# Patient Record
Sex: Female | Born: 1940 | Race: White | Hispanic: No | Smoking: Never smoker
Health system: Southern US, Community
[De-identification: ages and names within clinical notes are randomized; demographics above are authoritative.]

## PROBLEM LIST (undated history)

## (undated) DIAGNOSIS — B029 Zoster without complications: Secondary | ICD-10-CM

## (undated) DIAGNOSIS — M1710 Unilateral primary osteoarthritis, unspecified knee: Secondary | ICD-10-CM

## (undated) DIAGNOSIS — K649 Unspecified hemorrhoids: Secondary | ICD-10-CM

## (undated) DIAGNOSIS — J309 Allergic rhinitis, unspecified: Secondary | ICD-10-CM

## (undated) DIAGNOSIS — D126 Benign neoplasm of colon, unspecified: Secondary | ICD-10-CM

## (undated) DIAGNOSIS — F411 Generalized anxiety disorder: Secondary | ICD-10-CM

## (undated) DIAGNOSIS — H9190 Unspecified hearing loss, unspecified ear: Secondary | ICD-10-CM

## (undated) DIAGNOSIS — D649 Anemia, unspecified: Secondary | ICD-10-CM

## (undated) DIAGNOSIS — F0390 Unspecified dementia without behavioral disturbance: Secondary | ICD-10-CM

## (undated) DIAGNOSIS — F1010 Alcohol abuse, uncomplicated: Secondary | ICD-10-CM

## (undated) DIAGNOSIS — K5790 Diverticulosis of intestine, part unspecified, without perforation or abscess without bleeding: Secondary | ICD-10-CM

## (undated) DIAGNOSIS — E538 Deficiency of other specified B group vitamins: Secondary | ICD-10-CM

## (undated) DIAGNOSIS — E785 Hyperlipidemia, unspecified: Secondary | ICD-10-CM

## (undated) DIAGNOSIS — N6019 Diffuse cystic mastopathy of unspecified breast: Secondary | ICD-10-CM

## (undated) DIAGNOSIS — M353 Polymyalgia rheumatica: Secondary | ICD-10-CM

## (undated) DIAGNOSIS — I10 Essential (primary) hypertension: Secondary | ICD-10-CM

## (undated) DIAGNOSIS — M810 Age-related osteoporosis without current pathological fracture: Secondary | ICD-10-CM

## (undated) DIAGNOSIS — IMO0002 Reserved for concepts with insufficient information to code with codable children: Secondary | ICD-10-CM

## (undated) HISTORY — DX: Diverticulosis of intestine, part unspecified, without perforation or abscess without bleeding: K57.90

## (undated) HISTORY — DX: Unspecified dementia without behavioral disturbance: F03.90

## (undated) HISTORY — DX: Unilateral primary osteoarthritis, unspecified knee: M17.10

## (undated) HISTORY — DX: Benign neoplasm of colon, unspecified: D12.6

## (undated) HISTORY — DX: Deficiency of other specified B group vitamins: E53.8

## (undated) HISTORY — DX: Zoster without complications: B02.9

## (undated) HISTORY — PX: OTHER SURGICAL HISTORY: SHX169

## (undated) HISTORY — DX: Reserved for concepts with insufficient information to code with codable children: IMO0002

## (undated) HISTORY — DX: Generalized anxiety disorder: F41.1

## (undated) HISTORY — DX: Allergic rhinitis, unspecified: J30.9

## (undated) HISTORY — DX: Unspecified hemorrhoids: K64.9

## (undated) HISTORY — DX: Diffuse cystic mastopathy of unspecified breast: N60.19

## (undated) HISTORY — DX: Age-related osteoporosis without current pathological fracture: M81.0

## (undated) HISTORY — DX: Unspecified hearing loss, unspecified ear: H91.90

## (undated) HISTORY — DX: Hyperlipidemia, unspecified: E78.5

## (undated) HISTORY — DX: Polymyalgia rheumatica: M35.3

---

## 1991-07-19 HISTORY — PX: OTHER SURGICAL HISTORY: SHX169

## 1999-05-26 DIAGNOSIS — D126 Benign neoplasm of colon, unspecified: Secondary | ICD-10-CM

## 1999-05-26 HISTORY — DX: Benign neoplasm of colon, unspecified: D12.6

## 2002-02-18 ENCOUNTER — Other Ambulatory Visit: Admission: RE | Admit: 2002-02-18 | Discharge: 2002-02-18 | Payer: Self-pay | Admitting: Obstetrics and Gynecology

## 2005-04-01 ENCOUNTER — Other Ambulatory Visit: Admission: RE | Admit: 2005-04-01 | Discharge: 2005-04-01 | Payer: Self-pay | Admitting: Family Medicine

## 2005-04-15 ENCOUNTER — Ambulatory Visit: Payer: Self-pay | Admitting: Emergency Medicine

## 2005-04-23 ENCOUNTER — Ambulatory Visit: Payer: Self-pay | Admitting: Emergency Medicine

## 2005-05-19 ENCOUNTER — Ambulatory Visit: Payer: Self-pay | Admitting: Emergency Medicine

## 2005-05-24 ENCOUNTER — Ambulatory Visit: Payer: Self-pay | Admitting: Cardiology

## 2005-06-14 ENCOUNTER — Ambulatory Visit: Payer: Self-pay | Admitting: Emergency Medicine

## 2005-07-21 ENCOUNTER — Ambulatory Visit: Payer: Self-pay | Admitting: Emergency Medicine

## 2005-08-03 ENCOUNTER — Ambulatory Visit: Payer: Self-pay | Admitting: Emergency Medicine

## 2005-08-04 ENCOUNTER — Ambulatory Visit: Payer: Self-pay | Admitting: Cardiology

## 2005-08-29 ENCOUNTER — Ambulatory Visit: Payer: Self-pay | Admitting: Internal Medicine

## 2005-09-19 ENCOUNTER — Encounter: Admission: RE | Admit: 2005-09-19 | Discharge: 2005-09-19 | Payer: Self-pay | Admitting: Internal Medicine

## 2006-03-09 ENCOUNTER — Ambulatory Visit: Payer: Self-pay | Admitting: Emergency Medicine

## 2006-04-11 ENCOUNTER — Encounter (INDEPENDENT_AMBULATORY_CARE_PROVIDER_SITE_OTHER): Payer: Self-pay | Admitting: *Deleted

## 2006-04-11 ENCOUNTER — Ambulatory Visit (HOSPITAL_COMMUNITY): Admission: RE | Admit: 2006-04-11 | Discharge: 2006-04-11 | Payer: Self-pay | Admitting: Obstetrics and Gynecology

## 2006-05-02 ENCOUNTER — Other Ambulatory Visit: Admission: RE | Admit: 2006-05-02 | Discharge: 2006-05-02 | Payer: Self-pay | Admitting: Obstetrics and Gynecology

## 2007-02-14 ENCOUNTER — Ambulatory Visit: Payer: Self-pay | Admitting: Emergency Medicine

## 2007-04-06 DIAGNOSIS — J309 Allergic rhinitis, unspecified: Secondary | ICD-10-CM | POA: Insufficient documentation

## 2007-04-06 DIAGNOSIS — N6019 Diffuse cystic mastopathy of unspecified breast: Secondary | ICD-10-CM | POA: Insufficient documentation

## 2007-04-06 DIAGNOSIS — Z9889 Other specified postprocedural states: Secondary | ICD-10-CM | POA: Insufficient documentation

## 2007-04-06 DIAGNOSIS — M810 Age-related osteoporosis without current pathological fracture: Secondary | ICD-10-CM | POA: Insufficient documentation

## 2007-04-12 ENCOUNTER — Ambulatory Visit: Payer: Self-pay | Admitting: Emergency Medicine

## 2007-05-22 ENCOUNTER — Ambulatory Visit: Payer: Self-pay | Admitting: Pulmonary Disease

## 2007-09-07 ENCOUNTER — Ambulatory Visit: Payer: Self-pay | Admitting: Critical Care Medicine

## 2007-09-07 ENCOUNTER — Telehealth (INDEPENDENT_AMBULATORY_CARE_PROVIDER_SITE_OTHER): Payer: Self-pay | Admitting: *Deleted

## 2007-09-07 ENCOUNTER — Telehealth: Payer: Self-pay | Admitting: Emergency Medicine

## 2007-09-10 ENCOUNTER — Ambulatory Visit: Payer: Self-pay | Admitting: Emergency Medicine

## 2007-09-10 DIAGNOSIS — B029 Zoster without complications: Secondary | ICD-10-CM | POA: Insufficient documentation

## 2007-09-24 ENCOUNTER — Ambulatory Visit: Payer: Self-pay | Admitting: Emergency Medicine

## 2007-09-25 ENCOUNTER — Ambulatory Visit (HOSPITAL_COMMUNITY): Admission: RE | Admit: 2007-09-25 | Discharge: 2007-09-25 | Payer: Self-pay | Admitting: Emergency Medicine

## 2007-10-02 ENCOUNTER — Ambulatory Visit: Payer: Self-pay | Admitting: Emergency Medicine

## 2007-10-04 DIAGNOSIS — J209 Acute bronchitis, unspecified: Secondary | ICD-10-CM | POA: Insufficient documentation

## 2007-10-18 ENCOUNTER — Ambulatory Visit: Payer: Self-pay | Admitting: Emergency Medicine

## 2007-10-18 DIAGNOSIS — J479 Bronchiectasis, uncomplicated: Secondary | ICD-10-CM | POA: Insufficient documentation

## 2007-10-19 ENCOUNTER — Telehealth: Payer: Self-pay | Admitting: Emergency Medicine

## 2007-11-19 ENCOUNTER — Ambulatory Visit: Payer: Self-pay | Admitting: Emergency Medicine

## 2008-03-21 ENCOUNTER — Ambulatory Visit: Payer: Self-pay | Admitting: Emergency Medicine

## 2008-04-16 ENCOUNTER — Telehealth (INDEPENDENT_AMBULATORY_CARE_PROVIDER_SITE_OTHER): Payer: Self-pay | Admitting: *Deleted

## 2009-03-06 ENCOUNTER — Encounter (INDEPENDENT_AMBULATORY_CARE_PROVIDER_SITE_OTHER): Payer: Self-pay | Admitting: *Deleted

## 2009-06-15 ENCOUNTER — Ambulatory Visit: Payer: Self-pay | Admitting: Emergency Medicine

## 2009-06-19 ENCOUNTER — Telehealth: Payer: Self-pay | Admitting: Internal Medicine

## 2009-06-23 ENCOUNTER — Telehealth: Payer: Self-pay | Admitting: Emergency Medicine

## 2009-06-30 ENCOUNTER — Ambulatory Visit: Payer: Self-pay | Admitting: Emergency Medicine

## 2009-06-30 DIAGNOSIS — J069 Acute upper respiratory infection, unspecified: Secondary | ICD-10-CM | POA: Insufficient documentation

## 2009-08-20 ENCOUNTER — Ambulatory Visit: Payer: Self-pay | Admitting: Internal Medicine

## 2009-08-20 DIAGNOSIS — IMO0002 Reserved for concepts with insufficient information to code with codable children: Secondary | ICD-10-CM | POA: Insufficient documentation

## 2009-08-20 DIAGNOSIS — M1710 Unilateral primary osteoarthritis, unspecified knee: Secondary | ICD-10-CM

## 2009-08-20 DIAGNOSIS — H9190 Unspecified hearing loss, unspecified ear: Secondary | ICD-10-CM | POA: Insufficient documentation

## 2009-08-22 ENCOUNTER — Ambulatory Visit: Payer: Self-pay | Admitting: Family Medicine

## 2009-08-22 DIAGNOSIS — T887XXA Unspecified adverse effect of drug or medicament, initial encounter: Secondary | ICD-10-CM | POA: Insufficient documentation

## 2009-11-19 ENCOUNTER — Ambulatory Visit: Payer: Self-pay | Admitting: Internal Medicine

## 2009-11-19 DIAGNOSIS — L910 Hypertrophic scar: Secondary | ICD-10-CM | POA: Insufficient documentation

## 2009-11-20 LAB — CONVERTED CEMR LAB
ALT: 17 units/L (ref 0–35)
AST: 19 units/L (ref 0–37)
Albumin: 4 g/dL (ref 3.5–5.2)
Alkaline Phosphatase: 65 units/L (ref 39–117)
BUN: 11 mg/dL (ref 6–23)
Basophils Absolute: 0.1 10*3/uL (ref 0.0–0.1)
Bilirubin Urine: NEGATIVE
Bilirubin, Direct: 0.1 mg/dL (ref 0.0–0.3)
CO2: 31 meq/L (ref 19–32)
Calcium: 9.3 mg/dL (ref 8.4–10.5)
Chloride: 102 meq/L (ref 96–112)
Cholesterol: 314 mg/dL — ABNORMAL HIGH (ref 0–200)
Creatinine, Ser: 0.6 mg/dL (ref 0.4–1.2)
Direct LDL: 203.1 mg/dL
Eosinophils Absolute: 0.2 10*3/uL (ref 0.0–0.7)
Eosinophils Relative: 4.2 % (ref 0.0–5.0)
GFR calc non Af Amer: 105.94 mL/min (ref >60)
Glucose, Bld: 115 mg/dL — ABNORMAL HIGH (ref 70–99)
HCT: 39 % (ref 36.0–46.0)
HDL: 98.7 mg/dL (ref >39.00)
Hemoglobin: 13.7 g/dL (ref 12.0–15.0)
Ketones, ur: NEGATIVE mg/dL
Lymphs Abs: 1.4 10*3/uL (ref 0.7–4.0)
MCV: 87.4 fL (ref 78.0–100.0)
Monocytes Absolute: 0.3 10*3/uL (ref 0.1–1.0)
Monocytes Relative: 7.1 % (ref 3.0–12.0)
Neutro Abs: 2.6 10*3/uL (ref 1.4–7.7)
Neutrophils Relative %: 56.7 % (ref 43.0–77.0)
Nitrite: NEGATIVE
Platelets: 378 10*3/uL (ref 150.0–400.0)
Potassium: 4.7 meq/L (ref 3.5–5.1)
RDW: 14.4 % (ref 11.5–14.6)
Sodium: 141 meq/L (ref 135–145)
Specific Gravity, Urine: 1.01 (ref 1.000–1.030)
TSH: 1.74 microintl units/mL (ref 0.35–5.50)
Total Bilirubin: 0.6 mg/dL (ref 0.3–1.2)
Total CHOL/HDL Ratio: 3
Total Protein, Urine: NEGATIVE mg/dL
Total Protein: 7.2 g/dL (ref 6.0–8.3)
Triglycerides: 65 mg/dL (ref 0.0–149.0)
VLDL: 13 mg/dL (ref 0.0–40.0)
WBC: 4.6 10*3/uL (ref 4.5–10.5)
pH: 7 (ref 5.0–8.0)

## 2009-12-01 ENCOUNTER — Telehealth (INDEPENDENT_AMBULATORY_CARE_PROVIDER_SITE_OTHER): Payer: Self-pay | Admitting: *Deleted

## 2009-12-08 ENCOUNTER — Ambulatory Visit: Payer: Self-pay | Admitting: Internal Medicine

## 2009-12-08 ENCOUNTER — Telehealth: Payer: Self-pay | Admitting: Internal Medicine

## 2009-12-08 DIAGNOSIS — E785 Hyperlipidemia, unspecified: Secondary | ICD-10-CM | POA: Insufficient documentation

## 2009-12-18 ENCOUNTER — Encounter: Payer: Self-pay | Admitting: Internal Medicine

## 2010-03-02 ENCOUNTER — Ambulatory Visit: Payer: Self-pay | Admitting: Internal Medicine

## 2010-03-02 DIAGNOSIS — K140 Glossitis: Secondary | ICD-10-CM | POA: Insufficient documentation

## 2010-03-02 LAB — CONVERTED CEMR LAB
Cholesterol: 290 mg/dL — ABNORMAL HIGH (ref 0–200)
Triglycerides: 67 mg/dL (ref 0.0–149.0)
VLDL: 13.4 mg/dL (ref 0.0–40.0)

## 2010-03-12 ENCOUNTER — Encounter: Payer: Self-pay | Admitting: Internal Medicine

## 2010-04-09 ENCOUNTER — Ambulatory Visit: Payer: Self-pay | Admitting: Internal Medicine

## 2010-04-09 DIAGNOSIS — N390 Urinary tract infection, site not specified: Secondary | ICD-10-CM | POA: Insufficient documentation

## 2010-04-09 LAB — CONVERTED CEMR LAB
Bilirubin Urine: NEGATIVE
Nitrite: POSITIVE
Protein, U semiquant: NEGATIVE

## 2010-04-13 ENCOUNTER — Telehealth: Payer: Self-pay | Admitting: Internal Medicine

## 2010-08-17 NOTE — Assessment & Plan Note (Signed)
Summary: knot on r leg/cd   Vital Signs:  Patient profile:   70 year old female Height:      67 inches (170.18 cm) Weight:      160.12 pounds (72.78 kg) O2 Sat:      96 % on Room air Temp:     98.2 degrees F (36.78 degrees C) oral Pulse rate:   70 / minute BP sitting:   120 / 82  (left arm) Cuff size:   regular  Vitals Entered By: Orlan Leavens (Dec 08, 2009 11:32 AM)  O2 Flow:  Room air CC: Knot on (R) leg Is Patient Diabetic? No Pain Assessment Patient in pain? no        Primary Care Provider:  Newt Lukes MD  CC:  Knot on (R) leg.  History of Present Illness: wants to review labs -  1) dyslipidemia - pt clarifies has been recommended to be on lipitor in past but never taken this or any statin med - recent labs reviewed including calc CHD risk - taking fish oil two times a day   2) arthritis -knee L>R still with swelling and pain L>R knee, esp while sitting prolnged car trip pain controlled with ASA as needed - improved when remembers to wear neoprene sleeve noted 2 days ago "knot" to lateral side of right knee after extertion - not red or painful, no injury - after night rest and sleep, "knot has resolved"  3) concerned re: new keloid formation and tenderness over left breast scar silicone breast implants removed almost 16yr ago redness and pain over scar new in past few weeks no fever, no drainage would like to be seen by breast specialist at [facility]  4) 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 in many mos - not needing rescue inhaler - follows with pulm- [facility] for same -annually  Clinical Review Panels:  Lipid Management   Cholesterol:  314 (11/19/2009)   HDL (good cholesterol):  98.70 (11/19/2009)   Current Medications (verified): 1)  Claritin 10 Mg  Tabs (Loratadine) .... As Needed 2)  Proventil Hfa 108 (90 Base) Mcg/act Aers (Albuterol Sulfate) .... 2 Puff Q4-6 Hours As Needed 3)  Mucinex Dm 30-600 Mg  Tb12 (Dextromethorphan-Guaifenesin) .Marland Kitchen.. 1-2  Tabs Every 12 Hours As Needed 4)  Aspirin 81 Mg Tabs (Aspirin) .... 2 By Mouth Once Daily 5)  Fish Oil 1200 Mg Caps (Omega-3 Fatty Acids) .Marland Kitchen.. 1 By Mouth Two Times A Day  Allergies (verified): 1)  ! Penicillin V Potassium (Penicillin V Potassium) 2)  ! Sulfazine (Sulfasalazine) 3)  ! * Pneumonia Vaccine  Past History:  Past Medical History: HERPES ZOSTER hx   BRONCHIECTASIS  HERNIORRHAPHY, HX OF  OSTEOPENIA  ALLERGIC RHINITIS Osteoarthritis, knees L>R   MD rooster: pulm - [facility]     Past Surgical History: bilaterally mastectomy while removing cosmetic silicone implants (NO breast cancer!)  bilateral silicone breast explant due to rupture/leak - 1993 at [facility]  Review of Systems  The patient denies fever, weight loss, chest pain, and headaches.    Physical Exam  General:  Well-developed,well-nourished,in no acute distress; alert,appropriate and cooperative throughout examination Breasts:  new keloid - bright red and tender to palp over lateral edge of left breast scar 6.5cm in length- no cellulitis, no mass, no fluctuence or drainage (B mastectomy) - remaining scar without inglammation on left side (midline medially) Lungs:  normal respiratory effort, no intercostal retractions or use of accessory muscles; normal breath sounds bilaterally - no crackles and  no wheezes.    Heart:  normal rate, regular rhythm, no murmur, and no rub. BLE without edema. Msk:  Left knee: decreased range of motion, diffuse boggy synovitis. Tender to palpation on joint line. Increased pain with weight bearing. Positive crepitus. right knee without palp knot or mass - FROM without reproduction of problem   Impression & Recommendations:  Problem # 1:  KELOID (ICD-701.4)  ?delayed silicone reaction (explant in 1993 but new keloid formation only 2-3 weeks ago) refer to breast surg for further eval - no evidence for cellulitis or active infx on exam referral to gen surg was cancelled by gen  surg on day prior to sched appt as felt pt should see plastic surg - plastics referral done, apt was for 12/16/09 with [facility]--- but pt has cancelled this and wishes to be seen at [facility] instead  addendum - see phone note - pt requests dr. Jamey Ripa -- i have called and discussed case directly with him - he agrees to further eval for same and will have his office arranged  Problem # 2:  DYSLIPIDEMIA (ICD-272.4)  labs and CHD risk reviewed - very high chol and LDL but high HDL and low TGs - calc to be less than avg risk for CHD depsite the high LDL/total - now on fish oil but would be willing to take lipitor if not improving - check FLP again 3 mos and initiate tx with lipitor if total/LDL not improved also to focus on diet  exercise in meanwhile - Time spent with patient 25 minutes, more than 50% of this time was spent counseling patient on surgical referral and mgmt of dyslipidemia   Labs Reviewed: SGOT: 19 (11/19/2009)   SGPT: 17 (11/19/2009)   HDL:98.70 (11/19/2009)  Chol:314 (11/19/2009)  Trig:65.0 (11/19/2009)  Complete Medication List: 1)  Claritin 10 Mg Tabs (Loratadine) .... As needed 2)  Proventil Hfa 108 (90 Base) Mcg/act Aers (Albuterol sulfate) .... 2 puff q4-6 hours as needed 3)  Mucinex Dm 30-600 Mg Tb12 (Dextromethorphan-guaifenesin) .Marland Kitchen.. 1-2 tabs every 12 hours as needed 4)  Aspirin 81 Mg Tabs (Aspirin) .... 2 by mouth once daily 5)  Fish Oil 1200 Mg Caps (Omega-3 fatty acids) .Marland Kitchen.. 1 by mouth two times a day  Patient Instructions: 1)  it was good to see you today.  2)  labs reviewed - will recheck cholesterol in mid-august - Please schedule a follow-up appointment at that time, sooner if problems. 3)  we'll make referral to [facility] for breast evaluation once we have reviewed our options. Our office will contact you regarding this appointment once made.

## 2010-08-17 NOTE — Therapy (Signed)
Summary: [facility]  [facility]   Imported By: Lennie Odor 03/25/2010 11:14:52  _____________________________________________________________________  External Attachment:    Type:   Image     Comment:   External Document

## 2010-08-17 NOTE — Consult Note (Signed)
Summary: [facility]   Imported By: Sherian Rein 12/30/2009 11:55:24  _____________________________________________________________________  External Attachment:    Type:   Image     Comment:   External Document

## 2010-08-17 NOTE — Assessment & Plan Note (Signed)
Summary: REACTION FROM PNEUMONIA SHOT/DLO   Vital Signs:  Patient profile:   70 year old female Weight:      160 pounds Temp:     97.7 degrees F oral BP sitting:   154 / 90  (right arm) Cuff size:   regular  Vitals Entered By: Alfred Levins, CMA (August 22, 2009 11:29 AM) CC: pt had pneumonia shot on thursday and her lt arm is red, swollen, and sore at injection area   History of Present Illness: Here for a reaction to a Pneumovax she received here 48 hours ago.  This was placed in the left upper arm. Last night she noticed some slight discomfort and redness in the left upper arm, and this area has increased in size this am. Interestingly this red area is a good 10 cm further down the arm distal to the injeciton site. She feels fine in general, no fever, etc.   Allergies: 1)  ! Penicillin V Potassium (Penicillin V Potassium) 2)  ! Sulfazine (Sulfasalazine) 3)  ! * Pneumonia Vaccine  Past History:  Past Medical History: Reviewed history from 08/20/2009 and no changes required. HERPES ZOSTER hx BRONCHIECTASIS  HERNIORRHAPHY, HX OF  OSTEOPENIA  ALLERGIC RHINITIS Osteoarthritis, knees L>R  MD rooster: pulm - [facility]    Review of Systems  The patient denies anorexia, fever, weight loss, weight gain, vision loss, decreased hearing, hoarseness, chest pain, syncope, dyspnea on exertion, peripheral edema, prolonged cough, headaches, hemoptysis, abdominal pain, melena, hematochezia, severe indigestion/heartburn, hematuria, incontinence, genital sores, muscle weakness, suspicious skin lesions, transient blindness, difficulty walking, depression, unusual weight change, abnormal bleeding, enlarged lymph nodes, angioedema, breast masses, and testicular masses.    Physical Exam  General:  Well-developed,well-nourished,in no acute distress; alert,appropriate and cooperative throughout examination Skin:  the left upper arm has an area of swelling, erythema, warmth, and tenderness just  above the elbow. The deltoid region is unaffected.  Axillary Nodes:  No palpable lymphadenopathy   Impression & Recommendations:  Problem # 1:  ADVERSE DRUG REACTION (ICD-995.20)  Complete Medication List: 1)  Claritin 10 Mg Tabs (Loratadine) .... As needed 2)  Proventil Hfa 108 (90 Base) Mcg/act Aers (Albuterol sulfate) .... 2 puff q4-6 hours as needed 3)  Mucinex Dm 30-600 Mg Tb12 (Dextromethorphan-guaifenesin) .Marland Kitchen.. 1-2 tabs every 12 hours as needed 4)  Aspirin 81 Mg Tabs (Aspirin) .... 2 by mouth once daily 5)  Doxycycline Hyclate 100 Mg Caps (Doxycycline hyclate) .... Two times a day  Patient Instructions: 1)  This seems to be an allergic reaction to the pneumonia vaccine (she has never had one before), but the location of the reaction indicates that the vaccine must have tracked down the arm from the injection site, possibly in between some muscle fiber layers. Advised her to take Benadryl and apply ice packs to the area. I don not think it is infected, but since this is a weekend i gave her a rx for Doxycycline to take home and hold onto. If the red area seems to increasing in size at all, she will begin taking the antibiotic. She will call if she has any questions.  Prescriptions: DOXYCYCLINE HYCLATE 100 MG CAPS (DOXYCYCLINE HYCLATE) two times a day  #14 x 0   Entered and Authorized by:   Nelwyn Salisbury MD   Signed by:   Nelwyn Salisbury MD on 08/22/2009   Method used:   Print then Give to Patient   RxID:   [id number]

## 2010-08-17 NOTE — Progress Notes (Signed)
Summary: UTI?  Phone Note Call from Patient Call back at [facility] Phone [phone]   Caller: Patient Summary of Call: Pt called stating that UTI sxs have improved but not resolved. Pt is requesting additional ABX. Would it be okay to refill Cipro? Initial call taken by: Margaret Pyle, CMA,  April 13, 2010 1:30 PM  Follow-up for Phone Call        yes - rx as before Follow-up by: Newt Lukes MD,  April 13, 2010 3:13 PM  Additional Follow-up for Phone Call Additional follow up Details #1::        Pt informed Additional Follow-up by: Margaret Pyle, CMA,  April 13, 2010 3:16 PM    Prescriptions: CIPROFLOXACIN HCL 500 MG TABS (CIPROFLOXACIN HCL) 1 by mouth two times a day x 5 days  #10 x 0   Entered by:   Margaret Pyle, CMA   Authorized by:   Newt Lukes MD   Signed by:   Margaret Pyle, CMA on 04/13/2010   Method used:   Electron[facility] to        [facility]  [phone]* (retail)       [address] [place], [place]  [address]       Ph: [phone] or [phone]       Fax: [phone]   RxID:   [id number]

## 2010-08-17 NOTE — Assessment & Plan Note (Signed)
Summary: uti per dahlia-stc   Vital Signs:  Patient profile:   70 year old female Height:      67 inches (170.18 cm) Weight:      153.8 pounds (69.91 kg) BMI:     24.18 O2 Sat:      95 % on Room air Temp:     98.4 degrees F (36.89 degrees C) oral Pulse rate:   64 / minute BP sitting:   130 / 78  (left arm) Cuff size:   regular  Vitals Entered By: Orlan Leavens RMA (April 09, 2010 1:31 PM)  O2 Flow:  Room air CC: ? UTI Is Patient Diabetic? No Pain Assessment Patient in pain? yes      Type: discomfort when urination Comments Also c/o (L) side pain   Primary Care Provider:  Newt Lukes MD  CC:  ? UTI.  History of Present Illness: c/o dysuria - onset 1 week ago +odor, freq small vol urination +hx same but not in many years no gross hematuria - +left abd discomfort - onset same time as bladder symptoms  symptoms improved x 2 d while on otc azo but then returned  also reviewed prior chronic med issues dyslipidemia - has never been on med tx for same - follows low fat diet and exercises regularly - also uses fish oil - has carefully considered options and agrees to try med rx if still high flp  also notes ++stress with spouse in past 2 weeks - in marital counseling - reports she is handling stress well "on the outside" and declines needs for meds has support of friends and church-  also reviewed prior med issues: 1) arthritis -knee L>R still with swelling and pain L>R knee, esp while sitting prolnged car trip pain controlled with ASA as needed - improved when remembers to wear neoprene sleeve  2)keloid formation and tenderness over left breast scar silicone breast implants removed almost 67yr ago redness and pain over scar around 11/2009 no fever, no drainage saw surg 12/18/09 with bx: keloid, no cancer (path reviewed by me today)  3) 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 in many mos - not needing rescue inhaler - follows with pulm- [facility] for same  -annually   Current Medications (verified): 1)  Claritin 10 Mg  Tabs (Loratadine) .... As Needed 2)  Proventil Hfa 108 (90 Base) Mcg/act Aers (Albuterol Sulfate) .... 2 Puff Q4-6 Hours As Needed 3)  Mucinex Dm 30-600 Mg  Tb12 (Dextromethorphan-Guaifenesin) .Marland Kitchen.. 1-2 Tabs Every 12 Hours As Needed 4)  Aspirin 81 Mg Tabs (Aspirin) .... 2 By Mouth Once Daily 5)  Fish Oil 1200 Mg Caps (Omega-3 Fatty Acids) .Marland Kitchen.. 1 By Mouth Two Times A Day 6)  Nystatin 100000 Unit/ml Susp (Nystatin) .... 5-10cc By Mouth Swish & Spit Four Times A Day As Needed 7)  Pravastatin Sodium 20 Mg Tabs (Pravastatin Sodium) .Marland Kitchen.. 1 By Mouth At Bedtime  Allergies (verified): 1)  ! Penicillin V Potassium (Penicillin V Potassium) 2)  ! Sulfazine (Sulfasalazine) 3)  ! * Pneumonia Vaccine  Past History:  Past Medical History: HERPES ZOSTER hx   BRONCHIECTASIS  HERNIORRHAPHY, HX OF  OSTEOPENIA  ALLERGIC RHINITIS Osteoarthritis, knees L>R  Dyslipidemia    MD roster: pulm - [facility]     Review of Systems  The patient denies anorexia, fever, chest pain, headaches, and severe indigestion/heartburn.    Physical Exam  General:  Well-developed,well-nourished,in no acute distress; alert,appropriate and cooperative throughout examination Lungs:  normal respiratory effort, no  intercostal retractions or use of accessory muscles; normal breath sounds bilaterally - no crackles and no wheezes.    Heart:  normal rate, regular rhythm, no murmur, and no rub. BLE without edema. Abdomen:  soft, non-tender, normal bowel sounds, no distention; no masses and no appreciable hepatomegaly or splenomegaly.     Impression & Recommendations:  Problem # 1:  UTI (ICD-599.0)  +Udip with classic symptoms  tx cipro x 5 days - to ROV if left abd pain not improved with tx of UTI Her updated medication list for this problem includes:    Ciprofloxacin Hcl 500 Mg Tabs (Ciprofloxacin hcl) .Marland Kitchen... 1 by mouth two times a day x 5 days  Orders: UA  Dipstick w/o Micro (manual) ([id number]) Prescription Created Electronically [id number])  Encouraged to push clear liquids, get enough rest, and take acetaminophen as needed. To be seen in 10 days if no improvement, sooner if worse.  Complete Medication List: 1)  Claritin 10 Mg Tabs (Loratadine) .... As needed 2)  Proventil Hfa 108 (90 Base) Mcg/act Aers (Albuterol sulfate) .... 2 puff q4-6 hours as needed 3)  Mucinex Dm 30-600 Mg Tb12 (Dextromethorphan-guaifenesin) .Marland Kitchen.. 1-2 tabs every 12 hours as needed 4)  Aspirin 81 Mg Tabs (Aspirin) .... 2 by mouth once daily 5)  Fish Oil 1200 Mg Caps (Omega-3 fatty acids) .Marland Kitchen.. 1 by mouth two times a day 6)  Nystatin 100000 Unit/ml Susp (Nystatin) .... 5-10cc by mouth swish & spit four times a day as needed 7)  Pravastatin Sodium 20 Mg Tabs (Pravastatin sodium) .Marland Kitchen.. 1 by mouth at bedtime 8)  Ciprofloxacin Hcl 500 Mg Tabs (Ciprofloxacin hcl) .Marland Kitchen.. 1 by mouth two times a day x 5 days  Patient Instructions: 1)  it was good to see you today. 2)  cipro for your bladder infection - your prescriptions have been electronically submitted to your pharmacy. Please take as directed. Contact our office if you believe you're having problems with the medication(s).  3)  Get plenty of rest, drink lots of clear liquids, and use Tylenol or Ibuprofen for fever and comfort. Return in 7-10 days if you're not better:sooner if you're feeling worse. Prescriptions: CIPROFLOXACIN HCL 500 MG TABS (CIPROFLOXACIN HCL) 1 by mouth two times a day x 5 days  #10 x 0   Entered and Authorized by:   Newt Lukes MD   Signed by:   Newt Lukes MD on 04/09/2010   Method used:   [facility]        [facility]  [address]  [phone][facility] (retail)       [address] [place], [place]  [address]       Ph: [phone] or [phone]       Fax: [phone]   RxID:   [id number]   Laboratory Results   Urine Tests    Routine Urinalysis   Color: lt. yellow Appearance:  Clear Glucose: negative   (Normal Range: Negative) Bilirubin: negative   (Normal Range: Negative) Ketone: negative   (Normal Range: Negative) Spec. Gravity: <1.005   (Normal Range: 1.003-1.035) Blood: moderate   (Normal Range: Negative) pH: 5.0   (Normal Range: 5.0-8.0) Protein: negative   (Normal Range: Negative) Urobilinogen: 0.2   (Normal Range: 0-1) Nitrite: positive   (Normal Range: Negative) Leukocyte Esterace: moderate   (Normal Range: Negative)

## 2010-08-17 NOTE — Progress Notes (Signed)
Summary: [facility] Referral  Phone Note From Other Clinic   Caller: Referral Coordinator Action Taken: Provider Notified Summary of Call: Dr Felicity Coyer -[facility] scheduled appt for pt with Dr Andrey Campanile on 12/04/09, the called and said that Dr Andrey Campanile said pt nned to see a plastic surgeon. Please advise.  Thanks Initial call taken by: Dagoberto Reef,  Dec 01, 2009 3:49 PM  Follow-up for Phone Call        ok - new referal made to plastic surg per dr. Andrey Campanile - i would like this scheduled sooner than not -  Follow-up by: Newt Lukes MD,  Dec 01, 2009 3:58 PM  Additional Follow-up for Phone Call Additional follow up Details #1::        Appt-Dr Shon Hough [facility] 12/16/09 @ 10:15am, PT AWARE .  Additional Follow-up by: Dagoberto Reef,  Dec 01, 2009 4:40 PM

## 2010-08-17 NOTE — Assessment & Plan Note (Signed)
Summary: 3-4 MTH FU--STC   Vital Signs:  Patient profile:   70 year old female Height:      67 inches (170.18 cm) Weight:      159.12 pounds (72.33 kg) O2 Sat:      94 % on Room air Temp:     98.2 degrees F (36.78 degrees C) oral Pulse rate:   58 / minute BP sitting:   128 / 72  (left arm) Cuff size:   regular  Vitals Entered By: Orlan Leavens (Nov 19, 2009 9:11 AM)  O2 Flow:  Room air CC: 3 month follow-up Is Patient Diabetic? No Pain Assessment Patient in pain? no        Primary Care Provider:  Newt Lukes MD  CC:  3 month follow-up.  History of Present Illness: patient is here today for annual physical. Patient feels well and has no complaints. fasting for labs  also review of other medical issues: 1) arthritis -knee L>R still with swelling and pain L>R knee, esp while sitting prolnged car trip pain controlled with ASA as needed - improved when remembers to wear neoprene sleeve  2) concerned re: new keloid formation and tenderness over left breast scar silicone breast implants removed almost 67yr ago redness and pain over scar new in past 2-3 weeks no fever, no drainage  3) 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 in many mos - not needing rescue inhaler - follows with pulm- [facility] for same -annually  Preventive Screening-Counseling & Management  Alcohol-Tobacco     Alcohol drinks/day: 1     Alcohol type: wine     Alcohol Counseling: not indicated; use of alcohol is not excessive or problematic     Smoking Status: never     Tobacco Counseling: not indicated; no tobacco use  Caffeine-Diet-Exercise     Diet Counseling: not indicated; diet is assessed to be healthy     Does Patient Exercise: yes     Exercise Counseling: not indicated; exercise is adequate     Depression Counseling: not indicated; screening negative for depression  Safety-Violence-Falls     Seat Belt Counseling: not indicated; patient wears seat belts     Firearm Counseling: not  applicable     Violence in the Home: no risk noted     Fall Risk Counseling: not indicated; no significant falls noted  Clinical Review Panels:  Prevention   Last Colonoscopy:  Location:  [facility].  Results: Hemorrhoids.     Results: Diverticulosis.        (04/15/2004)  Immunizations   Last Tetanus Booster:  Historical (07/18/2004)   Last Flu Vaccine:  Historical (04/17/2009)   Last Pneumovax:  Pneumovax (Medicare) (08/20/2009)   Current Medications (verified): 1)  Claritin 10 Mg  Tabs (Loratadine) .... As Needed 2)  Proventil Hfa 108 (90 Base) Mcg/act Aers (Albuterol Sulfate) .... 2 Puff Q4-6 Hours As Needed 3)  Mucinex Dm 30-600 Mg  Tb12 (Dextromethorphan-Guaifenesin) .Marland Kitchen.. 1-2 Tabs Every 12 Hours As Needed 4)  Aspirin 81 Mg Tabs (Aspirin) .... 2 By Mouth Once Daily  Allergies (verified): 1)  ! Penicillin V Potassium (Penicillin V Potassium) 2)  ! Sulfazine (Sulfasalazine) 3)  ! * Pneumonia Vaccine  Past History:  Past medical, surgical, family and social histories (including risk factors) reviewed, and no changes noted (except as noted below).  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 bilaterally mastectomy, 20 years ago silicone breast explant - 1993  Family History: Reviewed history from 09/07/2007 and no changes required. pancreatic Ca father died young  Social History: Reviewed history from 08/20/2009 and no changes required. Never Smoked occassional white wine married, lives with spouse 22yo adopted son nearby retired from public health -nutritionist   Review of Systems  The patient denies weight loss, hoarseness, dyspnea on exertion, and difficulty walking.         also, see HPI above. I have reviewed all other systems and they  were negative.   Physical Exam  General:  Well-developed,well-nourished,in no acute distress; alert,appropriate and cooperative throughout examination Eyes:  vision grossly intact; pupils equal, round and reactive to light.  conjunctiva and lids normal.   wears glasses Ears:  normal pinnae bilaterally, without erythema, swelling, or tenderness to palpation. TMs clear, without effusion, or cerumen impaction. Hearing mildly diminished bilaterally  Mouth:  teeth and gums in good repair; mucous membranes moist, without lesions or ulcers. oropharynx clear without exudate, no erythema.  Breasts:  new keloid - bright red and tender to aplp over left breast scar - no cellulitis, no mass, no fluctuence or drainage (B mastectomy) Lungs:  normal respiratory effort, no intercostal retractions or use of accessory muscles; normal breath sounds bilaterally - no crackles and no wheezes.    Heart:  normal rate, regular rhythm, no murmur, and no rub. BLE without edema. Abdomen:  soft, non-tender, normal bowel sounds, no distention; no masses and no appreciable hepatomegaly or splenomegaly.   Genitalia:  defer to gyn Msk:  ?mild scoli thoracic spine- prominant right scapular region as compared to left - no pain -  Left knee: decreased range of motion, diffuse boggy synovitis. Tender to palpation on joint line. Increased pain with weight bearing. Positive crepitus.  Neurologic:  alert & oriented X3 and cranial nerves II-XII symetrically intact.  strength normal in all extremities, sensation intact to light touch, and gait normal. speech fluent without dysarthria or aphasia; follows commands with good comprehension.  Skin:  see breast above re: left keloid - otherwise, no rashes, vesicles, ulcers, or erythema. No nodules or irregularity to palpation.   Psych:  Oriented X3, memory intact for recent and remote, normally interactive, good eye contact, not anxious appearing, not depressed appearing, and not agitated.   very  pleasant   Impression & Recommendations:  Problem # 1:  PREVENTIVE HEALTH CARE (ICD-V70.0) Patient has been counseled on age-appropriate routine health concerns for screening and prevention. These are reviewed and up-to-date. Immunizations are up-to-date or declined. Labs ordered and will be reviewed.  Orders: TLB-Lipid Panel (80061-LIPID) TLB-BMP (Basic Metabolic Panel-BMET) (80048-METABOL) TLB-CBC Platelet - w/Differential (85025-CBCD) TLB-Hepatic/Liver Function Pnl (80076-HEPATIC) TLB-TSH (Thyroid Stimulating Hormone) (84443-TSH) TLB-Udip ONLY (81003-UDIP)  Problem # 2:  KELOID (ICD-701.4) ?delayed silicone reaction (explant in 1993 but new keloid formation only 2-3 weeks ago) refer to breast surg for further eval - no evidence for cellulitis or active infx on exam Orders: Surgical Referral (Surgery)  Problem # 3:  DEGENERATIVE JOINT DISEASE, KNEES, BILATERAL (ICD-715.96) L>R knee symptoms  cont conserv mgmt with tylenol, aleve, fish oil and neoprene sleeve - pt to notify us when ready for referral to ortho for further eval and tx Her updated medication list for this problem includes:    Aspirin 81 Mg Tabs (Aspirin) .Marland Kitchen... 2 by mouth once daily  Problem # 4:  OSTEOPENIA (ICD-733.90)  bone scan results  reviewed - cont Ca + D  Discussed medication use, applications of heat or ice, and exercises.   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  follows with pulm annually as needed   Complete Medication List: 1)  Claritin 10 Mg Tabs (Loratadine) .... As needed 2)  Proventil Hfa 108 (90 Base) Mcg/act Aers (Albuterol sulfate) .... 2 puff q4-6 hours as needed 3)  Mucinex Dm 30-600 Mg Tb12 (Dextromethorphan-guaifenesin) .Marland Kitchen.. 1-2 tabs every 12 hours as needed 4)  Aspirin 81 Mg Tabs (Aspirin) .... 2 by mouth once daily 5)  Fish Oil 1200 Mg Caps (Omega-3 fatty acids) .Marland Kitchen.. 1 by mouth two times a  day  Patient Instructions: 1)  it was good to see you today. 2)  test(s) ordered today - your results will be posted on the phone tree for review in 48-72 hours from the time of test completion; call [phone] and enter your 9 digit MRN (listed above on this page, just below your name); if any changes need to be made or there are abnormal results, you will be contacted directly.  3)  we'll make referral to breast surgeon for evaluation of the new keloid over left side. Our office will contact you regarding this appointment once made.  4)  If your left knee becomes worse, let us know and will we make orthopedic referral for eval and treatment as needed - continue neoprene sleeve - 5)  add fish oil supplements as below for cholesterol and antiinflammatory effects 6)  Please schedule a follow-up appointment in 6-12 months, sooner if problems.

## 2010-08-17 NOTE — Assessment & Plan Note (Signed)
Summary: f/u appt/#/cd   Vital Signs:  Patient profile:   70 year old female Height:      67 inches (170.18 cm) Weight:      153.0 pounds (69.55 kg) O2 Sat:      96 % on Room air Temp:     98.3 degrees F (36.83 degrees C) oral Pulse rate:   59 / minute BP sitting:   112 / 88  (left arm) Cuff size:   regular  Vitals Entered By: Orlan Leavens RMA (March 02, 2010 9:17 AM)  O2 Flow:  Room air CC: follow-up visit Is Patient Diabetic? No Pain Assessment Patient in pain? no        Primary Care Provider:  Newt Lukes MD  CC:  follow-up visit.  History of Present Illness:  dyslipidemia - has never been on med tx for same - follows low fat diet and exercises regularly - also uses fish oil - has carefully considered options and agrees to try med rx if still high flp  also notes ++stress with spouse in past 2 weeks - in marital counseling - reports she is handling stress well "on the outside" and declines needs for meds has support of friends and church-  also reviewed prior med issues: 1) arthritis -knee L>R still with swelling and pain L>R knee, esp while sitting prolnged car trip pain controlled with ASA as needed - improved when remembers to wear neoprene sleeve  2) new keloid formation and tenderness over left breast scar silicone breast implants removed almost 1yr ago redness and pain over scar around 11/2009 no fever, no drainage saw surg 12/18/09 with bx: keloid, no cancer (path reviewed by me today)  3) 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 in many mos - not needing rescue inhaler - follows with pulm- [facility] for same -annually   Clinical Review Panels:  Immunizations   Last Tetanus Booster:  Historical (07/18/2004)   Last Flu Vaccine:  Historical (04/17/2009)   Last Pneumovax:  Pneumovax (Medicare) (08/20/2009)  Lipid Management   Cholesterol:  314 (11/19/2009)   HDL (good cholesterol):  98.70 (11/19/2009)  CBC   WBC:  4.6 (11/19/2009)   RBC:   4.47 (11/19/2009)   Hgb:  13.7 (11/19/2009)   Hct:  39.0 (11/19/2009)   Platelets:  378.0 (11/19/2009)   MCV  87.4 (11/19/2009)   MCHC  35.0 (11/19/2009)   RDW  14.4 (11/19/2009)   PMN:  56.7 (11/19/2009)   Lymphs:  30.3 (11/19/2009)   Monos:  7.1 (11/19/2009)   Eosinophils:  4.2 (11/19/2009)   Basophil:  1.7 (11/19/2009)  Complete Metabolic Panel   Glucose:  115 (11/19/2009)   Sodium:  141 (11/19/2009)   Potassium:  4.7 (11/19/2009)   Chloride:  102 (11/19/2009)   CO2:  31 (11/19/2009)   BUN:  11 (11/19/2009)   Creatinine:  0.6 (11/19/2009)   Albumin:  4.0 (11/19/2009)   Total Protein:  7.2 (11/19/2009)   Calcium:  9.3 (11/19/2009)   Total Bili:  0.6 (11/19/2009)   Alk Phos:  65 (11/19/2009)   SGPT (ALT):  17 (11/19/2009)   SGOT (AST):  19 (11/19/2009)   Current Medications (verified): 1)  Claritin 10 Mg  Tabs (Loratadine) .... As Needed 2)  Proventil Hfa 108 (90 Base) Mcg/act Aers (Albuterol Sulfate) .... 2 Puff Q4-6 Hours As Needed 3)  Mucinex Dm 30-600 Mg  Tb12 (Dextromethorphan-Guaifenesin) .Marland Kitchen.. 1-2 Tabs Every 12 Hours As Needed 4)  Aspirin 81 Mg Tabs (Aspirin) .... 2 By Mouth Once  Daily 5)  Fish Oil 1200 Mg Caps (Omega-3 Fatty Acids) .Marland Kitchen.. 1 By Mouth Two Times A Day  Allergies (verified): 1)  ! Penicillin V Potassium (Penicillin V Potassium) 2)  ! Sulfazine (Sulfasalazine) 3)  ! 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         c/o sore and swollen tounge x 3 days, no pain swallowing; ++stress (marital) - see above  Physical Exam  General:  Well-developed,well-nourished,in no acute distress; alert,appropriate and cooperative throughout examination Ears:  normal pinnae  bilaterally, without erythema, swelling, or tenderness to palpation. TMs clear, without effusion, or cerumen impaction. Hearing mildly diminished bilaterally  Mouth:  teeth and gums in good repair; mucous membranes moist, without lesions or ulcers. mod glossal swelling, smoothing - scant candidiasis -no other angioedema; OP clear without exudate, no erythema.  Lungs:  normal respiratory effort, no intercostal retractions or use of accessory muscles; normal breath sounds bilaterally - no crackles and no wheezes.    Heart:  normal rate, regular rhythm, no murmur, and no rub. BLE without edema. Psych:  Oriented X3, memory intact for recent and remote, normally interactive, good eye contact, not anxious appearing, not depressed appearing, and not agitated.   very pleasant   Impression & Recommendations:  Problem # 1:  DYSLIPIDEMIA (ICD-272.4)  Orders: TLB-Lipid Panel (80061-LIPID)  labs and CHD risk reviewed - very high chol and LDL but high HDL and low TGs - calc to be less than avg risk for CHD depsite the high LDL/total - now on fish oil but would be willing to take statin if not improving - check FLP now and initiate tx with statin if total/LDL not improved also to continue to focus on diet  exercise in meanwhile -  Labs Reviewed: SGOT: 19 (11/19/2009)   SGPT: 17 (11/19/2009)   HDL:98.70 (11/19/2009)  Chol:314 (11/19/2009)  Trig:65.0 (11/19/2009)  Her updated medication list for this problem includes:    Pravastatin Sodium 20 Mg Tabs (Pravastatin sodium) .Marland Kitchen... 1 by mouth at bedtime  Problem # 2:  GLOSSITIS (ICD-529.0)  treat for poss yeast with nystatin swish & spit  Orders: Prescription Created Electronically [phone])  Problem # 3:  HYPERGLYCEMIA (ICD-790.29) mild elev last fasting - ?dm -  reck now and add on a1c if still high Orders: TLB-Glucose, QUANT (82947-GLU)  Labs Reviewed: Creat: 0.6 (11/19/2009)     Complete Medication List: 1)  Claritin 10 Mg Tabs (Loratadine)  .... As needed 2)  Proventil Hfa 108 (90 Base) Mcg/act Aers (Albuterol sulfate) .... 2 puff q4-6 hours as needed 3)  Mucinex Dm 30-600 Mg Tb12 (Dextromethorphan-guaifenesin) .Marland Kitchen.. 1-2 tabs every 12 hours as needed 4)  Aspirin 81 Mg Tabs (Aspirin) .... 2 by mouth once daily 5)  Fish Oil 1200 Mg Caps (Omega-3 fatty acids) .Marland Kitchen.. 1 by mouth two times a day 6)  Nystatin 100000 Unit/ml Susp (Nystatin) .... 5-10cc by mouth swish & spit four times a day as needed 7)  Pravastatin Sodium 20 Mg Tabs (Pravastatin sodium) .Marland Kitchen.. 1 by mouth at bedtime  Patient Instructions: 1)  it was good to see you today. 2)  hang in there! 3)  test(s) ordered today - your results will be called to you  in 48-72 hours from the time of test completion; if any changes need to be made or there are abnormal results, you will be notified at that time 4)  try nystatin swish and spit as discussed for tongue swelling and pain - let us know if this is unimproved in next 72h 5)  let us also know if supply of xanax is needed at this time - 6)  Please schedule a follow-up appointment in 3 months to monitor cholesterol, sooner if problems.  Prescriptions: NYSTATIN 100000 UNIT/ML SUSP (NYSTATIN) 5-10cc by mouth swish & spit four times a day as needed  #200cc x 1   Entered and Authorized by:   Newt Lukes MD   Signed by:   Newt Lukes MD on 03/02/2010   Method used:   Electronically to        [facility]  [phone]* (retail)       [address] [place], [place]  [address]       Ph: [phone] or [phone]       Fax: [phone]   RxID:   [id number]

## 2010-08-17 NOTE — Progress Notes (Signed)
Summary: referral  Phone Note Call from Patient Call back at Home Phone [phone]   Caller: Patient Summary of Call: pt called stating that she would like to be referred to Dr. Jamey Ripa for surgery. Initial call taken by: Margaret Pyle, CMA,  Dec 08, 2009 2:55 PM  Follow-up for Phone Call        I confirmed this with pt as Jamey Ripa is in [place], not [facility] as prev requested - she confirms this local preference- i then called and spoke directly with him (dr. Jamey Ripa) re: this situation and prior referral to their office - He agrees to see her and eval further - their office will call her to arrange for Fri 5/27 or next week - order placed on my behalf for same - please call and let pt know this information - thanks Follow-up by: Newt Lukes MD,  Dec 09, 2009 3:48 PM  Additional Follow-up for Phone Call Additional follow up Details #1::        per pt she has been contacted by Dr Jamey Ripa office and scheduled for 12/18/2009. Additional Follow-up by: Margaret Pyle, CMA,  Dec 09, 2009 3:58 PM

## 2010-08-17 NOTE — Assessment & Plan Note (Signed)
Summary: NEW MEDICARE PT--PKG--STC   Vital Signs:  Patient profile:   70 year old female Height:      67 inches (170.18 cm) Weight:      158.12 pounds (71.87 kg) O2 Sat:      97 % on Room air Temp:     97.6 degrees F (36.44 degrees C) oral Pulse rate:   76 / minute BP sitting:   122 / 82  (left arm) Cuff size:   regular  Vitals Entered By: Orlan Leavens (August 20, 2009 9:39 AM)  O2 Flow:  Room air CC: New patient Is Patient Diabetic? No Pain Assessment Patient in pain? no        Primary Care Provider:  Newt Lukes MD  CC:  New patient.  History of Present Illness: new pt to me and our division - here today to establish care  1) left knee pain and swelling - onset initially 3 months ago problem precipitated by "bad positioning" in chair overnight while spouse in hosp swelling and pain last <48h, then resolved spont - pain controlled with ASA as needed - then recurrent swelling and pain same knee 2 weeks ago after still position in car trip no calf swelling - no trauma - +hx arthritis in knees 71yr ago but no f/u with ortho since that time  2) concerned about hearing loss more notable in past 6 mos - symptoms worst when in crowded room - no pulsing or buzzing - no pain no vision changes  3) 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 in many mos - not needing rescue inhaler - follows with pulm- [facility] for same  Preventive Screening-Counseling & Management  Alcohol-Tobacco     Alcohol drinks/day: 1     Alcohol type: wine     Alcohol Counseling: not indicated; use of alcohol is not excessive or problematic     Smoking Status: never     Tobacco Counseling: not indicated; no tobacco use  Caffeine-Diet-Exercise     Diet Counseling: not indicated; diet is assessed to be healthy     Does Patient Exercise: yes     Exercise Counseling: not indicated; exercise is adequate     Depression Counseling: not indicated; screening negative for  depression  Safety-Violence-Falls     Violence in the Home: no risk noted     Fall Risk Counseling: not indicated; no significant falls noted  Clinical Review Panels:  Prevention   [facility]  [facility]  Results: Hemorrhoids.     Results: Diverticulosis.        (04/15/2004)  Immunizations   Last Tetanus Booster:  Historical (07/18/2004)   Last Flu Vaccine:  Historical (04/17/2009)   Last Pneumovax:  Pneumovax (Medicare) (08/20/2009)   Current Medications (verified): 1)  Claritin 10 Mg  Tabs (Loratadine) .... As Needed 2)  Proventil Hfa 108 (90 Base) Mcg/act Aers (Albuterol Sulfate) .... 2 Puff Q4-6 Hours As Needed 3)  Mucinex Dm 30-600 Mg  Tb12 (Dextromethorphan-Guaifenesin) .Marland Kitchen.. 1-2 Tabs Every 12 Hours As Needed  Allergies (verified): 1)  ! Penicillin V Potassium (Penicillin V Potassium) 2)  ! Sulfazine (Sulfasalazine)  Past History:  Past Medical History: HERPES ZOSTER hx BRONCHIECTASIS  HERNIORRHAPHY, HX OF  OSTEOPENIA  ALLERGIC RHINITIS Osteoarthritis, knees L>R  MD rooster: pulm - [facility]    Past Surgical History: bilaterally mastectomy, 20 years ago silicone breast explant - 1993  Family History: Reviewed history from 09/07/2007 and no changes required. pancreatic Ca father died young  Social History: Never Smoked occassional white wine married, lives with spouse 22yo adopted son nearby retired from public health -nutritionistDoes Patient Exercise:  yes  Review of Systems       The patient complains of decreased hearing.  The patient denies anorexia, fever, weight loss, weight gain, vision loss, chest pain, syncope, dyspnea on exertion, peripheral edema, headaches, abdominal pain, muscle weakness, and depression.         see HPI above. I have reviewed all other systems and they were negative.   Physical Exam  General:  alert, well-developed, well-nourished, and cooperative to examination.    Eyes:  vision grossly  intact; pupils equal, round and reactive to light.  conjunctiva and lids normal.   wears glasses Ears:  normal pinnae bilaterally, without erythema, swelling, or tenderness to palpation. TMs clear, without effusion, or cerumen impaction. Hearing mildly diminished bilaterally  Mouth:  teeth and gums in good repair; mucous membranes moist, without lesions or ulcers. oropharynx clear without exudate, no erythema.  Neck:  No deformities, masses, or tenderness noted. Lungs:  normal respiratory effort, no intercostal retractions or use of accessory muscles; normal breath sounds bilaterally - no crackles and no wheezes.    Heart:  normal rate, regular rhythm, no murmur, and no rub. BLE without edema. Abdomen:  soft, non-tender, normal bowel sounds, no distention; no masses and no appreciable hepatomegaly or splenomegaly.   Msk:  ?mild scoli thoracic spine- prominant right scapular region as compared to left - no pain Neurologic:  alert & oriented X3 and cranial nerves II-XII symetrically intact.  strength normal in all extremities, sensation intact to light touch, and gait normal. speech fluent without dysarthria or aphasia; follows commands with good comprehension.  Skin:  no rashes, vesicles, ulcers, or erythema. No nodules or irregularity to palpation.  Psych:  Oriented X3, memory intact for recent and remote, normally interactive, good eye contact, not anxious appearing, not depressed appearing, and not agitated.   very pleasant   Impression & Recommendations:  Problem # 1:  DEGENERATIVE JOINT DISEASE, KNEES, BILATERAL (ICD-715.96)  L>R with recent episodes x 2 of transient swelling in last few months - check Xray to eval for DDD now -  no acute flare or need for injection but consider as needed including possible ortho eval Tylenol arthritis rec with as needed ASA and neoprene sleve for support when flares  Discussed strengthening exercises, use of ice or heat, and medications.    Orders: T-Knee Left 2 view (73560TC) T-Knee Right 2 view (73560TC)  Her updated medication list for this problem includes:    Aspirin 81 Mg Tabs (Aspirin) .Marland Kitchen... 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  follows with pulm annually as needed   Problem # 4:  OSTEOPENIA (ICD-733.90)  Orders: T-Bone Densitometry (16109) T-Lumbar Vertebral Assessment (60454)  Discussed medication use, applications of heat or ice, and exercises.   Complete Medication List: 1)  Claritin 10 Mg Tabs (Loratadine) .... As needed 2)  Proventil Hfa 108 (90 Base) Mcg/act Aers (Albuterol sulfate) .... 2 puff q4-6 hours as needed 3)  Mucinex Dm 30-600 Mg Tb12 (Dextromethorphan-guaifenesin) .Marland Kitchen.. 1-2 tabs every 12 hours as needed 4)  Aspirin 81 Mg Tabs (Aspirin) .... 2 by mouth once daily  Other Orders: Pneumococcal Vaccine (09811) Admin 1st Vaccine (91478)  Patient Instructions: 1)  it  was good to see you today.  2)  xray of knees ordered today - your results will be called to you after review, up to 48-72 hours from the time of test completion. 3)  May use Tylenol Arthritis or Aspirin 325 daily as needed for knee pain flares - let us know if these episodes become more frequent 4)  i also recommend using neoprene sleeve for knee support when swelling occurs - can wear day and night as needed  5)  pneumovax done today - consider the zostavax (shingles) after discussion with your insurance re: coverage 6)  we'll make referral to get bone density scheduled and to audiologist for hearing evaluation. Our office will contact you regarding this appointment once made.  7)  Please schedule a follow-up appointment in 3-4 months, sooner if problems. Please come fasting so we can check cholesterol   Colonoscopy  Procedure date:   04/15/2004  Findings:      Location:  [facility].  Results: Hemorrhoids.     Results: Diverticulosis.         Colonoscopy  Procedure date:  07/18/1998  Findings:      Results: Polyp, removed     Immunization History:  Tetanus/Td Immunization History:    Tetanus/Td:  historical (07/18/2004)  Influenza Immunization History:    Influenza:  historical (04/17/2009)  Immunizations Administered:  Pneumonia Vaccine:    Vaccine Type: Pneumovax (Medicare)    Site: left deltoid    Mfr: Merck    Dose: 0.5 ml    Route: IM    Given by: Orlan Leavens    Exp. Date: 08/18/2010    Lot #: [id number]    VIS given: 08/20/09    Orders Added: 1)  T-Knee Left 2 view [73560TC] 2)  T-Knee Right 2 view [73560TC] 3)  T-Bone Densitometry [77080] 4)  T-Lumbar Vertebral Assessment [77082] 5)  Audiology [Audio] 6)  New Patient Level IV [99204] 7)  Pneumococcal Vaccine [90732] 8)  Admin 1st Vaccine [79892]

## 2010-08-31 ENCOUNTER — Other Ambulatory Visit: Payer: Medicare Other

## 2010-08-31 ENCOUNTER — Other Ambulatory Visit: Payer: Self-pay | Admitting: Internal Medicine

## 2010-08-31 ENCOUNTER — Ambulatory Visit (INDEPENDENT_AMBULATORY_CARE_PROVIDER_SITE_OTHER): Payer: Medicare Other | Admitting: Internal Medicine

## 2010-08-31 ENCOUNTER — Encounter: Payer: Self-pay | Admitting: Internal Medicine

## 2010-08-31 DIAGNOSIS — R413 Other amnesia: Secondary | ICD-10-CM

## 2010-08-31 DIAGNOSIS — F411 Generalized anxiety disorder: Secondary | ICD-10-CM | POA: Insufficient documentation

## 2010-08-31 DIAGNOSIS — E538 Deficiency of other specified B group vitamins: Secondary | ICD-10-CM | POA: Insufficient documentation

## 2010-08-31 DIAGNOSIS — F0390 Unspecified dementia without behavioral disturbance: Secondary | ICD-10-CM | POA: Insufficient documentation

## 2010-08-31 HISTORY — DX: Unspecified dementia, unspecified severity, without behavioral disturbance, psychotic disturbance, mood disturbance, and anxiety: F03.90

## 2010-08-31 LAB — CBC WITH DIFFERENTIAL/PLATELET
Basophils Relative: 0.8 % (ref 0.0–3.0)
Eosinophils Relative: 2.5 % (ref 0.0–5.0)
HCT: 39 % (ref 36.0–46.0)
Hemoglobin: 13.5 g/dL (ref 12.0–15.0)
Lymphs Abs: 1.8 10*3/uL (ref 0.7–4.0)
Monocytes Relative: 7.5 % (ref 3.0–12.0)
Neutro Abs: 4.1 10*3/uL (ref 1.4–7.7)
WBC: 6.6 10*3/uL (ref 4.5–10.5)

## 2010-08-31 LAB — URINALYSIS, ROUTINE W REFLEX MICROSCOPIC
Bilirubin Urine: NEGATIVE
Ketones, ur: NEGATIVE
Leukocytes, UA: NEGATIVE
Urobilinogen, UA: 0.2 (ref 0.0–1.0)
pH: 6 (ref 5.0–8.0)

## 2010-08-31 LAB — HEPATIC FUNCTION PANEL
ALT: 18 U/L (ref 0–35)
AST: 22 U/L (ref 0–37)
Albumin: 3.9 g/dL (ref 3.5–5.2)
Total Protein: 6.9 g/dL (ref 6.0–8.3)

## 2010-08-31 LAB — BASIC METABOLIC PANEL
Chloride: 101 mEq/L (ref 96–112)
GFR: 101.77 mL/min (ref >60.00)
Glucose, Bld: 103 mg/dL — ABNORMAL HIGH (ref 70–99)
Potassium: 4.5 mEq/L (ref 3.5–5.1)
Sodium: 137 mEq/L (ref 135–145)

## 2010-08-31 LAB — TSH: TSH: 1.62 u[IU]/mL (ref 0.35–5.50)

## 2010-08-31 LAB — B12 AND FOLATE PANEL: Vitamin B-12: 220 pg/mL (ref 211–911)

## 2010-09-02 ENCOUNTER — Ambulatory Visit (INDEPENDENT_AMBULATORY_CARE_PROVIDER_SITE_OTHER): Payer: Medicare Other

## 2010-09-02 ENCOUNTER — Encounter: Payer: Self-pay | Admitting: Internal Medicine

## 2010-09-02 DIAGNOSIS — E538 Deficiency of other specified B group vitamins: Secondary | ICD-10-CM

## 2010-09-08 NOTE — Assessment & Plan Note (Signed)
Summary: [name] [name] Fox  Nurse Visit   Allergies: 1)  ! Penicillin V Potassium (Penicillin V Potassium) 2)  ! Sulfazine (Sulfasalazine) 3)  ! * Pneumonia Vaccine  Medication Administration  Injection # 1:    Medication: Vit B12 1000 mcg    Diagnosis: VITAMIN B12 DEFICIENCY (ICD-266.2)    Route: IM    Site: L deltoid    Exp Date: 05/18/2012    Lot #: [id number]    Mfr: [employer]    Patient tolerated injection without complications    Given by: Margaret Pyle, CMA (September 02, 2010 2:20 PM)  Orders Added: 1)  Admin of Therapeutic Inj  intramuscular or subcutaneous [96372] 2)  Vit B12 1000 mcg [J3420]

## 2010-09-08 NOTE — Assessment & Plan Note (Signed)
Summary: issues to evaluate   Vital Signs:  Patient profile:   70 year old female Height:      67 inches Weight:      149 pounds BMI:     23.42 Temp:     97.9 degrees F oral Pulse rate:   76 / minute Pulse rhythm:   regular Resp:     16 per minute BP sitting:   130 / 80  (left arm) Cuff size:   regular  Vitals Entered By: Lanier Prude, Beverly Gust) (August 31, 2010 2:38 PM) CC: confusion and memory loss Is Patient Diabetic? No   Primary Care Provider:  Newt Lukes MD  CC:  confusion and memory loss.  History of Present Illness: c/o confusion and "fuzzy head" symptoms  onset 4-6 weeks ago assoc with ringing in ears, LLE burning pain and emotional labile feels "nervous" and "anxious" all the time - noted by spouse and son no $ errors or driving mistakes denies hx same - no med changes, head trauma or new stressors no FH memory loss or dementia  also notes continued stress with spouse - s/p marital counseling - still feels "micromanaged" reports she is handling stress well "on the outside" and prev declined need for meds has support of friends and church-  also reviewed prior chronic med issues dyslipidemia - has never been on med tx for same - follows low fat diet and exercises regularly - also uses fish oil - has carefully considered options and agrees to try med rx if still high flp  arthritis -knee L>R still with swelling and pain L>R knee, esp while sitting prolnged car trip pain controlled with ASA as needed - improved when wears neoprene sleeve  hx keloid formation and tenderness over left breast scar silicone breast implants removed almost 41yr ago redness and pain over scar around 11/2009 saw surg 12/18/09 with bx: keloid, no cancer  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 in many mos - not needing rescue inhaler - follows with pulm- [facility] for same -annually   Clinical Review Panels:  CBC   WBC:  4.6 (11/19/2009)   RBC:  4.47 (11/19/2009)  Hgb:  13.7 (11/19/2009)   Hct:  39.0 (11/19/2009)   Platelets:  378.0 (11/19/2009)   MCV  87.4 (11/19/2009)   MCHC  35.0 (11/19/2009)   RDW  14.4 (11/19/2009)   PMN:  56.7 (11/19/2009)   Lymphs:  30.3 (11/19/2009)   Monos:  7.1 (11/19/2009)   Eosinophils:  4.2 (11/19/2009)   Basophil:  1.7 (11/19/2009)  Complete Metabolic Panel   Glucose:  96 (03/02/2010)   Sodium:  141 (11/19/2009)   Potassium:  4.7 (11/19/2009)   Chloride:  102 (11/19/2009)   CO2:  31 (11/19/2009)   BUN:  11 (11/19/2009)   Creatinine:  0.6 (11/19/2009)   Albumin:  4.0 (11/19/2009)   Total Protein:  7.2 (11/19/2009)   Calcium:  9.3 (11/19/2009)   Total Bili:  0.6 (11/19/2009)   Alk Phos:  65 (11/19/2009)   SGPT (ALT):  17 (11/19/2009)   SGOT (AST):  19 (11/19/2009)   Current Medications (verified): 1)  Claritin 10 Mg  Tabs (Loratadine) .... As Needed 2)  Proventil Hfa 108 (90 Base) Mcg/act Aers (Albuterol Sulfate) .... 2 Puff Q4-6 Hours As Needed 3)  Mucinex Dm 30-600 Mg  Tb12 (Dextromethorphan-Guaifenesin) .Marland Kitchen.. 1-2 Tabs Every 12 Hours As Needed 4)  Aspirin 81 Mg Tabs (Aspirin) .... 2 By Mouth Once Daily 5)  Fish Oil 1200 Mg Caps (Omega-3 Fatty  Acids) .Marland Kitchen.. 1 By Mouth Two Times A Day 6)  Nystatin 100000 Unit/ml Susp (Nystatin) .... 5-10cc By Mouth Swish & Spit Four Times A Day As Needed 7)  Pravastatin Sodium 20 Mg Tabs (Pravastatin Sodium) .Marland Kitchen.. 1 By Mouth At Bedtime  Allergies (verified): 1)  ! Penicillin V Potassium (Penicillin V Potassium) 2)  ! Sulfazine (Sulfasalazine) 3)  ! 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    Physical Exam  General:  Well-developed,well-nourished,in no acute distress;  alert,appropriate and cooperative throughout examination Lungs:  normal respiratory effort, no intercostal retractions or use of accessory muscles; normal breath sounds bilaterally - no crackles and no wheezes.    Heart:  normal rate, regular rhythm, no murmur, and no rub. BLE without edema. Neurologic:  alert & oriented X3 and cranial nerves II-XII symetrically intact.  strength normal in all extremities, sensation intact to light touch, and gait normal. speech fluent without dysarthria or aphasia; follows commands with good comprehension.  Psych:  Oriented X3, memory intact for recent and remote, normally interactive, good eye contact, min anxious appearing, mildly depressed appearing - occ tearful, but not agitated.  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 anxiety and depression - pt agrees with same consider need for MRI brain but will 1st address any lab abn and trial of SSRI if labs normal - pt understands same  also reviewed palsn for eval and tx with spouse after OV at pt request - also understands and agrees f/u 6-8 weeks to review same -  Orders: TLB-B12 + Folate Pnl (82746_82607-B12/FOL) TLB-BMP (Basic Metabolic Panel-BMET) (80048-METABOL) TLB-CBC Platelet - w/Differential (85025-CBCD) TLB-Hepatic/Liver Function Pnl (80076-HEPATIC) TLB-TSH (Thyroid Stimulating Hormone) (84443-TSH) TLB-Udip w/ Micro (81001-URINE)  Problem # 2:  ANXIETY (ICD-300.00)  see above  Her updated medication list for this problem includes:    Sertraline Hcl 25 Mg Tabs (Sertraline hcl) .Marland Kitchen... 1 by mouth once daily  Complete Medication List: 1)  Claritin 10 Mg Tabs (Loratadine) .... As needed 2)  Proventil Hfa 108 (90 Base) Mcg/act Aers (Albuterol sulfate) .... 2 puff q4-6 hours as needed 3)   Mucinex Dm 30-600 Mg Tb12 (Dextromethorphan-guaifenesin) .Marland Kitchen.. 1-2 tabs every 12 hours as needed 4)  Aspirin 81 Mg Tabs (Aspirin) .... 2 by mouth once daily 5)  Fish Oil 1200 Mg Caps (Omega-3 fatty acids) .Marland Kitchen.. 1 by mouth two times a day 6)  Nystatin 100000 Unit/ml Susp (Nystatin) .... 5-10cc by mouth swish & spit four times a day as needed 7)  Pravastatin Sodium 20 Mg Tabs (Pravastatin sodium) .Marland Kitchen.. 1 by mouth at bedtime 8)  Cyanocobalamin 1000 Mcg/ml Soln (Cyanocobalamin) .Marland Kitchen.. im monthly 9)  Sertraline Hcl 25 Mg Tabs (Sertraline hcl) .Marland Kitchen.. 1 by mouth once daily  Patient Instructions: 1)  it was good to see you today. 2)  test(s) ordered today - your results will be called to you after review in 48-72 hours from the time of test completion; if any changes need to be made or there are abnormal results, you will  be notified at this time - possible MRI as we discussed and/or treatment for anxiety/depression if normal labs and imaging 3)  Please schedule a follow-up appointment in 6-8 weeks to review symptoms and medications, call sooner if problems.    Orders Added: 1)  TLB-B12 + Folate Pnl [82746_82607-B12/FOL] 2)  TLB-BMP (Basic Metabolic Panel-BMET) [80048-METABOL] 3)  TLB-CBC Platelet - w/Differential [85025-CBCD] 4)  TLB-Hepatic/Liver Function Pnl [80076-HEPATIC] 5)  TLB-TSH (Thyroid Stimulating Hormone) [84443-TSH] 6)  TLB-Udip w/ Micro [81001-URINE] 7)  Est. Patient Level IV [16109]    Prevention & Chronic Care Immunizations   Influenza vaccine: Historical  (04/17/2009)    Tetanus booster: 07/18/2004: Historical    Pneumococcal vaccine: Pneumovax (Medicare)  (08/20/2009)    H. zoster vaccine: Not documented  Colorectal Screening   Hemoccult: Not documented    Colonoscopy: Location:  [facility].  Results: Hemorrhoids.     Results: Diverticulosis.         (04/15/2004)  Other Screening   Pap smear: Not documented    Mammogram: Not documented    DXA  bone density scan: Not documented   Smoking status: never  (11/19/2009)  Lipids   Total Cholesterol: 290  (03/02/2010)   LDL: Not documented   LDL Direct: 212.7  (03/02/2010)   HDL: 69.30  (03/02/2010)   Triglycerides: 67.0  (03/02/2010)    SGOT (AST): 19  (11/19/2009)   SGPT (ALT): 17  (11/19/2009)   Alkaline phosphatase: 65  (11/19/2009)   Total bilirubin: 0.6  (11/19/2009)  Self-Management Support :    Lipid self-management support: Not documented

## 2010-10-06 ENCOUNTER — Encounter: Payer: Self-pay | Admitting: *Deleted

## 2010-10-12 ENCOUNTER — Ambulatory Visit: Payer: Medicare Other | Admitting: Internal Medicine

## 2010-10-12 ENCOUNTER — Encounter: Payer: Self-pay | Admitting: Internal Medicine

## 2010-10-14 ENCOUNTER — Encounter: Payer: Self-pay | Admitting: Internal Medicine

## 2010-10-14 ENCOUNTER — Ambulatory Visit (INDEPENDENT_AMBULATORY_CARE_PROVIDER_SITE_OTHER): Payer: Medicare Other | Admitting: Internal Medicine

## 2010-10-14 DIAGNOSIS — E538 Deficiency of other specified B group vitamins: Secondary | ICD-10-CM

## 2010-10-14 DIAGNOSIS — J069 Acute upper respiratory infection, unspecified: Secondary | ICD-10-CM

## 2010-10-14 DIAGNOSIS — R413 Other amnesia: Secondary | ICD-10-CM

## 2010-10-14 MED ORDER — CYANOCOBALAMIN 1000 MCG/ML IJ SOLN
1000.0000 ug | Freq: Once | INTRAMUSCULAR | Status: AC
  Administered 2010-10-14: 1000 ug via INTRAMUSCULAR

## 2010-10-14 NOTE — Patient Instructions (Signed)
It was good to see you today. Test(s) ordered today. Your results will be called to you after review (48-72hours after test completion). If any changes need to be made, you will be notified at that time. Medications reviewed, no changes at this time. If you develop worsening symptoms or fever, call and we can reconsider antibiotics, but it does not appear necessary to use antibiotics at this time. Please schedule followup in 3-4 months to review memory symptoms and other problems, call sooner if problems.

## 2010-10-14 NOTE — Assessment & Plan Note (Signed)
Labs 08/2010 reviewed - cont B12 replacement, no other abnormalities Check MRI brain now and continue SSRI for "pseudodementia symptoms"

## 2010-10-14 NOTE — Assessment & Plan Note (Signed)
The current medical regimen is effective;  continue present plan and medications. it does not appear necessary to use antibiotics at this time.

## 2010-10-14 NOTE — Assessment & Plan Note (Signed)
Lab Results  Component Value Date   VITAMINB12 220 08/31/2010   Continue monthly IM replacement - ?contribution to "pseduodementia" symptoms

## 2010-10-14 NOTE — Progress Notes (Signed)
  Subjective:    Patient ID: [name] Fox, female    DOB: May 06, 1941, 70 y.o.   MRN: [id number]  HPI F/u "psuedodementia" - started on low dose SSRI for same 08/31/10 after screening labs unremarkable; improving "fuzzy head" symptoms but still mild foregetfulness.  Feels improved "nervous" and "anxious" - noted by spouse and son no $ errors or driving mistakes; notes continued but improving stress with spouse - s/p marital counseling - still feels "micromanaged" but "livable" has support of friends and church-  Borderline B12 defic - dx2/2012 - on IM replacement for same  dyslipidemia - has never been on med tx for same - follows low fat diet and exercises regularly - also uses fish oil - started statin - the patient reports compliance with medication(s) as prescribed. Denies adverse side effects.  arthritis -knee L>R occ swelling and pain L>R knee, esp while sitting prolnged car trip pain controlled with ASA as needed - improved when wears neoprene sleeve  hx keloid formation and tenderness over left breast scar silicone breast implants removed > 22yr ago redness and pain over scar around 11/2009; saw surg 12/18/09 with bx: keloid, no cancer  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 in many mos - not needing rescue inhaler - follows with pulm- [facility] for same annually  Past Medical History  Diagnosis Date  . HERPES ZOSTER 09/10/2007  . DYSLIPIDEMIA 12/08/2009  . HEARING LOSS, BILATERAL 08/20/2009  . ALLERGIC RHINITIS 04/06/2007  . 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 OSTEOPENIA 04/06/2007  . VITAMIN B12 DEFICIENCY 08/31/2010  . ANXIETY 08/31/2010  . Memory loss 08/31/2010   Review of Systems  Constitutional: Negative for fever.  Respiratory: Positive for cough. Negative for shortness of breath and wheezing.   Cardiovascular: Negative for chest pain.  Neurological: Negative for dizziness, syncope, numbness and headaches.      Objective:   Physical Exam BP 134/82   Pulse 63  Temp(Src) 98.4 F (36.9 C) (Oral)  Ht 5\' 7"  (1.702 m)  Wt 153 lb 12.8 oz (69.763 kg)  BMI 24.09 kg/m2 Physical Exam  Constitutional: She is oriented to person, place, and time. She appears well-developed and well-nourished. No distress.  Eyes: Conjunctivae and EOM are normal. Pupils are equal, round, and reactive to light. No scleral icterus.  Cardiovascular: Normal rate, regular rhythm and normal heart sounds.  No murmur heard. Pulmonary/Chest: Effort normal and breath sounds normal. No respiratory distress. She has no wheezes.  Psychiatric: She has a normal mood and affect. Brighter. Her behavior is normal. Judgment and thought content normal.   Lab Results  Component Value Date   VITAMINB12 220 08/31/2010   Lab Results  Component Value Date   WBC 6.6 08/31/2010   HGB 13.5 08/31/2010   HCT 39.0 08/31/2010   PLT 391.0 08/31/2010   CHOL 290* 03/02/2010   TRIG 67.0 03/02/2010   HDL 69.30 03/02/2010   LDLDIRECT 212.7 03/02/2010   ALT 18 08/31/2010   AST 22 08/31/2010   NA 137 08/31/2010   K 4.5 08/31/2010   CL 101 08/31/2010   CREATININE 0.6 08/31/2010   BUN 11 08/31/2010   CO2 30 08/31/2010   TSH 1.62 08/31/2010       Assessment & Plan:  See problem list. Medications and labs reviewed today. Mild URI symptoms - reassurance provided - to call if worse or unimproved; no antibiotics at this time

## 2010-10-18 ENCOUNTER — Encounter: Payer: Self-pay | Admitting: Internal Medicine

## 2010-10-18 MED ORDER — CYANOCOBALAMIN 1000 MCG/ML IJ SOLN: 1000.0000 ug | Freq: Once | INTRAMUSCULAR | Status: DC

## 2010-10-26 ENCOUNTER — Telehealth: Payer: Self-pay | Admitting: Internal Medicine

## 2010-10-26 ENCOUNTER — Ambulatory Visit (HOSPITAL_COMMUNITY)
Admission: RE | Admit: 2010-10-26 | Discharge: 2010-10-26 | Disposition: A | Discharge status: Home / Self Care | Payer: Medicare Other | Source: Ambulatory Visit | Attending: Internal Medicine | Admitting: Internal Medicine

## 2010-10-26 ENCOUNTER — Other Ambulatory Visit (HOSPITAL_COMMUNITY): Payer: Self-pay | Admitting: Pediatrics

## 2010-10-26 DIAGNOSIS — R413 Other amnesia: Secondary | ICD-10-CM

## 2010-10-26 DIAGNOSIS — G319 Degenerative disease of nervous system, unspecified: Secondary | ICD-10-CM | POA: Insufficient documentation

## 2010-10-26 DIAGNOSIS — I6789 Other cerebrovascular disease: Secondary | ICD-10-CM | POA: Insufficient documentation

## 2010-10-26 NOTE — Telephone Encounter (Signed)
Please call patient - normal MRI brain results. No medication changes recommended.  IMPRESSION: Age appropriate atrophy and minimal chronic microvascular ischemia. Negative for acute infarct or mass.   Continue treatment as ongoing at this time and will reconsider if aricpet needed at next OV, call sooner if problems - Thanks

## 2010-10-26 NOTE — Telephone Encounter (Signed)
Pt Notified with MRI results.Marland KitchenMarland Kitchen4/04/1220:33pm/LMB

## 2010-11-15 ENCOUNTER — Ambulatory Visit (INDEPENDENT_AMBULATORY_CARE_PROVIDER_SITE_OTHER): Payer: Medicare Other | Admitting: Internal Medicine

## 2010-11-15 DIAGNOSIS — E538 Deficiency of other specified B group vitamins: Secondary | ICD-10-CM

## 2010-11-15 MED ORDER — CYANOCOBALAMIN 1000 MCG/ML IJ SOLN
1000.0000 ug | Freq: Once | INTRAMUSCULAR | Status: DC
  Administered 2010-11-15: 1000 ug via INTRAMUSCULAR

## 2010-11-15 NOTE — Progress Notes (Signed)
Pt tolerated injection well

## 2010-11-15 NOTE — Progress Notes (Signed)
  Subjective:    Patient ID: [name] Fox, female    DOB: 03/08/1941, 70 y.o.   MRN: [id number]  HPI    Review of Systems     Objective:   Physical Exam        Assessment & Plan:   

## 2010-11-15 NOTE — Progress Notes (Deleted)
  Subjective:    Patient ID: [name] Fox, female    DOB: April 22, 1941, 70 y.o.   MRN: [id number]  HPI    Review of Systems     Objective:   Physical Exam        Assessment & Plan:

## 2010-11-30 NOTE — Assessment & Plan Note (Signed)
[facility]                             PULMONARY OFFICE NOTE   NAME:[name] Fox, [name] Fox                         MRN:          [id number]  DATE:02/14/2007                            DOB:          28-Aug-1940    SUBJECTIVE:  Ms. Recktenwald is a 70 year old woman who follows up today for  mild lower lobe bronchiectasis and documented air flow limitation on  pulmonary function testing. She tells me today that she is breathing  very well. She is not experiencing any pulmonary limitations and is able  to do her usual daily activities. She has not required any albuterol.  She does occasionally feel more short of breath when the air quality is  poor or when the humidity is high. She is being maintained on Qvar 80,  one inhalation b.i.d. Her last CT scan of the chest was in January 2007.   MEDICATIONS:  1. Qvar 80, one inhalation b.i.d.  2. Claritin p.r.n.  3. Albuterol 2 puffs q.4 h p.r.n.   PHYSICAL EXAMINATION:  GENERAL:  This is a pleasant woman who is in no  distress on room air.  VITAL SIGNS:  Her weight is 162 pounds, temperature is 98.3, blood  pressure 112/68, heart rate 60, SPO2 97% on room air.  HEENT:  Benign with a moist oropharynx and no evidence of post nasal  drip.  NECK:  Supple without lymphadenopathy or stridor.  LUNGS:  Clear to auscultation bilaterally. There is no wheezing on a  forced expiration.  HEART:  Regular without murmur.  ABDOMEN:  Benign.  EXTREMITIES:  No cyanosis, clubbing or edema.   IMPRESSION:  1. Bronchiectasis without an acute exacerbation. We discussed      decreasing her Qvar and hopefully weaning her to off. To simplify      her medical regimen, we will change her to Qvar 40 one inhalation      b.i.d. for 1 month and then she will try to discontinue the      medication altogether. If she is able to tolerate this, then she      will stay off maintenance therapy. If she experiences symptoms or      begins to have more  frequent symptoms or begins to have more      frequent flares of her bronchiectasis then I believe she will need      maintenance therapy indefinitely.  2. We will repeat her CT scan of the chest in January 2009 to look for      interval change in her      bronchiectasis.  3. She will followup with me in 2 months to assess her progress off      the Qvar.     Leslye Peer, MD  Electronically Signed    RSB/MedQ  DD: 02/14/2007  DT: 02/15/2007  Job #: [id number]   cc:   Janine Limbo, M.D.  Georgina Quint. Plotnikov, MD

## 2010-11-30 NOTE — Assessment & Plan Note (Signed)
[facility]                             PULMONARY OFFICE NOTE   [name], [name] Fox                         MRN:          [id number]  DATE:05/22/2007                            DOB:          [name] Fox 29, 1942    HISTORY OF PRESENT ILLNESS:  The patient is a pleasant 70 year old  female patient of Dr. Kavin Leech who has a known history of mild bilateral  lower lobe bronchiectasis with airflow limitations who presents today  for an acute office visit. The patient has been exceptionally controlled  on Qvar 40 twice daily. Up until the last week the patient complains  that approximately one week ago she started getting nasal congestion,  post-nasal drip. Now, the cough has worsened with severe coughing  paroxysms, productive cough with thick green sputum, sinus pain and  pressure. The patient has been using some cold medications without much  relief. The patient denies any hemoptysis, orthopnea, PND or leg  swelling.   PAST MEDICAL HISTORY:  Reviewed.   CURRENT MEDICATIONS:  Reviewed.   PHYSICAL EXAMINATION:  The patient is pleasant female in no acute  distress. She is afebrile and has stable vital signs. O2 saturation is  98% on room air.  HEENT: Nasal mucosa is erythematous. Maxillary sinus tenderness.  Posterior pharynx is clear.  NECK: Supple without cervical adenopathy. No JVD.  LUNGS: Lung sounds reveal some coarse breath sounds without any wheezing  or crackles.  CARDIAC: Regular rate.  ABDOMEN: Soft and nontender.  EXTREMITIES: Warm without any edema.   IMPRESSION/PLAN:  Acute rhinosinusitis and bronchiectatic flare. The  patient is to begin Levaquin 750 daily x10 days. Mucinex DM twice a day.  Nasal hygiene with normal saline and Afrin nasal spray x5 days. [place]  #20 one p.o. q12 as needed for cough. The patient will return back with  Dr. Delton Coombes as scheduled or sooner if needed.      Rubye Oaks, NP  Electronically Signed      Leslye Peer, MD  Electronically Signed   TP/MedQ  DD: 05/22/2007  DT: 05/22/2007  Job #: [id number]

## 2010-11-30 NOTE — Assessment & Plan Note (Signed)
[facility]                             PULMONARY OFFICE NOTE   NAME:[name] Fox, [name] Fox                         MRN:          [id number]  DATE:04/12/2007                            DOB:          12/12/1940    PULMONARY FOLLOW-UP VISIT:   SUBJECTIVE:  [name] Fox is a pleasant 70 year old woman with history of  mild bilateral lower lobe bronchiectasis and airflow limitation.  She  returns today for a regularly-scheduled follow-up visit.  At our last  visit we decided to decrease her Qvar to 40 mg one inhalation b.i.d.  with the hope of slowly weaning her chronic medications down to zero.  She began this trial about 3 weeks ago and tells me that she has been  experiencing a little bit more cough, especially in the afternoons.  She  is also having some associated shortness of breath.  She has not noticed  any wheezing.  She does not believe she has had any significant increase  in her postnasal drip or nasal congestion.   CURRENT MEDICATIONS:  1. Qvar 40 mg one inhalation b.i.d.  2. Nasacort one spray to each nostril daily p.r.n.  3. Claritin 10 mg daily p.r.n.  4. Albuterol two puffs q.4h. p.r.n.   PHYSICAL EXAM:  GENERAL:  This is a very pleasant, well-appearing woman  in no distress.  Her weight is 165 pounds.  Blood pressure is 128/72, heart rate 63, SPO2  96% on room air.  HEENT:  The oropharynx is benign.  NECK:  Supple without lymphadenopathy or stridor.  LUNGS:  Clear to auscultation bilaterally.  HEART:  Regular without murmur.  ABDOMEN:  Benign.  EXTREMITIES:  No cyanosis, clubbing or edema.   IMPRESSION:  1. Bronchiectasis with some mild associated airflow limitation.  She      has become a little bit more symptomatic since our last visit, and      this may be related to decreasing her to 40 mg one inhalation      b.i.d.  I have asked her to use the Qvar 40 mg two puffs in the      morning and one puff in the evening to see if this helps  her      afternoon symptoms (she has not experienced any nocturnal symptoms      or morning symptoms).  If her cough continues even after this      change, then I am suspicious that she is having more influence of      her postnasal drip and allergic rhinitis.  I have instructed her to      start taking her Claritin and Nasacort regularly and to begin nasal      saline washes if she notices her cough worsening even on two puffs      of Qvar in the morning.  She will follow up with me in January 2009      after a repeat CT      scan of her chest to look for interval change in her bronchiectasis      or sooner  if she has any difficulties in the interim.     Leslye Peer, MD  Electronically Signed    RSB/MedQ  DD: 04/12/2007  DT: 04/13/2007  Job #: [id number]   cc:   Janine Limbo, M.D.  Georgina Quint. Plotnikov, MD

## 2010-12-03 NOTE — Op Note (Signed)
NAMESELINDA, Fox NO.:  [id number]   MEDICAL RECORD NO.:  [id number]          PATIENT TYPE:  AMB   LOCATION:  [facility]C                           [facility]ILITY:  [facility]   PHYSICIAN:  [name] Fox, [name] FoxDATE OF BIRTH:  06-Apr-1941   DATE OF PROCEDURE:  04/11/2006  DATE OF DISCHARGE:                                 OPERATIVE REPORT   PREOPERATIVE DIAGNOSIS:  Vulvar intraepithelial neoplasia III.   POSTOPERATIVE DIAGNOSIS:  Vulvar intraepithelial neoplasia III.   PROCEDURE:  Partial vulvar resection.   SURGEON:  Dr. Leonard Schwartz.   ANESTHESIA:  Monitored anesthetic control and 0.50% Marcaine with  epinephrine.   DISPOSITION:  [name] Fox is a 70 year old female, gravida 0, who presents  with the above-mentioned diagnosis.  The patient understands the indications  for her surgical procedure and she accepts the risks of, but not limited to,  anesthetic complications, bleeding, infection, and possible damage to  surrounding organs.   FINDINGS:  There was an area of acetowhite tissue to the right of the  introitus approximately 2 cm caudad to the introitus and approximately 1.5  cm to the right of the rectum.  There was an area where there appeared to be  slightly atypical vessels in the same specimen.  There was a healing area  from her prior biopsy.  We attempted to obtain a frozen section so that we  can document that the margins were clear of vulvar intraepithelial  neoplasia, but we were told by the pathologist that frozen section would not  adequately determine the integrity of the margins.  Therefore, we outlined  an area that had approximately 0.50 cm of normal-appearing tissue that was  lateral to all areas of acetowhite epithelium.  The size of the area removed  measured approximately 3.5 cm x 2.75 cm.   PROCEDURE:  The patient was taken to the operating room where she was given  medication through her IV line.  The patient's lower abdomen, perineum,  and  out vagina were prepped with multiple layers of Betadine.  The bladder was  drained of urine.  Examination under anesthesia was performed.  The patient  was then sterilely draped.  The perineum was then painted with copious  amounts of acetic acid.  The area was carefully inspected and the acetowhite  lesions mentioned above was then outlined using the surgical marker.  We  obtained four biopsy sites from the 12 o'clock, 3 o'clock, 6 o'clock, and 9  o'clock positions and these were sent to pathology for frozen section.  The  pathologist called the operating room and informed us that she could not  adequately assess the margins for vulvar intraepithelial neoplasia on a  frozen section and that a permanent specimen alone would be able to properly  outline whether or not the margins were clear of disease.  We then injected  the acetowhite area with 10 mL of 0.50% Marcaine with epinephrine.  A 0.50  cm margin was then established around all acetowhite staining areas and the  epithelium only was then sharply resected.  Hemostasis was  obtained using  the Bovie cautery.  The subcutaneous layer was then closed, reapproximating  the skin edges using 4-0 Vicryl.  The skin was reapproximated using a  subcuticular suture of 3-0 Monocryl.  Steri-Strips were applied.  Sponge,  needle and instrument counts were correct on two occasions.  The estimated  blood loss for the procedure was 5 mL.  The patient tolerated her procedure  well.  She was awakened from her anesthetic and then transported to the  recovery room in stable condition.  She tolerated her procedure well.  The  specimen from the perineum was sent to pathology for permanent evaluation.  It was pinned on a cork board and labeled at the 12 o'clock, 9 o'clock, 6  o'clock, and 3 o'clock positions.   FOLLOW-UP INSTRUCTIONS:  The patient will take ibuprofen 600 mg every 6  hours as needed for pain.  She will take Darvocet-N 100 1 tablet every  6  hours as needed for severe pain.  She will sit in a tub of warm water each  day and apply antibiotic ointment to the area.  She will return to see Dr.  Stefano Fox in 2-3 weeks for follow-up examination.      [name] Fox, M.D.  Electronically Signed     AVS/MEDQ  D:  04/11/2006  T:  04/13/2006  Job:  [id number]   cc:   [name] Peer, MD  [address]  [place], [place] [address]   [name] Fox, M.D.  Fax: [phone]

## 2010-12-03 NOTE — Assessment & Plan Note (Signed)
[facility]                               PULMONARY OFFICE NOTE   ILA, LANDOWSKI                           MRN:          [id number]  DATE:03/09/2006                            DOB:          03-12-1941    SUBJECTIVE:  [name] Fox is a 70 year old woman with a history of  bronchiectasis in the bilateral lower lobes, more pronounced on the right  than on the left.  She returns today in preparation for a surgical procedure  in September with Dr. Ileene Hutchinson.  She will be under general anesthesia  for this procedure and wanted a checkup prior to that appointment.  She  tells me that she is breathing well.  She no longer has any cough since she  began using Qvar 80 mcg b.i.d.  She is not using any extra albuterol.  She  is not having difficulty with secretion clearance or retention and does not  use a flutter valve.  Her exercise tolerance is normal.   EXAM:  GENERAL:  This is a pleasant woman who is in no distress on room air.  VITAL SIGNS:  Weight is 161 pounds, temperature 97.5, blood pressure 146/82,  heart rate of 62, sPO2 of 97% on room air.  LUNGS:  Clear to auscultation bilaterally.  HEART:  Regular rate and rhythm without murmur.  ABDOMEN:  Soft and benign with normal bowel sounds.  EXTREMITIES:  No cyanosis, clubbing or edema.   MEDICATIONS:  1. Qvar 80 mcg b.i.d.  2. Claritin p.r.n.  3. Albuterol two puffs q.4 h. p.r.n.   IMPRESSION:  1. Bronchiectasis without an acute exacerbation.  She will continue her      Qvar for now.  We may decide to consider tapering her off an inhaled      corticosteroids at some point in the future, but I would not do this      prior to her surgery in September.  2. Planning for surgery with Dr. Stefano Gaul on April 11, 2006.  I would      not change her medical regimen at this time.  She does not need any      systemic corticosteroids preoperatively.  It may be beneficial for her      to have a flutter  valve to assist with secretion clearance after she is      extubated post general anesthesia.  3. [name] Fox will follow up with me in 6 months or sooner if she should      have any difficulty.                                   Leslye Peer, MD   RSB/MedQ  DD:  03/09/2006  DT:  03/10/2006  Job #:  [id number]   cc:   Janine Limbo, MD  Sonda Primes, MD

## 2010-12-03 NOTE — H&P (Signed)
NAMEIVETH, HEIDEMANN NO.:  [id number]   MEDICAL RECORD NO.:  [id number]          PATIENT TYPE:  AMB   LOCATION:  SDC                           [facility]ILITY:  [facility]   PHYSICIAN:  Janine Limbo, M.D.DATE OF BIRTH:  05-01-1941   DATE OF ADMISSION:  04/11/2006  DATE OF DISCHARGE:                                HISTORY & PHYSICAL   HISTORY OF PRESENT ILLNESS:  Ms. Kisling is a 70 year old female, para 0, who  presents for resection of vulvar intraepithelial neoplasia III.  The patient  was noted to have a rash on her right vulva.  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   PAST MEDICAL HISTORY:  The patient had an ovarian cyst removed via  laparoscopy in 1980.  She currently has asthma and bronchiectasis.  She is  being treated with Qvar 800 mcg twice a day, Claritin p.r.n. and albuterol 2  puffs every four hours as needed.  The patient also reports having her  wisdom teeth removed.  She has had a breast reduction with implants.  She  had a ruptured breast implant that had to be removed and then reconstructed.  The patient also takes Nortriptyline 25 mg each day.   ALLERGIES:  PENICILLIN and SULFA medications.   SOCIAL HISTORY:  The patient drinks alcohol socially.  She denies cigarette  use and other recreational drug uses.   REVIEW OF SYSTEMS:  The patient has menopausal symptoms.  She also has GI  complaints.   FAMILY HISTORY:  Noncontributory.   PHYSICAL EXAMINATION:  Weight is 165 pounds.  HEENT is within normal limits.  The chest is clear.  Heart regular rate and rhythm.  Her abdomen is soft and  nontender.  Her breasts have scars from prior surgery.  There is no breast  tissue present.  Abdomen is nontender.  Her extremities are grossly normal  and her neurologic exam is grossly normal.  PELVIC EXAM:  External genitalia shows an area of dry skin to the right and  south of the introitus at approximately the 7 o'clock position.  The vagina  is normal  except for atrophic changes and pelvic relaxation.  Cervix is  nontender.  The uterus is normal size, shape and consistency.  Adnexa no  masses and rectovaginal exam confirms.   ASSESSMENT:  1. Vulvar intraepithelial neoplasia III.  2. Asthma.   PLAN:  The patient will undergo a wide local incision of vulvar  intraepithelial neoplasia III.  She understands the indications for her  surgical procedure and she accepts the risks of, but not limited to,  anesthetic complications, bleeding, infections and possible damage to the  surrounding organ.      Janine Limbo, M.D.  Electronically Signed     AVS/MEDQ  D:  04/10/2006  T:  04/10/2006  Job:  [id number]   cc:   Leslye Peer, MD  [address]  [place], [place] [address]   Carola J. Gerri Spore, M.D.  Fax: [phone]

## 2010-12-15 ENCOUNTER — Ambulatory Visit: Payer: Medicare Other

## 2010-12-21 ENCOUNTER — Ambulatory Visit (INDEPENDENT_AMBULATORY_CARE_PROVIDER_SITE_OTHER): Payer: Medicare Other

## 2010-12-21 ENCOUNTER — Other Ambulatory Visit: Payer: Self-pay | Admitting: Internal Medicine

## 2010-12-21 DIAGNOSIS — E538 Deficiency of other specified B group vitamins: Secondary | ICD-10-CM

## 2010-12-21 MED ORDER — CYANOCOBALAMIN 1000 MCG/ML IJ SOLN
1000.0000 ug | Freq: Once | INTRAMUSCULAR | Status: AC
  Administered 2010-12-21: 1000 ug via INTRAMUSCULAR

## 2010-12-23 ENCOUNTER — Encounter: Payer: Self-pay | Admitting: Internal Medicine

## 2010-12-23 ENCOUNTER — Ambulatory Visit (INDEPENDENT_AMBULATORY_CARE_PROVIDER_SITE_OTHER): Payer: Medicare Other | Admitting: Internal Medicine

## 2010-12-23 VITALS — BP 140/86 | HR 81 | Temp 97.5°F | Wt 152.0 lb

## 2010-12-23 DIAGNOSIS — J209 Acute bronchitis, unspecified: Secondary | ICD-10-CM

## 2010-12-23 DIAGNOSIS — R197 Diarrhea, unspecified: Secondary | ICD-10-CM

## 2010-12-23 DIAGNOSIS — F411 Generalized anxiety disorder: Secondary | ICD-10-CM

## 2010-12-23 DIAGNOSIS — F419 Anxiety disorder, unspecified: Secondary | ICD-10-CM

## 2010-12-23 MED ORDER — AZITHROMYCIN 250 MG PO TABS: 250.0000 mg | ORAL_TABLET | Freq: Every day | ORAL | Status: DC

## 2010-12-23 MED ORDER — ALBUTEROL SULFATE HFA 108 (90 BASE) MCG/ACT IN AERS: 2.0000 | INHALATION_SPRAY | RESPIRATORY_TRACT | Status: DC | PRN

## 2010-12-23 NOTE — Assessment & Plan Note (Signed)
exac by marital stress - considering separation On low dose SSRI for same since 08/2010 - has improved "pseudodemnia" symptoms Refer for personal behav health counseling (prior marital therapy but never individual) F/u 6-8wks, sooner if problems

## 2010-12-23 NOTE — Patient Instructions (Signed)
It was good to see you today. Zpak and refill on ProAir inhaler - Your prescription(s) have been submitted to your pharmacy. Please take as directed and contact our office if you believe you are having problem(s) with the medication(s). Try Imodium as discussed we'll make referral to behavioral health for personal counseling. Our office will contact you regarding appointment(s) once made. Please schedule followup in 6-8 weeks, call sooner if problems.

## 2010-12-23 NOTE — Progress Notes (Signed)
Subjective:    Patient ID: [name] Fox, female    DOB: 21-May-1941, 70 y.o.   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 improving "fuzzy head" symptoms but still mild forgetfulness.  Feels improved "nervous" and "anxious" - noted by spouse and son no $ errors or driving mistakes; notes continued stress with spouse - s/p marital counseling - still feels "micromanaged" - no longer "livable" has support of friends and church- not in personal counseling  Borderline B12 defic - dx2/2012 - on IM replacement for same  dyslipidemia - on statin tx for same - follows low fat diet and exercises regularly - also uses fish oil - the patient reports compliance with medication(s) as prescribed. Denies adverse side effects.  arthritis -knee L>R occ swelling and pain L>R knee, esp while sitting prolonged car trip pain controlled with ASA as needed - improved when wears neoprene sleeve  hx keloid formation over left breast scar silicone breast implants removed > 48yr ago redness and pain over scar around 11/2009; saw surg 12/18/09 with bx: keloid, no cancer  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 in many mos - (until now) see above not needing rescue inhaler - follows with pulm- [facility] - as needed for same   Past Medical History  Diagnosis Date  . HERPES ZOSTER   . HEARING LOSS, BILATERAL   . OSTEOPENIA   . VITAMIN B12 DEFICIENCY   . Memory loss   . ALLERGIC RHINITIS   . ANXIETY   . DEGENERATIVE JOINT DISEASE, KNEES,  BILATERAL   . DYSLIPIDEMIA     Review of Systems  Constitutional: Positive for fatigue.  Eyes: Negative for visual disturbance.  Respiratory: Positive for cough. Negative for shortness of breath and wheezing.   Cardiovascular: Negative for chest pain.  Neurological: Negative for headaches.       Objective:   Physical Exam BP 140/86  Pulse 81  Temp(Src) 97.5 F (36.4 C) (Oral)  Wt 152 lb (68.947 kg)  SpO2 96% Physical Exam  Constitutional: She is oriented to person, place, and time. She appears well-developed and well-nourished. Coughing -min distress.   Neck: Normal range of motion. Neck supple. No JVD present. No thyromegaly present.  Cardiovascular: Normal rate, regular rhythm and normal heart sounds.  No murmur heard. No BLE edema. Pulmonary/Chest: Effort normal and breath sounds with few rhonchi bilaterally. No respiratory distress. She has no wheezes.  Psychiatric: She has a anxious mood and affect. Her behavior is normal, occ tearful - Good insight. Judgment and thought content normal.        Lab Results  Component Value Date   WBC 6.6 08/31/2010   HGB 13.5 08/31/2010   HCT 39.0 08/31/2010   PLT 391.0 08/31/2010   CHOL 290* 03/02/2010   TRIG 67.0 03/02/2010   HDL 69.30 03/02/2010   LDLDIRECT 212.7 03/02/2010   ALT 18 08/31/2010   AST 22 08/31/2010   NA 137 08/31/2010   K 4.5 08/31/2010   CL 101 08/31/2010   CREATININE 0.6 08/31/2010   BUN  11 08/31/2010   CO2 30 08/31/2010   TSH 1.62 08/31/2010      Assessment & Plan:  Acute bronchitis - flare of bronchiectasis - Zpak and refill on Albuterol MDI - hold steroid as no bronchospasm at this time  Diarrhea - susepct IBS with increase stress, no red flags on hx or exam - 08/2010 labs ok - try OTC immodium and follow up if unimproved or worse  Situational anxiety/depression - pending separation? - cont same SSRI and refer to behav health for counseling and support

## 2010-12-27 ENCOUNTER — Other Ambulatory Visit: Payer: Self-pay

## 2010-12-27 MED ORDER — AZITHROMYCIN 250 MG PO TABS: 250.0000 mg | ORAL_TABLET | Freq: Every day | ORAL | Status: AC

## 2010-12-27 NOTE — Telephone Encounter (Signed)
Pt advised, rx sent 

## 2010-12-27 NOTE — Telephone Encounter (Signed)
Ok to send - if still unimproved, call for refer or reeval - thanks

## 2010-12-27 NOTE — Telephone Encounter (Signed)
Pt called stating she is still having productive cough and sinus congestion. Pt is requesting refill of Z-pak.

## 2011-01-03 ENCOUNTER — Ambulatory Visit (INDEPENDENT_AMBULATORY_CARE_PROVIDER_SITE_OTHER): Payer: No Typology Code available for payment source | Admitting: Psychology

## 2011-01-03 DIAGNOSIS — Z630 Problems in relationship with spouse or partner: Secondary | ICD-10-CM

## 2011-01-03 DIAGNOSIS — F411 Generalized anxiety disorder: Secondary | ICD-10-CM

## 2011-01-12 ENCOUNTER — Encounter: Payer: Self-pay | Admitting: Internal Medicine

## 2011-01-12 ENCOUNTER — Ambulatory Visit (INDEPENDENT_AMBULATORY_CARE_PROVIDER_SITE_OTHER): Payer: Medicare Other | Admitting: Internal Medicine

## 2011-01-12 DIAGNOSIS — E785 Hyperlipidemia, unspecified: Secondary | ICD-10-CM

## 2011-01-12 DIAGNOSIS — F411 Generalized anxiety disorder: Secondary | ICD-10-CM

## 2011-01-12 DIAGNOSIS — E538 Deficiency of other specified B group vitamins: Secondary | ICD-10-CM

## 2011-01-12 DIAGNOSIS — J479 Bronchiectasis, uncomplicated: Secondary | ICD-10-CM

## 2011-01-12 MED ORDER — VITAMIN B-12 100 MCG PO TABS: 100.0000 ug | ORAL_TABLET | Freq: Every day | ORAL | Status: DC

## 2011-01-12 MED ORDER — PRAVASTATIN SODIUM 20 MG PO TABS: 20.0000 mg | ORAL_TABLET | Freq: Every day | ORAL | Status: DC

## 2011-01-12 MED ORDER — SERTRALINE HCL 25 MG PO TABS: 25.0000 mg | ORAL_TABLET | Freq: Every day | ORAL | Status: DC

## 2011-01-12 NOTE — Progress Notes (Signed)
Subjective:    Patient ID: [name] Fox, female    DOB: 06-10-1941, 70 y.o.   MRN: [id number]  HPI  Here for follow up - reviewed chronic medical issues:  "psuedodementia" - started on low dose SSRI for same 08/31/10 after screening labs unremarkable; MRI brain 10/2010 WNL. improving "fuzzy head" symptoms but still mild forgetfulness.  Feels improved "nervous" and "anxious" - noted by spouse and son no $ errors or driving mistakes; notes continued stress with spouse - s/p marital counseling - still feels "micromanaged" - no longer "livable" has support of friends and church- started personal counseling 11/2010 with [facility] ([facility])  Borderline B12 defic - dx2/2012 - on IM replacement for same -? stop to try oral replacment  dyslipidemia - on statin tx for same - follows low fat diet and exercises regularly - also uses fish oil - the patient reports compliance with medication(s) as prescribed. Denies adverse side effects.  arthritis -knee L>R occ swelling and pain L>R knee, esp while sitting prolonged car trip pain controlled with ASA as needed - improved when wears neoprene sleeve  hx keloid formation over left breast scar silicone breast implants removed > 57yr ago redness and pain over scar around 11/2009; saw surg 12/18/09 with bx: keloid, no cancer  bronchesctasis hx - Stable symptoms since flare 10/2010 - not needing rescue inhaler at this time- follows with pulm- [facility] - as needed for same   Past Medical History  Diagnosis Date  . HERPES ZOSTER   . HEARING LOSS, BILATERAL   . OSTEOPENIA   . VITAMIN B12 DEFICIENCY   . Memory loss   . ALLERGIC RHINITIS   . ANXIETY   . DEGENERATIVE JOINT DISEASE, KNEES, BILATERAL   . DYSLIPIDEMIA     Review of Systems  Constitutional: Negative for fatigue and unexpected weight change.  Respiratory: Negative for cough, shortness of breath and wheezing.   Cardiovascular: Negative for chest pain.  Neurological: Negative for headaches.         Objective:   Physical Exam  BP 112/70  Pulse 64  Temp(Src) 97 F (36.1 C) (Oral)  Ht 5\' 7"  (1.702 m)  Wt 150 lb 3.2 oz (68.13 kg)  BMI 23.52 kg/m2  SpO2 96% Physical Exam  Constitutional: She is oriented to person, place, and time. She appears well-developed and well-nourished. Coughing -min distress.   Neck: Normal range of motion. Neck supple. No JVD present. No thyromegaly present.  Cardiovascular: Normal rate, regular rhythm and normal heart sounds.  No murmur heard. No BLE edema. Pulmonary/Chest: Effort normal and breath sounds normal bilaterally. No respiratory distress. She has no wheezes.  Psychiatric: She has a anxious mood and affect. Her behavior is normal. Good insight. Judgment and thought content normal.       Lab Results  Component Value Date   WBC 6.6 08/31/2010   HGB 13.5 08/31/2010   HCT 39.0 08/31/2010   PLT 391.0 08/31/2010   CHOL 290* 03/02/2010   TRIG 67.0 03/02/2010   HDL 69.30 03/02/2010   LDLDIRECT 212.7 03/02/2010   ALT 18 08/31/2010   AST 22 08/31/2010   NA 137 08/31/2010   K 4.5 08/31/2010   CL 101 08/31/2010   CREATININE 0.6 08/31/2010   BUN 11 08/31/2010   CO2 30 08/31/2010   TSH 1.62 08/31/2010   Lab Results  Component Value Date   VITAMINB12 220 08/31/2010    Assessment & Plan:  See problem list. Medications and labs reviewed today. Time spent with  pt  today 25 minutes, greater than 50% time spent counseling patient on marital stress, depression and medication review. Also review of prior records

## 2011-01-12 NOTE — Patient Instructions (Signed)
It was good to see you today. We have reviewed your prior records including labs and tests today Stop B12 shots and take 100mg  pill daily Refill on medication(s) as discussed today. Please schedule followup in 3-6 months, call sooner if problems. Will check b12 level and cholesterol at that time

## 2011-01-12 NOTE — Assessment & Plan Note (Signed)
Lab Results  Component Value Date   VITAMINB12 220 08/31/2010   started monthly IM replacement 08/2010 for same - ?contribution to "pseduodementia" symptoms Overall improved - will try oral replacement at pt preference, stop IM and recheck level in 3-6 months

## 2011-01-12 NOTE — Assessment & Plan Note (Signed)
exac by marital stress - considering separation On low dose SSRI for same since 08/2010 - has improved "pseudodementia" symptoms s/p personal behav health counseling with [facility] ([facility]) - feels well (prior marital therapy but never individual) Support provided today

## 2011-01-12 NOTE — Assessment & Plan Note (Signed)
Stable since resolution of flare 10/2010 - The current medical regimen is effective;  continue present plan and medications. Follows with pulm annually and prn

## 2011-01-12 NOTE — Assessment & Plan Note (Signed)
On prava for same - tol well The current medical regimen is effective;  continue present plan and medications.

## 2011-04-01 ENCOUNTER — Ambulatory Visit (INDEPENDENT_AMBULATORY_CARE_PROVIDER_SITE_OTHER): Payer: No Typology Code available for payment source | Admitting: Internal Medicine

## 2011-04-01 ENCOUNTER — Encounter: Payer: Self-pay | Admitting: Internal Medicine

## 2011-04-01 VITALS — BP 110/72 | HR 66 | Temp 98.6°F | Ht 67.0 in | Wt 155.0 lb

## 2011-04-01 DIAGNOSIS — N390 Urinary tract infection, site not specified: Secondary | ICD-10-CM

## 2011-04-01 LAB — POCT URINALYSIS DIPSTICK
Bilirubin, UA: NEGATIVE
Ketones, UA: NEGATIVE
pH, UA: 5

## 2011-04-01 MED ORDER — CIPROFLOXACIN HCL 500 MG PO TABS: 500.0000 mg | ORAL_TABLET | Freq: Two times a day (BID) | ORAL | Status: AC

## 2011-04-01 NOTE — Patient Instructions (Signed)
It was good to see you today. cipro antibiotics for UTI symptoms  Care of a Urinary Tract Infection (UTI) A urinary tract infection (UTI) is often caused by a germ (bacteria). A UTI is usually helped with medicine (antibiotics) that kills germs. Take all the medicine until it is gone. Do this even if you or your child is feeling better. You are usually better in 7 to 10 days. HOME CARE  Drink enough water and fluids to keep your pee (urine) clear or pale yellow. Drink:   Cranberry juice.   Water.   Avoid:   Caffeine.   Tea.     Bubbly (carbonated) drinks.   Alcohol.      Only take medicine as told by your doctor.  FINDING OUT THE RESULTS OF YOUR TEST Ask your doctor when your or your child's test results will be ready. Make sure you follow up and get the test results.   TO PREVENT FURTHER INFECTIONS:  Pee often.   After pooping (bowel movement), women should wipe from front to back. Use each tissue only once.   Pee before and after having sex.  GET HELP RIGHT AWAY IF:  There is very bad back pain or lower belly (abdominal) pain.   You or your child get the chills.   You or your child has a temperature by mouth above 101, not controlled by medicine.   Your baby is older than 3 months with a rectal temperature of 102 F (38.9 C) or higher.   Your baby is 54 months old or younger with a rectal temperature of 100.4 F (38 C) or higher.   You or your child feels sick to their stomach (nauseous) or throws up (vomits).   There is continued burning with peeing.   Your or your child's problems are not better in 3 days. Return sooner if you or your child is getting worse.  MAKE SURE YOU:  Understand these instructions.   Will watch this condition.   Will get help right away if you or your child is not doing well or gets worse.  Document Released: 12/21/2007 Document Re-Released: 09/28/2009 [employer] Patient Information 2011 [place], [place].

## 2011-04-01 NOTE — Progress Notes (Signed)
  Subjective:    Patient ID: [name] Fox, female    DOB: 1941-06-06, 70 y.o.   MRN: [id number]  Urinary Tract Infection  This is a recurrent problem. The current episode started in the past 7 days. The problem occurs every urination. The problem has been gradually worsening. The quality of the pain is described as aching. The pain is mild. There has been no fever. She is not sexually active. There is no history of pyelonephritis. Associated symptoms include frequency. Pertinent negatives include no flank pain, hematuria, hesitancy, nausea or sweats. She has tried increased fluids for the symptoms. The treatment provided mild relief. Her past medical history is significant for recurrent UTIs.    Past Medical History  Diagnosis Date  . HERPES ZOSTER   . HEARING LOSS, BILATERAL   . OSTEOPENIA   . VITAMIN B12 DEFICIENCY   . Memory loss   . ALLERGIC RHINITIS   . ANXIETY   . DEGENERATIVE JOINT DISEASE, KNEES, BILATERAL   . DYSLIPIDEMIA     Review of Systems  Gastrointestinal: Negative for nausea.  Genitourinary: Positive for frequency. Negative for hesitancy, hematuria and flank pain.       Objective:   Physical Exam  BP 110/72  Pulse 66  Temp(Src) 98.6 F (37 C) (Oral)  Ht 5\' 7"  (1.702 m)  Wt 155 lb (70.308 kg)  BMI 24.28 kg/m2  SpO2 97% Constitutional: She appears well-developed and well-nourished. No distress.   Neck: Normal range of motion. Neck supple. No JVD present. No thyromegaly present.  Cardiovascular: Normal rate, regular rhythm and normal heart sounds.  No murmur heard. No BLE edema. Pulmonary/Chest: Effort normal and breath sounds normal. No respiratory distress. She has no wheezes.  Abdominal: Soft. Bowel sounds are normal. She exhibits no distension. There is no tenderness. no masses Psychiatric: She has a normal mood and affect. Her behavior is normal. Judgment and thought content normal.       Assessment & Plan:  UTI, hx same. +Udip, classic symptoms -  Cipro, no Ucx at this time unless resistant to tx

## 2011-04-26 ENCOUNTER — Ambulatory Visit: Payer: No Typology Code available for payment source | Admitting: Internal Medicine

## 2011-05-06 ENCOUNTER — Encounter: Payer: Self-pay | Admitting: Internal Medicine

## 2011-05-13 ENCOUNTER — Encounter: Payer: Self-pay | Admitting: Internal Medicine

## 2011-05-13 ENCOUNTER — Ambulatory Visit (INDEPENDENT_AMBULATORY_CARE_PROVIDER_SITE_OTHER): Payer: No Typology Code available for payment source | Admitting: Internal Medicine

## 2011-05-13 VITALS — BP 112/78 | HR 66 | Temp 98.0°F | Wt 157.8 lb

## 2011-05-13 DIAGNOSIS — Z23 Encounter for immunization: Secondary | ICD-10-CM

## 2011-05-13 DIAGNOSIS — H9190 Unspecified hearing loss, unspecified ear: Secondary | ICD-10-CM

## 2011-05-13 DIAGNOSIS — E785 Hyperlipidemia, unspecified: Secondary | ICD-10-CM

## 2011-05-13 DIAGNOSIS — F411 Generalized anxiety disorder: Secondary | ICD-10-CM

## 2011-05-13 DIAGNOSIS — H9193 Unspecified hearing loss, bilateral: Secondary | ICD-10-CM

## 2011-05-13 NOTE — Progress Notes (Signed)
Subjective:    Patient ID: [name] Fox, female    DOB: 10/30/1940, 70 y.o.   MRN: [id number]  HPI  Here for follow up - reviewed chronic medical issues:  Anxiety - manifest as "psuedodementia" -  started on low dose SSRI for same 08/31/10 after screening labs unremarkable; MRI brain 10/2010 WNL. improving "fuzzy head" symptoms but still mild forgetfulness.  Feels less "nervous" and "anxious" - same noted by spouse and son no $ errors or driving mistakes; notes intermittent but continued stress with spouse - s/p marital counseling - still feels "micromanaged" - has support of friends and church- started personal counseling 11/2010 with [facility] ([facility])  Borderline B12 defic - dx2/2012 - IM replacement x 4 mo for same, now oral since 12/2010 -   dyslipidemia - on statin tx for same - follows low fat diet and exercises regularly - also uses fish oil - the patient reports compliance with medication(s) as prescribed. Denies adverse side effects.  arthritis -knee L>R occ swelling and pain L>R knee, esp while sitting prolonged car trip pain controlled with ASA as needed - improved when wears neoprene sleeve  hx keloid formation over left breast scar - 2011 silicone breast implants removed > 48yr ago redness and pain over scar around 11/2009; saw surg 12/18/09 with bx: keloid, no cancer Declines mammo as "no breast tissue to check"  bronchesctasis hx - Stable symptoms since flare 10/2010 - not needing rescue inhaler at this time- follows with pulm- [facility] - as needed for same   Past Medical History  Diagnosis Date  . HERPES ZOSTER   . HEARING LOSS, BILATERAL   . OSTEOPENIA   . VITAMIN B12 DEFICIENCY   . Memory loss   . ALLERGIC RHINITIS   . ANXIETY   . DEGENERATIVE JOINT DISEASE, KNEES, BILATERAL   . DYSLIPIDEMIA     Review of Systems  Constitutional: Negative for fatigue and unexpected weight change.  HENT: Positive for hearing loss (subacute, bilaterally).   Respiratory:  Negative for cough, shortness of breath and wheezing.   Cardiovascular: Negative for chest pain.  Neurological: Negative for headaches.       Objective:   Physical Exam  BP 112/78  Pulse 66  Temp(Src) 98 F (36.7 C) (Oral)  Wt 157 lb 12.8 oz (71.578 kg)  SpO2 96% Wt Readings from Last 3 Encounters:  05/13/11 157 lb 12.8 oz (71.578 kg)  04/01/11 155 lb (70.308 kg)  01/12/11 150 lb 3.2 oz (68.13 kg)   Constitutional: She  appears well-developed and well-nourished. No distress.   Neck: Normal range of motion. Neck supple. No JVD present. No thyromegaly present.  Cardiovascular: Normal rate, regular rhythm and normal heart sounds.  No murmur heard. No BLE edema. Pulmonary/Chest: Effort normal and breath sounds normal bilaterally. No respiratory distress. She has no wheezes.  Psychiatric: She has a anxious mood and affect. Her behavior is normal. Good insight. Judgment and thought content normal.       Lab Results  Component Value Date   WBC 6.6 08/31/2010   HGB 13.5 08/31/2010   HCT 39.0 08/31/2010   PLT 391.0 08/31/2010   CHOL 290* 03/02/2010   TRIG 67.0 03/02/2010   HDL 69.30 03/02/2010   LDLDIRECT 212.7 03/02/2010   ALT 18 08/31/2010   AST 22 08/31/2010   NA 137 08/31/2010   K 4.5 08/31/2010   CL 101 08/31/2010   CREATININE 0.6 08/31/2010   BUN 11 08/31/2010   CO2 30 08/31/2010  TSH 1.62 08/31/2010   Lab Results  Component Value Date   VITAMINB12 220 08/31/2010    Assessment & Plan:  See problem list. Medications and labs reviewed today.

## 2011-05-13 NOTE — Assessment & Plan Note (Signed)
On prava for same - tol well The current medical regimen is effective;  continue present plan and medications.  Check annual labs now

## 2011-05-13 NOTE — Assessment & Plan Note (Signed)
exac by marital stress - intermittently considering separation On low dose SSRI for same since 08/2010 - has improved "pseudodementia" symptoms s/p personal behav health counseling with [facility] ([facility]) - feels well The current medical regimen is effective;  continue present plan and medications.

## 2011-05-13 NOTE — Patient Instructions (Signed)
It was good to see you today. Medications reviewed, no changes at this time. Test(s) ordered today. Your results will be called to you after review (48-72hours after test completion). If any changes need to be made, you will be notified at that time. we'll make referral to Dr. Dorma Russell for hearing check. Our office will contact you regarding appointment(s) once made. Review your records and let us know if you have had the Shingles vaccine - if not previously done, call here to arrange administration Please schedule followup in 6 months for medication review, call sooner if problems.

## 2011-08-16 ENCOUNTER — Other Ambulatory Visit: Payer: Self-pay | Admitting: *Deleted

## 2011-08-16 MED ORDER — SERTRALINE HCL 25 MG PO TABS: 25.0000 mg | ORAL_TABLET | Freq: Every day | ORAL | Status: DC

## 2011-08-19 ENCOUNTER — Telehealth: Payer: Self-pay | Admitting: *Deleted

## 2011-08-19 MED ORDER — SERTRALINE HCL 25 MG PO TABS: 25.0000 mg | ORAL_TABLET | Freq: Every day | ORAL | Status: DC

## 2011-08-19 NOTE — Telephone Encounter (Signed)
Pt call and stated she has change pharmacy to [facility]/[place]. Need new rx sent on her Sertraline to pharmacy. Inform pt will send... 08/19/11@11 :57am/LMB

## 2011-09-30 ENCOUNTER — Ambulatory Visit (INDEPENDENT_AMBULATORY_CARE_PROVIDER_SITE_OTHER)
Admission: RE | Admit: 2011-09-30 | Discharge: 2011-09-30 | Disposition: A | Discharge status: Home / Self Care | Payer: Medicare Other | Source: Ambulatory Visit | Attending: Internal Medicine | Admitting: Internal Medicine

## 2011-09-30 ENCOUNTER — Encounter: Payer: Self-pay | Admitting: Internal Medicine

## 2011-09-30 ENCOUNTER — Ambulatory Visit (INDEPENDENT_AMBULATORY_CARE_PROVIDER_SITE_OTHER): Payer: Medicare Other | Admitting: Internal Medicine

## 2011-09-30 VITALS — BP 130/78 | HR 75 | Temp 97.2°F | Ht 67.0 in | Wt 166.0 lb

## 2011-09-30 DIAGNOSIS — M79609 Pain in unspecified limb: Secondary | ICD-10-CM

## 2011-09-30 DIAGNOSIS — M79671 Pain in right foot: Secondary | ICD-10-CM

## 2011-09-30 MED ORDER — DICLOFENAC SODIUM 75 MG PO TBEC: 75.0000 mg | DELAYED_RELEASE_TABLET | Freq: Two times a day (BID) | ORAL | Status: DC

## 2011-09-30 NOTE — Patient Instructions (Signed)
It was good to see you today. Voltaren 2x/day x 1 week then as needed - Your prescription(s) have been submitted to your pharmacy. Please take as directed and contact our office if you believe you are having problem(s) with the medication(s). Test(s) ordered today. Your results will be called to you after review. If any changes need to be made, you will be notified at that time. Ice to painful foot as discussed, rest and call if worse or unimproved on treatment

## 2011-09-30 NOTE — Progress Notes (Signed)
Subjective:    Patient ID: [name] Fox, female    DOB: 09-15-1940, 71 y.o.   MRN: [id number]  Foot Pain This is a new problem. The current episode started in the past 7 days. The problem occurs constantly. The problem has been gradually worsening. Pertinent negatives include no chest pain, fatigue, fever, headaches, joint swelling, rash or swollen glands. The symptoms are aggravated by exertion, standing and walking. She has tried ice for the symptoms. The treatment provided mild relief.   Also reviewed chronic medical issues:  Anxiety - manifest as "psuedodementia" -  started on low dose SSRI for same 08/31/10 after screening labs unremarkable; MRI brain 10/2010 WNL. improving "fuzzy head" symptoms but still mild forgetfulness.  Feels less "nervous" and "anxious" - same noted by spouse and son no $ errors or driving mistakes; notes intermittent but continued stress with spouse - s/p marital counseling - still feels "micromanaged" - has support of friends and church- started personal counseling 11/2010 with [facility] ([facility])  Borderline B12 defic - dx2/2012 - IM replacement x 4 mo for same, now oral since 12/2010 -   dyslipidemia - on statin tx for same - follows low fat diet and exercises regularly - also uses fish oil - the patient reports compliance with medication(s) as prescribed. Denies adverse side effects.  arthritis -knee L>R occ swelling and pain L>R knee, esp while sitting prolonged car trip pain controlled with ASA as needed - improved when wears neoprene sleeve  hx keloid formation over left breast scar - 2011 silicone breast implants removed > 20yr ago redness and pain over scar around 11/2009; saw surg 12/18/09 with bx: keloid, no cancer Declines mammo as "no breast tissue to check"  bronchiectasis  hx - Stable symptoms since flare 10/2010 - not needing rescue inhaler at this time- follows with pulm- [facility] - as needed for same   Past Medical History  Diagnosis Date  .  HERPES ZOSTER   . HEARING LOSS, BILATERAL   . OSTEOPENIA   . VITAMIN B12 DEFICIENCY   . Memory loss   . ALLERGIC RHINITIS   . ANXIETY   . DEGENERATIVE JOINT DISEASE, KNEES, BILATERAL   . DYSLIPIDEMIA     Review of Systems  Constitutional: Negative for fever, fatigue and unexpected weight change.  Cardiovascular: Negative for chest pain and leg swelling.  Musculoskeletal: Negative for back pain and joint swelling.  Skin: Negative for rash.  Neurological: Negative for headaches.       Objective:   Physical Exam  BP 130/78  Pulse 75  Temp(Src) 97.2 F (36.2 C) (Oral)  Ht 5\' 7"  (1.702 m)  Wt 166 lb (75.297 kg)  BMI 26.00 kg/m2  SpO2 95% Wt Readings from Last 3 Encounters:  09/30/11 166 lb (75.297 kg)  05/13/11 157 lb 12.8 oz (71.578 kg)  04/01/11 155 lb (70.308 kg)   Constitutional: She appears well-developed and well-nourished. No distress.   Mskel: R medial foot with 2cm diameter firm warm swelling, red and tender to touch Skin: no cellulitis, bruise or wound over swelling on medial foot Psychiatric: She has a min anxious mood and affect. Her behavior is normal. Good insight. Judgment and thought content normal.       Lab Results  Component Value Date   WBC 6.6 08/31/2010   HGB 13.5 08/31/2010   HCT 39.0 08/31/2010   PLT 391.0 08/31/2010   CHOL 290* 03/02/2010   TRIG 67.0 03/02/2010   HDL 69.30 03/02/2010   LDLDIRECT 212.7  03/02/2010   ALT 18 08/31/2010   AST 22 08/31/2010   NA 137 08/31/2010   K 4.5 08/31/2010   CL 101 08/31/2010   CREATININE 0.6 08/31/2010   BUN 11 08/31/2010   CO2 30 08/31/2010   TSH 1.62 08/31/2010     Assessment & Plan:   R foot pain - ?mild skel injury, stress fx or tendonitis - Check xray tx NSAIDs - voltaren BID x 7 d Ice pack TID x 72h, then prn

## 2011-10-31 ENCOUNTER — Other Ambulatory Visit (INDEPENDENT_AMBULATORY_CARE_PROVIDER_SITE_OTHER): Payer: Medicare Other

## 2011-10-31 ENCOUNTER — Ambulatory Visit (INDEPENDENT_AMBULATORY_CARE_PROVIDER_SITE_OTHER): Payer: Medicare Other | Admitting: Internal Medicine

## 2011-10-31 ENCOUNTER — Other Ambulatory Visit: Payer: Self-pay | Admitting: Internal Medicine

## 2011-10-31 ENCOUNTER — Encounter: Payer: Self-pay | Admitting: Internal Medicine

## 2011-10-31 VITALS — BP 124/62 | HR 75 | Temp 98.4°F | Resp 16 | Ht 67.0 in | Wt 161.0 lb

## 2011-10-31 DIAGNOSIS — R51 Headache: Secondary | ICD-10-CM

## 2011-10-31 DIAGNOSIS — E785 Hyperlipidemia, unspecified: Secondary | ICD-10-CM

## 2011-10-31 DIAGNOSIS — R5381 Other malaise: Secondary | ICD-10-CM

## 2011-10-31 DIAGNOSIS — R519 Headache, unspecified: Secondary | ICD-10-CM

## 2011-10-31 DIAGNOSIS — R5383 Other fatigue: Secondary | ICD-10-CM

## 2011-10-31 DIAGNOSIS — M2550 Pain in unspecified joint: Secondary | ICD-10-CM

## 2011-10-31 DIAGNOSIS — M353 Polymyalgia rheumatica: Secondary | ICD-10-CM | POA: Insufficient documentation

## 2011-10-31 DIAGNOSIS — M316 Other giant cell arteritis: Secondary | ICD-10-CM

## 2011-10-31 LAB — LIPID PANEL
HDL: 53.7 mg/dL (ref >39.00)
Total CHOL/HDL Ratio: 3
VLDL: 10.6 mg/dL (ref 0.0–40.0)

## 2011-10-31 LAB — HEPATIC FUNCTION PANEL
Albumin: 3.2 g/dL — ABNORMAL LOW (ref 3.5–5.2)
Total Protein: 7.5 g/dL (ref 6.0–8.3)

## 2011-10-31 LAB — BASIC METABOLIC PANEL
Chloride: 94 mEq/L — ABNORMAL LOW (ref 96–112)
Potassium: 4.8 mEq/L (ref 3.5–5.1)

## 2011-10-31 LAB — CBC WITH DIFFERENTIAL/PLATELET
Basophils Relative: 0.5 % (ref 0.0–3.0)
Eosinophils Relative: 0.5 % (ref 0.0–5.0)
HCT: 34.1 % — ABNORMAL LOW (ref 36.0–46.0)
Lymphs Abs: 1.1 10*3/uL (ref 0.7–4.0)
MCV: 86 fl (ref 78.0–100.0)
Monocytes Absolute: 1.1 10*3/uL — ABNORMAL HIGH (ref 0.1–1.0)
Neutrophils Relative %: 79.8 % — ABNORMAL HIGH (ref 43.0–77.0)
RBC: 3.97 Mil/uL (ref 3.87–5.11)
WBC: 11.5 10*3/uL — ABNORMAL HIGH (ref 4.5–10.5)

## 2011-10-31 LAB — SEDIMENTATION RATE: Sed Rate: 103 mm/hr — ABNORMAL HIGH (ref 0–22)

## 2011-10-31 MED ORDER — HYDROCODONE-ACETAMINOPHEN 5-500 MG PO TABS: 1.0000 | ORAL_TABLET | Freq: Three times a day (TID) | ORAL | Status: AC | PRN

## 2011-10-31 MED ORDER — PREDNISONE 20 MG PO TABS: 40.0000 mg | ORAL_TABLET | Freq: Every day | ORAL | Status: AC

## 2011-10-31 NOTE — Progress Notes (Signed)
Subjective:    Patient ID: [name] Fox, female    DOB: [birth year], 71 y.o.   MRN: [id number]  HPI Here for complains of headache and "swelling" B temples - tender to touch Onset 2 weeks ago - daily symptoms  unimproved with tylenol and treatment of allergic rhinitis with claritin No fever, sinus pain or drainage - No vision changes Also continued R foot pain - medial side above instep - improved pain with voltaren pills but not resolved "lump"  also reviewed chronic medical issues:  Anxiety - manifest as "psuedodementia" -  started on low dose SSRI for same 08/31/10 after screening labs unremarkable; MRI brain 10/2010 WNL. improving "fuzzy head" symptoms but still mild forgetfulness.  Feels less "nervous" and "anxious" - same noted by spouse and son no $ errors or driving mistakes; notes intermittent but continued stress with spouse - s/p marital counseling - feeling of "micromanaged" has improved- has support of friends and church- s/p personal counseling 11/2010 with [facility] ([facility])  Borderline B12 defic - dx2/2012 - IM replacement x 4 mo for same, now oral since 12/2010 -   dyslipidemia - on statin tx for same - follows low fat diet and exercises regularly - also uses fish oil - the patient reports compliance with medication(s) as prescribed. Denies adverse side effects.  hx keloid formation over left breast scar - 2011 silicone breast implants removed > 74yr ago redness and pain over scar around 11/2009; saw surg 12/18/09 with bx: keloid, no cancer Declines mammo as "no breast tissue to check"   Past Medical History  Diagnosis Date  . HERPES ZOSTER   . HEARING LOSS, BILATERAL   . OSTEOPENIA   . VITAMIN B12 DEFICIENCY   . Memory loss   . ALLERGIC RHINITIS   . ANXIETY   . DEGENERATIVE JOINT DISEASE, KNEES, BILATERAL   . DYSLIPIDEMIA     Review of Systems  Constitutional: Negative for fatigue and unexpected weight change.  Respiratory: Negative for shortness of breath  and wheezing.   Cardiovascular: Negative for chest pain.  Musculoskeletal: Negative for back pain, joint swelling and arthralgias.       Right foot pain - ongoing since 09/30/11       Objective:   Physical Exam  BP 124/62  Pulse 75  Temp(Src) 98.4 F (36.9 C) (Oral)  Resp 16  Ht 5\' 7"  (1.702 m)  Wt 161 lb (73.029 kg)  BMI 25.22 kg/m2  SpO2 94% Wt Readings from Last 3 Encounters:  10/31/11 161 lb (73.029 kg)  09/30/11 166 lb (75.297 kg)  05/13/11 157 lb 12.8 oz (71.578 kg)   Constitutional: She appears well-developed and well-nourished. No distress.   Neck: Normal range of motion. Neck supple. No JVD present. No thyromegaly present.  Cardiovascular: Normal rate, regular rhythm and normal heart sounds.  No murmur heard. No BLE edema. Pulmonary/Chest: Effort normal and breath sounds normal bilaterally. No respiratory distress. She has no wheezes.  Neurologic - AAOx4, CN 2-12 symmetrically intact - normal and fluent speech, recall, balance and coordination Skin: small nodules 5mm at B temples, min tender to palpation - not pulsating - Psychiatric: She has a normal mood and affect. Her behavior is normal. Good insight. Judgment and thought content normal.       Lab Results  Component Value Date   WBC 6.6 08/31/2010   HGB 13.5 08/31/2010   HCT 39.0 08/31/2010   PLT 391.0 08/31/2010   CHOL 290* 03/02/2010   TRIG 67.0 03/02/2010  HDL 69.30 03/02/2010   LDLDIRECT 212.7 03/02/2010   ALT 18 08/31/2010   AST 22 08/31/2010   NA 137 08/31/2010   K 4.5 08/31/2010   CL 101 08/31/2010   CREATININE 0.6 08/31/2010   BUN 11 08/31/2010   CO2 30 08/31/2010   TSH 1.62 08/31/2010   Lab Results  Component Value Date   VITAMINB12 220 08/31/2010    Assessment & Plan:   headache fatigue Mild swelling B temple region with tenderness -   ?TA - otherwise benign neuro exam ad hx Check labs and refer to rheum now (also for R foot pain which appears inflammatory in nature)

## 2011-10-31 NOTE — Patient Instructions (Signed)
It was good to see you today. Test(s) ordered today. Your results will be called to you after review (48-72hours after test completion). If any changes need to be made, you will be notified at that time. we'll make referral to rheumtology. Our office will contact you regarding appointment(s) once made. Hydrocodone to use for sever pain unrelieved by NSAIDs (advil/Aleve) or tylenol - Your prescription(s) have been submitted to your pharmacy. Please take as directed and contact our office if you believe you are having problem(s) with the medication(s). Please schedule followup in 3-4 weeks to continue review, call sooner if problems.

## 2011-10-31 NOTE — Assessment & Plan Note (Signed)
On prava for same - tol well The current medical regimen is effective;  continue present plan and medications.  

## 2011-11-01 ENCOUNTER — Encounter (INDEPENDENT_AMBULATORY_CARE_PROVIDER_SITE_OTHER): Payer: Self-pay | Admitting: Surgery

## 2011-11-01 ENCOUNTER — Other Ambulatory Visit: Payer: Self-pay | Admitting: Internal Medicine

## 2011-11-01 DIAGNOSIS — R51 Headache: Secondary | ICD-10-CM

## 2011-11-01 DIAGNOSIS — M316 Other giant cell arteritis: Secondary | ICD-10-CM

## 2011-11-02 ENCOUNTER — Ambulatory Visit (INDEPENDENT_AMBULATORY_CARE_PROVIDER_SITE_OTHER): Payer: Medicare Other | Admitting: Surgery

## 2011-11-02 ENCOUNTER — Encounter (INDEPENDENT_AMBULATORY_CARE_PROVIDER_SITE_OTHER): Payer: Self-pay | Admitting: Surgery

## 2011-11-02 VITALS — BP 146/94 | HR 72 | Temp 97.0°F | Resp 18 | Ht 67.0 in | Wt 163.0 lb

## 2011-11-02 DIAGNOSIS — M316 Other giant cell arteritis: Secondary | ICD-10-CM

## 2011-11-02 NOTE — Progress Notes (Signed)
Re:   [name] Fox DOB:   06-Jun-1941 MRN:   [id number]  ASSESSMENT AND PLAN: 1.  For consult for temporal artery biopsy.  I discussed the indication and complications of TA biopsy.  Risks include bleeding, infection, and nerve injury.  To see Dr. Lurena Nida at [facility] for rheumatology later today.  2.  Headaches, elevated sed rate.  Now improved on recently started prednisone 3.  Anxiety 4.  Dyslipidemia. 5.  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 6.  Just started steroids emperically.  Chief Complaint  Patient presents with  . Biopsy    Temporal artery - evaluation   REFERRING PHYSICIAN: Rene Paci, MD, MD  HISTORY OF PRESENT ILLNESS: [name] Fox is a 71 y.o. (DOB: 07/11/1941)  white female whose primary care physician is Rene Paci, MD, MD and comes to me today for temporal artery biopsy.  The patient has had several weeks of not feeling well. She has some puffiness in her temporal artery areas. She's had headaches, more to her back of her head and front of her head.  She has not felt right.  She's had no fever or vision changes.  She sees Dr. Rene Paci as her medical doctor. Dr. Felicity Coyer obtained labs which included an elevated sed rate. She has started the patient on prednisone which appears to have helped her symptoms. Ms. Fullam has an appointment with Dr. Lurena Nida later today for rheumatology evaluation.   Past Medical History  Diagnosis Date  . HERPES ZOSTER   . HEARING LOSS, BILATERAL   . OSTEOPENIA   . VITAMIN B12 DEFICIENCY   . Memory loss   . ALLERGIC RHINITIS   . ANXIETY   . DEGENERATIVE JOINT DISEASE, KNEES, BILATERAL   . DYSLIPIDEMIA       Past Surgical History  Procedure Date  . Bilateral mastectomy     while removing cosmetic silicone implants (NO Breast cancer)  . Bilateral silicone breast explant 1993    Due to rupture/leak @ [facility]      Current Outpatient  Prescriptions  Medication Sig Dispense Refill  . albuterol (PROAIR HFA) 108 (90 BASE) MCG/ACT inhaler Inhale 2 puffs into the lungs every 4 (four) hours as needed for wheezing or shortness of breath.  8.5 g  1  . aspirin 81 MG tablet Take 81 mg by mouth daily.        Marland Kitchen dextromethorphan-guaiFENesin (MUCINEX DM) 30-600 MG per 12 hr tablet Take 1 tablet by mouth every 12 (twelve) hours.        Marland Kitchen HYDROcodone-acetaminophen (VICODIN) 5-500 MG per tablet Take 1 tablet by mouth every 8 (eight) hours as needed for pain.  30 tablet  0  . loratadine (CLARITIN) 10 MG tablet Take 10 mg by mouth daily as needed.        . Omega-3 Fatty Acids (FISH OIL) 1000 MG CAPS Take 1,000 mg by mouth daily.        . pravastatin (PRAVACHOL) 20 MG tablet Take 1 tablet (20 mg total) by mouth daily.  30 tablet  5  . predniSONE (DELTASONE) 20 MG tablet Take 2 tablets (40 mg total) by mouth daily. (or as directed)  60 tablet  1  . sertraline (ZOLOFT) 25 MG tablet Take 1 tablet (25 mg total) by mouth daily.  30 tablet  5  . vitamin B-12 (CYANOCOBALAMIN) 100 MCG tablet Take 1 tablet (100 mcg total) by mouth daily.  100 tablet  3      Allergies  Allergen Reactions  . Penicillins Shortness Of Breath, Swelling and Rash  . Sulfasalazine Rash    REVIEW OF SYSTEMS: Skin:  No history of rash.  No history of abnormal moles. Infection:  No history of hepatitis or HIV.  No history of MRSA. Neurologic: see HPI.  Now with headaches of recent onset. Cardiac:  No history of hypertension. No history of heart disease.  No history of prior cardiac catheterization.  No history of seeing a cardiologist. Pulmonary:  Myobrochi-ectasis.  Seen by Dr. Delton Coombes, but has done well the last 2 years. Breasts:  History of breast reduction at [facility] and 1993.  Saw Dr. Jamey Ripa for this ini 2011.   Endocrine:  No diabetes. No thyroid disease. Hyperlipidemia. Gastrointestinal:  No history of stomach disease.  No history of liver disease.  No  history of gall bladder disease.  No history of pancreas disease.  No history of colon disease. Urologic:  No history of kidney stones.  No history of bladder infections. Musculoskeletal:  No history of joint or back disease. Psycho-social:  The patient is oriented.   The patient has no obvious psychologic or social impairment to understanding our conversation and plan.  SOCIAL and FAMILY HISTORY: Married.  Husband with patient.  PHYSICAL EXAM: BP 146/94  Pulse 72  Temp(Src) 97 F (36.1 C) (Temporal)  Resp 18  Ht 5\' 7"  (1.702 m)  Wt 163 lb (73.936 kg)  BMI 25.53 kg/m2  General: WN older WF  who is alert and generally healthy appearing.  HEENT: Normal. Pupils equal. Good dentition.  I have trouble feeling her temporal artery on either side.  It appears to be right at the hair line.  I showed the patient and her husband the location of my incision. Neck: Supple. No mass.  No thyroid mass.  Carotid pulse okay with no bruit. Lymph Nodes:  No supraclavicular or cervical nodes. Extremities:  Good strength and ROM  in upper and lower extremities. Neurologic:  Grossly intact to motor and sensory function. Psychiatric: Has normal mood and affect. Behavior is normal.   DATA REVIEWED: Notes from Dr. Mady Haagensen.  Ovidio Kin, MD,  [facility] [address] [place]  [address]   [place], [place]    [address] Phone:  [phone] FAX:  [phone]

## 2011-11-05 ENCOUNTER — Ambulatory Visit
Admission: RE | Admit: 2011-11-05 | Discharge: 2011-11-05 | Disposition: A | Discharge status: Home / Self Care | Payer: Medicare Other | Source: Ambulatory Visit | Attending: Internal Medicine | Admitting: Internal Medicine

## 2011-11-05 DIAGNOSIS — M316 Other giant cell arteritis: Secondary | ICD-10-CM

## 2011-11-05 DIAGNOSIS — R51 Headache: Secondary | ICD-10-CM

## 2011-11-05 MED ORDER — GADOBENATE DIMEGLUMINE 529 MG/ML IV SOLN
15.0000 mL | Freq: Once | INTRAVENOUS | Status: AC | PRN
  Administered 2011-11-05: 15 mL via INTRAVENOUS

## 2011-11-07 ENCOUNTER — Ambulatory Visit (HOSPITAL_BASED_OUTPATIENT_CLINIC_OR_DEPARTMENT_OTHER): Admit: 2011-11-07 | Payer: Self-pay | Admitting: Surgery

## 2011-11-07 ENCOUNTER — Encounter (HOSPITAL_BASED_OUTPATIENT_CLINIC_OR_DEPARTMENT_OTHER): Payer: Self-pay

## 2011-11-07 SURGERY — BIOPSY TEMPORAL ARTERY
Anesthesia: General

## 2011-11-11 ENCOUNTER — Ambulatory Visit: Payer: No Typology Code available for payment source | Admitting: Internal Medicine

## 2011-11-11 ENCOUNTER — Ambulatory Visit: Payer: Self-pay | Admitting: Internal Medicine

## 2011-11-21 ENCOUNTER — Encounter: Payer: Self-pay | Admitting: Internal Medicine

## 2011-11-21 ENCOUNTER — Ambulatory Visit (INDEPENDENT_AMBULATORY_CARE_PROVIDER_SITE_OTHER): Payer: Medicare Other | Admitting: Internal Medicine

## 2011-11-21 VITALS — BP 148/80 | HR 94 | Temp 97.7°F | Wt 165.0 lb

## 2011-11-21 DIAGNOSIS — F411 Generalized anxiety disorder: Secondary | ICD-10-CM

## 2011-11-21 DIAGNOSIS — M316 Other giant cell arteritis: Secondary | ICD-10-CM

## 2011-11-21 DIAGNOSIS — G4700 Insomnia, unspecified: Secondary | ICD-10-CM

## 2011-11-21 NOTE — Patient Instructions (Addendum)
It was good to see you today. We have reviewed your prior records including labs and tests today Ok to stop sertraline and continue prednisone per Dr Dareen Piano Other medications reviewed, no changes at this time. Please schedule followup in 3-4 months, call sooner if problems.

## 2011-11-21 NOTE — Assessment & Plan Note (Signed)
Dx 10/2011 clarified - symptoms have improved on high dose pred Working with rheum on same - symptoms improved Reviewed MRI/MRA brain - no vasculitis findingd improved ESR: started 103 to 14 on 11/16/11 at [facility]

## 2011-11-21 NOTE — Progress Notes (Signed)
  Subjective:    Patient ID: [name] Fox, female    DOB: 16-Jun-1941, 71 y.o.   MRN: [id number]  HPI  Here for follow up headache Dx with TA last month - working with Dr Domenica Fail on same Has declined bx at advise of rheum - on high dose pred since 10/2011 with sed rate decrease from 103 to 14 - improved "swelling" B temples, no longer tender to touch and headache improved   Past Medical History  Diagnosis Date  . HERPES ZOSTER   . HEARING LOSS, BILATERAL   . OSTEOPENIA   . VITAMIN B12 DEFICIENCY   . Memory loss   . ALLERGIC RHINITIS   . ANXIETY   . DEGENERATIVE JOINT DISEASE, KNEES, BILATERAL   . DYSLIPIDEMIA   . Giant cell arteritis dx 10/2011    HA improved on pred, follows with rheum    Review of Systems  Constitutional: Negative for fatigue and unexpected weight change.  Respiratory: Negative for shortness of breath and wheezing.   Cardiovascular: Negative for chest pain.  Musculoskeletal: Negative for back pain, joint swelling and arthralgias.       Right foot pain - ongoing since 09/30/11       Objective:   Physical Exam  BP 148/80  Pulse 94  Temp(Src) 97.7 F (36.5 C) (Oral)  Wt 165 lb (74.844 kg)  SpO2 94% Wt Readings from Last 3 Encounters:  11/21/11 165 lb (74.844 kg)  11/02/11 163 lb (73.936 kg)  10/31/11 161 lb (73.029 kg)   Constitutional: She appears well-developed and well-nourished. No distress.  souse at side Neck: Normal range of motion. Neck supple. No JVD present. No thyromegaly present.  Cardiovascular: Normal rate, regular rhythm and normal heart sounds.  No murmur heard. No BLE edema. Pulmonary/Chest: Effort normal and breath sounds normal bilaterally. No respiratory distress. She has no wheezes.  Neurologic - AAOx4, CN 2-12 symmetrically intact - normal and fluent speech, recall, balance and coordination Psychiatric: She has a normal mood and affect. Her behavior is normal. Good insight. Judgment and thought content normal.       Lab  Results  Component Value Date   WBC 11.5* 10/31/2011   HGB 11.4* 10/31/2011   HCT 34.1* 10/31/2011   PLT 674.0* 10/31/2011   CHOL 176 10/31/2011   TRIG 53.0 10/31/2011   HDL 53.70 10/31/2011   LDLDIRECT 212.7 03/02/2010   ALT 38* 10/31/2011   AST 33 10/31/2011   NA 133* 10/31/2011   K 4.8 10/31/2011   CL 94* 10/31/2011   CREATININE 0.6 10/31/2011   BUN 10 10/31/2011   CO2 28 10/31/2011   TSH 1.15 10/31/2011   Lab Results  Component Value Date   VITAMINB12 220 08/31/2010   Lab Results  Component Value Date   ESRSEDRATE 103* 10/31/2011    Assessment & Plan:   See problem list. Medications and labs reviewed today.  Insomnia - related to high dose Pred - ok to use OTC benadryl prn

## 2011-11-21 NOTE — Assessment & Plan Note (Signed)
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

## 2012-01-24 ENCOUNTER — Telehealth: Payer: Self-pay

## 2012-01-24 DIAGNOSIS — M316 Other giant cell arteritis: Secondary | ICD-10-CM

## 2012-01-24 NOTE — Telephone Encounter (Signed)
Called pt no answer LMOM md response & recommendations.. 01/24/12@1 :15pm/LMB

## 2012-01-24 NOTE — Telephone Encounter (Signed)
[facility] practice is under direct supervision of Dr Dareen Piano, but i would suggest pt ask their office for an appointment with Dr for next visit -   however, i will also refer to new rheum (deveshwar) as requested. pt should continue to follow at [facility] until new appt can be made - thanks

## 2012-01-24 NOTE — Telephone Encounter (Signed)
Pt called requesting a call back.

## 2012-01-24 NOTE — Telephone Encounter (Signed)
Called pt back she states she would like another referral to see a rheumatologist. Was referred to see Dr. Dareen Piano, but she never see him always see the PA [facility]) and he is not satisfied. Would like to see md... 01/24/12@12 :04pm/LMB

## 2012-01-27 ENCOUNTER — Encounter: Payer: Self-pay | Admitting: Internal Medicine

## 2012-01-27 ENCOUNTER — Ambulatory Visit (INDEPENDENT_AMBULATORY_CARE_PROVIDER_SITE_OTHER): Payer: Medicare Other | Admitting: Internal Medicine

## 2012-01-27 VITALS — BP 152/90 | HR 66 | Temp 97.3°F | Ht 67.0 in | Wt 163.1 lb

## 2012-01-27 DIAGNOSIS — M316 Other giant cell arteritis: Secondary | ICD-10-CM

## 2012-01-27 DIAGNOSIS — R413 Other amnesia: Secondary | ICD-10-CM

## 2012-01-27 NOTE — Assessment & Plan Note (Signed)
Labs reviewed and MRI brain 10/2011 Increase symptoms - ?related to pred or other underlying neuro/rheum problem Refer to neuro as above (see [facility] discussion)  Also continue SSRI for "pseudodementia symptoms" as symptoms improved initially on tx for same

## 2012-01-27 NOTE — Progress Notes (Signed)
Subjective:    Patient ID: [name] Fox, female    DOB: Dec 06, 1940, 71 y.o.   MRN: [id number]  HPI  Here for follow up - reviewed chronic medical issues:  GCA/TA - dx 10/2011 in setting of  headache and "swelling" B temples - working with Dr Domenica Fail on same but considering change as "only get to see the [facility]" - [facility] -Has declined bx at advice of rheum 10/2011 - started high dose pred since 10/2011 with sed rate decrease from 103 to 14 - (esr 19 and CRP 13 on 01/25/12) - Trial off pred early 12/2011 with rapid resumption of symptoms so resumed 40 mg/d for swelling- no again weaning down pred since; improved "swelling" B temples but not resolved, no longer tender to touch and headache improved but persitisting confusion - ?MRA and neuro eval per rheum  Anxiety - manifest as "psuedodementia" -  started on low dose SSRI for same 08/31/10 after screening labs unremarkable; MRI brain 10/2010 WNL. improving "fuzzy head" symptoms but still mild forgetfulness.  Feels less "nervous" and "anxious" - same noted by spouse and son no $ errors or driving mistakes; notes intermittent but continued stress with spouse - s/p marital counseling - feeling of "micromanaged" has improved- has support of friends and church- s/p personal counseling 11/2010 with [facility] ([facility])  Borderline B12 defic - dx2/2012 - IM replacement x 4 mo for same, now oral since 12/2010 -   dyslipidemia - on statin tx for same - follows low fat diet and exercises regularly - also uses fish oil - the patient reports compliance with medication(s) as prescribed. Denies adverse side effects.  hx keloid formation over left breast scar - 2011 silicone breast implants removed > 51yr ago redness and pain over scar around 11/2009; saw surg 12/18/09 with bx: keloid, no cancer Declines mammo as "no breast tissue to check"   Past Medical History  Diagnosis Date  . HERPES ZOSTER   . HEARING LOSS, BILATERAL   .  OSTEOPENIA   . VITAMIN B12 DEFICIENCY   . Memory loss   . ALLERGIC RHINITIS   . ANXIETY   . DEGENERATIVE JOINT DISEASE, KNEES, BILATERAL   . DYSLIPIDEMIA   . Giant cell arteritis dx 10/2011    HA improved on pred, follows with rheum    Review of Systems  Constitutional: Negative for fatigue and unexpected weight change.  Respiratory: Negative for shortness of breath and wheezing.   Cardiovascular: Negative for chest pain.  Musculoskeletal: Negative for back pain, joint swelling and arthralgias.       Right foot pain - ongoing since 09/30/11       Objective:   Physical Exam  BP 152/90  Pulse 66  Temp 97.3 F (36.3 C) (Oral)  Ht 5\' 7"  (1.702 m)  Wt 163 lb 1.9 oz (73.991 kg)  BMI 25.55 kg/m2  SpO2 96% Wt Readings from Last 3 Encounters:  01/27/12 163 lb 1.9 oz (73.991 kg)  11/21/11 165 lb (74.844 kg)  11/02/11 163 lb (73.936 kg)   Constitutional: She appears well-developed and well-nourished. No distress.   Neck: Normal range of motion. Neck supple. No JVD present. No thyromegaly present.  Cardiovascular: Normal rate, regular rhythm and normal heart sounds.  No murmur heard. No BLE edema. Pulmonary/Chest: Effort normal and breath sounds normal bilaterally. No respiratory distress. She has no wheezes.  Neurologic - AAOx4, CN 2-12 symmetrically intact - normal and fluent speech, recall, balance and coordination Skin: small nodules  5mm at B temples, min tender to palpation - not pulsating - Psychiatric: She has a normal mood and affect. Her behavior is normal. Good insight. Judgment and thought content normal.       Lab Results  Component Value Date   WBC 11.5* 10/31/2011   HGB 11.4* 10/31/2011   HCT 34.1* 10/31/2011   PLT 674.0* 10/31/2011   CHOL 176 10/31/2011   TRIG 53.0 10/31/2011   HDL 53.70 10/31/2011   LDLDIRECT 212.7 03/02/2010   ALT 38* 10/31/2011   AST 33 10/31/2011   NA 133* 10/31/2011   K 4.8 10/31/2011   CL 94* 10/31/2011   CREATININE 0.6 10/31/2011   BUN 10  10/31/2011   CO2 28 10/31/2011   TSH 1.15 10/31/2011   Lab Results  Component Value Date   VITAMINB12 220 08/31/2010   Lab Results  Component Value Date   ESRSEDRATE 103* 10/31/2011   RADIOLOGY REPORT*   Clinical Data:  History of temporal arteritis.  Confusion, hearing loss, weakness and headaches for 6 weeks.  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   MRI HEAD WITHOUT AND WITH CONTRAST MRA HEAD WITHOUT CONTRAST   Technique:  Multiplanar, multiecho pulse sequences of the brain and surrounding structures were obtained without and with intravenous contrast.  Angiographic images of the head were obtained using MRA technique without contrast.   Contrast: 15mL MULTIHANCE GADOBENATE DIMEGLUMINE 529 MG/ML IV SOLN   Comparison:   MRI brain 10/26/2010.   MRI HEAD WITHOUT AND WITH CONTRAST   Findings:  There is no evidence for acute infarction, intracranial hemorrhage, mass lesion, hydrocephalus, or extra-axial fluid. There is mild atrophy.  There is mild chronic microvascular ischemic change in the periventricular and subcortical white matter.  There are no foci of chronic hemorrhage. Negative pituitary and cerebellar tonsils.  The major intracranial vessels structures appear patent.  Post infusion, there is no abnormal enhancement of the brain or meninges.  No acute sinus or mastoid fluid.   IMPRESSION: Mild atrophy and chronic microvascular ischemic change.  The appearance is similar to April 2012.  No acute intracranial abnormalities.  No MR features suggestive of vasculitis.   MRA HEAD   Findings: The internal carotid arteries are widely patent.  Basilar artery is widely patent with codominant vertebrals.  There is no significant proximal stenosis of the anterior, middle, or posterior cerebral arteries.  Distal MCA and PCA branches widely patent.  No cerebellar branch occlusion.   IMPRESSION: Negative.   Original Report Authenticated By: Elsie Stain,  M.D.   Assessment & Plan:  See problem list. Medications and labs reviewed today.

## 2012-01-27 NOTE — Patient Instructions (Signed)
It was good to see you today. We have reviewed your interval history and records including labs and tests today [facility] Our office will contact you regarding appointment(s) once made. Medications reviewed, no changes at this time.

## 2012-01-27 NOTE — Assessment & Plan Note (Signed)
dx 10/2011 in setting of  headache and "swelling" B temples -  working with Dr Domenica Fail on same but considering change as "only get to see the PA" -  [facility] declined bx at advice of rheum 10/2011 - started high dose pred since 10/2011 with sed rate decrease from 103 to 14  (latest esr 19 and CRP 1.3 on 01/25/12) - Trial off pred early 12/2011 with rapid resumption of symptoms so resumed 40 mg/d for swelling-  now again weaning down pred since; improved "swelling" B temples but not resolved, no longer tender to touch and headache improved but persisting ache and confusion -  ?MRA and neuro eval per rheum - will hold on MRA and make neuro refer to the same tertiary center with second opinion rheum Dx 10/2011 clarified - symptoms have improved on high dose pred Encouraged to continue working with local rheum on same until then Reviewed MRI/MRA brain 10/2011 - no vasculitis findings improved ESR: started 103 to 14 on 11/16/11 at [facility] Lab Results  Component Value Date   ESRSEDRATE 103* 10/31/2011

## 2012-03-12 ENCOUNTER — Ambulatory Visit: Payer: Medicare Other | Admitting: Internal Medicine

## 2012-03-12 DIAGNOSIS — Z0289 Encounter for other administrative examinations: Secondary | ICD-10-CM

## 2012-03-20 ENCOUNTER — Other Ambulatory Visit: Payer: Self-pay | Admitting: Internal Medicine

## 2012-04-05 ENCOUNTER — Encounter: Payer: Self-pay | Admitting: Internal Medicine

## 2012-04-10 ENCOUNTER — Encounter: Payer: Self-pay | Admitting: Internal Medicine

## 2012-04-10 ENCOUNTER — Ambulatory Visit (INDEPENDENT_AMBULATORY_CARE_PROVIDER_SITE_OTHER): Payer: Medicare Other | Admitting: Internal Medicine

## 2012-04-10 VITALS — BP 152/80 | HR 80 | Temp 98.2°F | Resp 16 | Wt 156.2 lb

## 2012-04-10 DIAGNOSIS — F32A Depression, unspecified: Secondary | ICD-10-CM

## 2012-04-10 DIAGNOSIS — Z23 Encounter for immunization: Secondary | ICD-10-CM

## 2012-04-10 DIAGNOSIS — F329 Major depressive disorder, single episode, unspecified: Secondary | ICD-10-CM

## 2012-04-10 DIAGNOSIS — F411 Generalized anxiety disorder: Secondary | ICD-10-CM

## 2012-04-10 MED ORDER — SERTRALINE HCL 50 MG PO TABS: 50.0000 mg | ORAL_TABLET | Freq: Every day | ORAL | Status: DC

## 2012-04-10 NOTE — Progress Notes (Signed)
  Subjective:    Patient ID: [name] Fox, female    DOB: July 19, 1940, 71 y.o.   MRN: [id number]  HPI  complains of situational depression -  Onset 3 weeks ago with sadness and "stress" symptoms accelerated symptoms in past 24h precipitated by spouse suddenly leaving home - ?planning to not return history of same and overlapping  anxiety - often manifest as "psuedodementia" -  started on low dose SSRI for same 08/31/10 as MRI brain 10/2010 WNL. no $ errors or driving mistakes; notes intermittent but continued stress with spouse - s/p marital counseling in early 2012-  has support of friends and church- s/p personal counseling 11/2010 with [facility] ([facility])    Past Medical History  Diagnosis Date  . HERPES ZOSTER   . HEARING LOSS, BILATERAL   . OSTEOPENIA   . VITAMIN B12 DEFICIENCY   . Memory loss   . ALLERGIC RHINITIS   . ANXIETY   . DEGENERATIVE JOINT DISEASE, KNEES, BILATERAL   . DYSLIPIDEMIA   . Giant cell arteritis dx 10/2011    HA improved on pred, follows with rheum    Review of Systems  Respiratory: Negative for shortness of breath and wheezing.   Cardiovascular: Negative for chest pain.  Musculoskeletal: Negative for back pain, joint swelling and arthralgias.  Psychiatric/Behavioral: Positive for dysphoric mood and decreased concentration. Negative for hallucinations, confusion and agitation. The patient is not hyperactive.        Objective:   Physical Exam  BP 152/80  Pulse 80  Temp 98.2 F (36.8 C) (Oral)  Resp 16  Wt 156 lb 4 oz (70.875 kg)  SpO2 94% Wt Readings from Last 3 Encounters:  04/10/12 156 lb 4 oz (70.875 kg)  01/27/12 163 lb 1.9 oz (73.991 kg)  11/21/11 165 lb (74.844 kg)   Constitutional: She appears well-developed and well-nourished. No distress.   Cardiovascular: Normal rate, regular rhythm and normal heart sounds.  No murmur heard. No BLE edema. Pulmonary/Chest: Effort normal and breath sounds normal bilaterally. No respiratory distress.  She has no wheezes.  Neurologic - AAOx4, CN 2-12 symmetrically intact - normal and fluent speech, recall, balance and coordination Psychiatric: She has an appropriate depressed and sad mood and affect. Her behavior is normal. Good insight. Judgment and thought content normal.       Lab Results  Component Value Date   WBC 11.5* 10/31/2011   HGB 11.4* 10/31/2011   HCT 34.1* 10/31/2011   PLT 674.0* 10/31/2011   CHOL 176 10/31/2011   TRIG 53.0 10/31/2011   HDL 53.70 10/31/2011   LDLDIRECT 212.7 03/02/2010   ALT 38* 10/31/2011   AST 33 10/31/2011   NA 133* 10/31/2011   K 4.8 10/31/2011   CL 94* 10/31/2011   CREATININE 0.6 10/31/2011   BUN 10 10/31/2011   CO2 28 10/31/2011   TSH 1.15 10/31/2011   Lab Results  Component Value Date   VITAMINB12 220 08/31/2010   Lab Results  Component Value Date   ESRSEDRATE 103* 10/31/2011      Assessment & Plan:  See problem list. Medications and labs reviewed today.

## 2012-04-10 NOTE — Patient Instructions (Signed)
It was good to see you today. Increase sertraline to 50mg  daily now - Your prescription(s) have been submitted to your pharmacy. Please take as directed and contact our office if you believe you are having problem(s) with the medication(s). follow up with counseling - let me know if you need referral to [facility] as we discussed Please schedule followup in 6-8 weeks, call sooner if problems.

## 2012-04-10 NOTE — Assessment & Plan Note (Signed)
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

## 2012-05-22 ENCOUNTER — Ambulatory Visit (INDEPENDENT_AMBULATORY_CARE_PROVIDER_SITE_OTHER): Payer: Medicare Other | Admitting: Internal Medicine

## 2012-05-22 ENCOUNTER — Encounter: Payer: Self-pay | Admitting: Internal Medicine

## 2012-05-22 VITALS — BP 142/92 | HR 79 | Temp 97.5°F | Ht 67.0 in | Wt 146.0 lb

## 2012-05-22 DIAGNOSIS — R634 Abnormal weight loss: Secondary | ICD-10-CM

## 2012-05-22 DIAGNOSIS — M316 Other giant cell arteritis: Secondary | ICD-10-CM

## 2012-05-22 DIAGNOSIS — M25519 Pain in unspecified shoulder: Secondary | ICD-10-CM

## 2012-05-22 DIAGNOSIS — M25511 Pain in right shoulder: Secondary | ICD-10-CM

## 2012-05-22 DIAGNOSIS — F411 Generalized anxiety disorder: Secondary | ICD-10-CM

## 2012-05-22 MED ORDER — METHOCARBAMOL 500 MG PO TABS: 500.0000 mg | ORAL_TABLET | Freq: Three times a day (TID) | ORAL | Status: DC

## 2012-05-22 NOTE — Patient Instructions (Addendum)
It was good to see you today. We've reviewed your prior test results Since your mood has improved, continue same dose sertraline Will use Robaxin as muscle relaxant for your neck and shoulder pain -Your prescription(s) have been submitted to your pharmacy. Please take as directed and contact our office if you believe you are having problem(s) with the medication(s). Test(s) ordered today. Your results will be released to MyChart (or called to you) after review, usually within 72hours after test completion. If any changes need to be made, you will be notified at that same time. Please schedule followup in 3-4 months to recheck mood and weight, call sooner if problems.
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
## 2012-05-22 NOTE — Assessment & Plan Note (Signed)
dx 10/2011 in setting of  headache and "swelling" B temples - declined bx at advice of rheum 10/2011 - S/p eval and tx by Dr Anderson/rheum on same, but reluctant to return as "I only get to see the PA" -  started high dose pred 10/2011 with improvement of symptoms and sed rate decrease from 103 to 14 on 11/16/11 at [facility] (esr 19 and CRP 1.3 on 01/25/12) - Trial off pred early 12/2011 with rapid recurrence of symptoms 12/2011 resumed 40 mg/d thru 02/2012 when stopped pred again-  Currently B temples without swelling or tenderness to touch -no headache but increasing myalgia -  Encouraged to continue working with local rheum on same, esp if rising ESR Reviewed MRI/MRA brain 10/2011 - no vasculitis findings Recheck now as off pred >6 weeks with increasing myalgias Lab Results  Component Value Date   ESRSEDRATE 103* 10/31/2011

## 2012-06-01 ENCOUNTER — Telehealth: Payer: Self-pay | Admitting: *Deleted

## 2012-06-01 DIAGNOSIS — M316 Other giant cell arteritis: Secondary | ICD-10-CM

## 2012-06-01 NOTE — Telephone Encounter (Signed)
order done

## 2012-06-01 NOTE — Telephone Encounter (Signed)
Left msg on vm stating wanting md to go ahead an do the referral to see rheumalogist . Would like someone here in [place]...Raechel Chute

## 2012-06-01 NOTE — Telephone Encounter (Signed)
Pt is aware../lmb 

## 2012-06-06 ENCOUNTER — Ambulatory Visit (INDEPENDENT_AMBULATORY_CARE_PROVIDER_SITE_OTHER): Payer: Medicare Other | Admitting: Internal Medicine

## 2012-06-06 ENCOUNTER — Encounter: Payer: Self-pay | Admitting: Internal Medicine

## 2012-06-06 ENCOUNTER — Other Ambulatory Visit (INDEPENDENT_AMBULATORY_CARE_PROVIDER_SITE_OTHER): Payer: Medicare Other

## 2012-06-06 VITALS — BP 130/82 | HR 85 | Temp 97.0°F | Ht 67.0 in | Wt 147.8 lb

## 2012-06-06 DIAGNOSIS — M2550 Pain in unspecified joint: Secondary | ICD-10-CM

## 2012-06-06 LAB — SEDIMENTATION RATE: Sed Rate: 70 mm/hr — ABNORMAL HIGH (ref 0–22)

## 2012-06-06 MED ORDER — MELOXICAM 15 MG PO TABS: 15.0000 mg | ORAL_TABLET | Freq: Every day | ORAL | Status: DC

## 2012-06-06 NOTE — Patient Instructions (Signed)
It was good to see you today. Test(s) ordered today. Your results will be released to MyChart (or called to you) after review, usually within 72hours after test completion. If any changes need to be made, you will be notified at that same time. meloxicam for arthritis pains - Your prescription(s) have been submitted to your pharmacy. Please take as directed and contact our office if you believe you are having problem(s) with the medication(s). Keep follow up with Dr Kellie Simmering as scheduled 06/28/12
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
## 2012-06-07 LAB — ANA: Anti Nuclear Antibody(ANA): NEGATIVE

## 2012-06-13 ENCOUNTER — Other Ambulatory Visit: Payer: Self-pay | Admitting: *Deleted

## 2012-06-13 DIAGNOSIS — M2550 Pain in unspecified joint: Secondary | ICD-10-CM

## 2012-06-13 MED ORDER — MELOXICAM 15 MG PO TABS: 15.0000 mg | ORAL_TABLET | Freq: Every day | ORAL | Status: DC

## 2012-06-22 ENCOUNTER — Encounter: Payer: Self-pay | Admitting: Internal Medicine

## 2012-06-22 ENCOUNTER — Ambulatory Visit (INDEPENDENT_AMBULATORY_CARE_PROVIDER_SITE_OTHER): Payer: Medicare Other | Admitting: Internal Medicine

## 2012-06-22 VITALS — BP 130/72 | HR 91 | Temp 97.9°F | Ht 67.0 in | Wt 146.4 lb

## 2012-06-22 DIAGNOSIS — J471 Bronchiectasis with (acute) exacerbation: Secondary | ICD-10-CM

## 2012-06-22 DIAGNOSIS — M316 Other giant cell arteritis: Secondary | ICD-10-CM

## 2012-06-22 MED ORDER — LEVOFLOXACIN 750 MG PO TABS: 750.0000 mg | ORAL_TABLET | Freq: Every day | ORAL | Status: AC

## 2012-06-22 MED ORDER — HYDROCODONE-HOMATROPINE 5-1.5 MG/5ML PO SYRP: 5.0000 mL | ORAL_SOLUTION | Freq: Four times a day (QID) | ORAL | Status: DC | PRN

## 2012-06-22 NOTE — Assessment & Plan Note (Signed)
dx 10/2011 in setting of  headache and "swelling" B temples - declined bx at advice of rheum 10/2011 Dareen Piano started high dose pred 10/2011 with improvement of symptoms and sed rate decrease from 103 to 14 on 11/16/11 at [facility] (esr 19 and CRP 1.3 on 01/25/12) - Trial off pred early 12/2011 with rapid recurrence of symptoms resumed pred 40 mg/d 12/2011 thru 02/2012 when stopped pred again-  Currently B temples without swelling or tenderness to touch -no headache but increasing myalgia -  Encouraged to continue working with local rheum on same because of rising ESR - now with [facility] Claris P[facility]) since 05/2012 and back on pred 20 qd Reviewed MRI/MRA brain 10/2011 - no vasculitis findings   Lab Results  Component Value Date   ESRSEDRATE 103* 10/31/2011   Lab Results  Component Value Date   ESRSEDRATE 70* 06/06/2012

## 2012-06-22 NOTE — Patient Instructions (Addendum)
It was good to see you today. Levaquin antibiotics and prescription cough syrup - Your prescription(s) have been submitted to your pharmacy. Please take as directed and contact our office if you believe you are having problem(s) with the medication(s). Other meds reviewed and updated use tylenol for aches, pain and fever symptoms as discussed Hydrate, rest and call if worse or unimproved

## 2012-06-22 NOTE — Progress Notes (Signed)
  Subjective:    HPI  complains of chest cold symptoms  Onset 24-48h ago, progressively worse symptoms  associated with rhinorrhea, sore throat, mild headache and low grade fever Also myalgias, sinus pressure and mild-mod chest congestion No relief with OTC meds Precipitated by sick contacts  Past Medical History  Diagnosis Date  . HERPES ZOSTER   . HEARING LOSS, BILATERAL   . OSTEOPENIA   . VITAMIN B12 DEFICIENCY   . Memory loss   . ALLERGIC RHINITIS   . ANXIETY   . DEGENERATIVE JOINT DISEASE, KNEES, BILATERAL   . DYSLIPIDEMIA   . Giant cell arteritis dx 10/2011    HA improved on pred, follows with rheum    Review of Systems Constitutional: No night sweats, no unexpected weight change Pulmonary: No pleurisy or hemoptysis Cardiovascular: No chest pain or palpitations     Objective:   Physical Exam BP 130/72  Pulse 91  Temp 97.9 F (36.6 C) (Oral)  Ht 5\' 7"  (1.702 m)  Wt 146 lb 6.4 oz (66.407 kg)  BMI 22.93 kg/m2  SpO2 97% GEN: mildly ill appearing and audible head/chest congestion HENT: NCAT, no sinus tenderness bilaterally, nares with clear discharge, oropharynx mild erythema, no exudate Eyes: Vision grossly intact, no conjunctivitis Lungs: scattered rhonchi but no wheeze, no increased work of breathing Cardiovascular: Regular rate and rhythm, no bilateral edema  Lab Results  Component Value Date   WBC 11.5* 10/31/2011   HGB 11.4* 10/31/2011   HCT 34.1* 10/31/2011   PLT 674.0* 10/31/2011   GLUCOSE 158* 10/31/2011   CHOL 176 10/31/2011   TRIG 53.0 10/31/2011   HDL 53.70 10/31/2011   LDLDIRECT 212.7 03/02/2010   LDLCALC 112* 10/31/2011   ALT 38* 10/31/2011   AST 33 10/31/2011   NA 133* 10/31/2011   K 4.8 10/31/2011   CL 94* 10/31/2011   CREATININE 0.6 10/31/2011   BUN 10 10/31/2011   CO2 28 10/31/2011   TSH 1.15 10/31/2011      Assessment & Plan:   bronchiectasis with acute exacerbation Cough, postnasal drip related to above Suspect viral URI  trigger    Empiric antibiotics prescribed due to symptom duration greater than 7 days and coexisting pulm dz (bronchectasis)  Prescription cough suppression - new prescriptions done Symptomatic care with Tylenol, hydration and rest -  salt gargle advised as needed

## 2012-06-28 ENCOUNTER — Telehealth: Payer: Self-pay | Admitting: *Deleted

## 2012-06-28 NOTE — Telephone Encounter (Signed)
Pt states that Levaquin given to her at last OV caused severe abdominal pain and some mental confusion, so she has stopped taking it. Pt wanted MD to be aware.

## 2012-06-28 NOTE — Telephone Encounter (Signed)
[name] Fox, Reaction noted. If symptoms persist or get worse off antibiotic, we can switch to a different one. Thx! Rene Kocher

## 2012-07-09 ENCOUNTER — Ambulatory Visit (INDEPENDENT_AMBULATORY_CARE_PROVIDER_SITE_OTHER): Payer: Medicare Other | Admitting: Internal Medicine

## 2012-07-09 ENCOUNTER — Encounter: Payer: Self-pay | Admitting: Internal Medicine

## 2012-07-09 VITALS — BP 130/92 | HR 70 | Temp 97.9°F | Ht 67.0 in | Wt 148.0 lb

## 2012-07-09 DIAGNOSIS — R413 Other amnesia: Secondary | ICD-10-CM

## 2012-07-09 DIAGNOSIS — M316 Other giant cell arteritis: Secondary | ICD-10-CM

## 2012-07-09 MED ORDER — DONEPEZIL HCL 5 MG PO TABS: 5.0000 mg | ORAL_TABLET | Freq: Every day | ORAL | Status: DC

## 2012-07-09 NOTE — Assessment & Plan Note (Signed)
dx 10/2011 in setting of  headache and "swelling" B temples - declined bx at advice of rheum 10/2011 Dareen Piano started high dose pred 10/2011 with improvement of symptoms and sed rate decrease from 103 to 14 on 11/16/11 at [facility] (esr 19 and CRP 1.3 on 01/25/12) - Trial off pred early 12/2011 with rapid recurrence of symptoms resumed pred 40 mg/d 12/2011 thru 02/2012 when stopped pred again-  Currently B temples without swelling or tenderness to touch -no headache but increasing myalgia -  S/p 2nd local rheum eval on same because of rising ESR - now with [facility] Clar[facility]) since 05/2012 and back on pred 20 qd 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

## 2012-07-09 NOTE — Patient Instructions (Addendum)
It was good to see you today. We have reviewed your prior records including labs and tests today Medications reviewed and updated - will start low dose aricept pending follow up with Dr Terrace Arabia, no other changes at this time. Your prescription(s) have been submitted to your pharmacy. Please take as directed and contact our office if you believe you are having problem(s) with the medication(s). we'll make referral back Dr Terrace Arabia as discussed. Our office will contact you regarding appointment(s) once made. Please schedule followup in 3-4 months for review, call sooner if problems.

## 2012-07-09 NOTE — Assessment & Plan Note (Addendum)
Labs reviewed and MRI brain 10/2011 Increase symptoms - ?related to dementia, pred or other underlying rheum problem S/p eval 02/2012 by neuro - felt consistent with mild dementia, but never kept follow up for consideration of med initiation Refer back to neuro now and start aricept 5g qhs Also continue SSRI for "pseudodementia symptoms" as symptoms improved initially on tx for same

## 2012-07-09 NOTE — Progress Notes (Signed)
  Subjective:    Patient ID: [name] Fox, female    DOB: 10-Dec-1940, 71 y.o.   MRN: [id number]  HPI  Here for medical review - several concerns  Reviewed 2nd rheum opinion (local) - feels GCA actually representative of PMR - on pred 20 qd at present [facility] appt for rheum pending 07/2012 for outside opinion/clarification  Also ?start med for memory and asst with neuo follow up as recommended    Past Medical History  Diagnosis Date  . HERPES ZOSTER   . HEARING LOSS, BILATERAL   . OSTEOPENIA   . VITAMIN B12 DEFICIENCY   . Memory loss   . ALLERGIC RHINITIS   . ANXIETY   . DEGENERATIVE JOINT DISEASE, KNEES, BILATERAL   . DYSLIPIDEMIA   . Giant cell arteritis dx 10/2011    HA improved on pred, follows with rheum    Review of Systems  Respiratory: Negative for shortness of breath and wheezing.   Cardiovascular: Negative for chest pain.  Musculoskeletal: Positive for arthralgias. Negative for myalgias, back pain and joint swelling.  Neurological: Negative for dizziness, weakness and headaches.  Psychiatric/Behavioral: Negative for self-injury. The patient is not nervous/anxious.        Objective:   Physical Exam  BP 130/92  Pulse 70  Temp 97.9 F (36.6 C) (Oral)  Ht 5\' 7"  (1.702 m)  Wt 148 lb (67.132 kg)  BMI 23.18 kg/m2  SpO2 97% Wt Readings from Last 3 Encounters:  07/09/12 148 lb (67.132 kg)  06/22/12 146 lb 6.4 oz (66.407 kg)  06/06/12 147 lb 12.8 oz (67.042 kg)   Constitutional: She appears well-developed and well-nourished. No distress.  spouse at side Cardiovascular: Normal rate, regular rhythm and normal heart sounds.  No murmur heard. No BLE edema. Pulmonary/Chest: Effort normal and breath sounds normal bilaterally. No respiratory distress. She has no wheezes.  MSkel: FROM with good strength, no synovitis or effusions. Psychiatric: She has an brighter mood and affect. Her behavior is normal. Good insight. Judgment and thought content normal.       Lab Results   Component Value Date   WBC 11.5* 10/31/2011   HGB 11.4* 10/31/2011   HCT 34.1* 10/31/2011   PLT 674.0* 10/31/2011   CHOL 176 10/31/2011   TRIG 53.0 10/31/2011   HDL 53.70 10/31/2011   LDLDIRECT 212.7 03/02/2010   ALT 38* 10/31/2011   AST 33 10/31/2011   NA 133* 10/31/2011   K 4.8 10/31/2011   CL 94* 10/31/2011   CREATININE 0.6 10/31/2011   BUN 10 10/31/2011   CO2 28 10/31/2011   TSH 1.15 10/31/2011   Lab Results  Component Value Date   VITAMINB12 220 08/31/2010   Lab Results  Component Value Date   ESRSEDRATE 70* 06/06/2012   Lab Results  Component Value Date   ANA NEG 06/06/2012    Assessment & Plan:  See problem list. Medications and labs reviewed today.  Arthralgias - no synovitis, effusions or deformity on exam - Suspect osteoarthritis with weather change precipitated flare However, suspected overlap with rheumatologic dz (? PMR or GCA) given increase ESR, improved on pred Pending eval 3rd opinion rheum at [facility] 07/2012

## 2012-07-28 ENCOUNTER — Ambulatory Visit (INDEPENDENT_AMBULATORY_CARE_PROVIDER_SITE_OTHER): Payer: Medicare Other | Admitting: Internal Medicine

## 2012-07-28 ENCOUNTER — Encounter: Payer: Self-pay | Admitting: Internal Medicine

## 2012-07-28 VITALS — BP 158/90 | HR 76 | Temp 97.4°F | Wt 144.0 lb

## 2012-07-28 DIAGNOSIS — R059 Cough, unspecified: Secondary | ICD-10-CM

## 2012-07-28 DIAGNOSIS — R05 Cough: Secondary | ICD-10-CM

## 2012-07-28 MED ORDER — MOXIFLOXACIN HCL 400 MG PO TABS: 400.0000 mg | ORAL_TABLET | Freq: Every day | ORAL | Status: DC

## 2012-07-28 NOTE — Progress Notes (Signed)
Subjective:    Patient ID: [name] Fox, female    DOB: 1941/05/06, 72 y.o.   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 Delton Fox for bronchiectasis, [name] Fox for rheumatology for her temporal arteritis. She presents with a 4 day history of upper respiratory symptoms with 2 day history of very thick dark mucus, no SOB, mild wheezing. She had a bronchodilator but hasn't needed it for over a year. She denies any fever, N/V/D  Past Medical History  Diagnosis Date  . HERPES ZOSTER   . HEARING LOSS, BILATERAL   . OSTEOPENIA   . VITAMIN B12 DEFICIENCY   . Memory loss   . ALLERGIC RHINITIS   . ANXIETY   . DEGENERATIVE JOINT DISEASE, KNEES, BILATERAL   . DYSLIPIDEMIA   . Giant cell arteritis dx 10/2011    HA improved on pred, follows with rheum  . Dementia 08/31/2010    Qualifier: Diagnosis of  By: [name] Coyer MD, [name] Fox    Past Surgical History  Procedure Date  . Bilateral mastectomy     while removing cosmetic silicone implants (NO Breast cancer)  . Bilateral silicone breast explant 1993    Due to rupture/leak @ [facility]   Family History  Problem Relation Age of Onset  . Pancreatic cancer Father    History   Social History  . Marital Status: Married    Spouse Name: N/A    Number of Children: N/A  . Years of Education: N/A   Occupational History  . Not on file.   Social History Main Topics  . Smoking status: Never Smoker   . Smokeless tobacco: Never Used     Comment: Married lives with spouse. adopted son nearby. Retired from public health-nutritionist  . Alcohol Use: 1.8 oz/week    3 Glasses of wine per week     Comment: Occassional white wine  . Drug Use: No  . Sexually Active: Not on file   Other Topics Concern  . Not on file   Social History Narrative  . No narrative on file    Current Outpatient Prescriptions on File Prior to Visit  Medication Sig Dispense Refill  . albuterol (PROAIR HFA) 108 (90  BASE) MCG/ACT inhaler Inhale 2 puffs into the lungs every 4 (four) hours as needed for wheezing or shortness of breath.  8.5 g  1  . alendronate (FOSAMAX) 70 MG tablet Take 1 by mouth weekly      . aspirin 81 MG tablet Take 81 mg by mouth daily.        Marland Kitchen donepezil (ARICEPT) 5 MG tablet Take 1 tablet (5 mg total) by mouth at bedtime.  30 tablet  3  . loratadine (CLARITIN) 10 MG tablet Take 10 mg by mouth daily as needed.        . meloxicam (MOBIC) 15 MG tablet Take 1 tablet (15 mg total) by mouth daily.  30 tablet  2  . methocarbamol (ROBAXIN) 500 MG tablet Take 1 tablet (500 mg total) by mouth 3 (three) times daily. For neck and shoulder pain as needed  30 tablet  1  . Omega-3 Fatty Acids (FISH OIL) 1000 MG CAPS Take 1,000 mg by mouth daily.        . pravastatin (PRAVACHOL) 20 MG tablet TAKE 1 TABLET BY MOUTH DAILY.  30 tablet  5  . predniSONE (DELTASONE) 20 MG tablet Take 1 tablet (20 mg total) by mouth daily.  Or as directed by rheumatology      . sertraline (ZOLOFT) 50 MG tablet Take 1 tablet (50 mg total) by mouth daily.  30 tablet  5  . vitamin B-12 (CYANOCOBALAMIN) 100 MCG tablet Take 100 mcg by mouth daily.      Marland Kitchen dextromethorphan-guaiFENesin (MUCINEX DM) 30-600 MG per 12 hr tablet Take 1 tablet by mouth every 12 (twelve) hours.            Review of Systems System review is negative for any constitutional, cardiac, pulmonary, GI or neuro symptoms or complaints other than as described in the HPI.    Objective:   Physical Exam Filed Vitals:   07/28/12 1129  BP: 158/90  Pulse: 76  Temp: 97.4 F (36.3 C)  O2 sat - 93%  Gen'l - WNWD older woman in no distress HEENT- mild sinus tenderness to percussion Neck- supple Cor- RRR Pulm - good breath sounds throughout, no rales or wheezing.        Assessment & Plan:  Cough - congestion and mucus production in an immunocompromised patient (steroid use) with a h/o bronchiectasis. Prudent to treat with antibiotics, and the patient is  comfortable with this.  Plan Avelox 400 mg once a day for 7 days  Cough syrup of choice  Mucinex 1200 mg twice a day  Hydrate and usual supportive care.   Call for any worsening of cough or respiratory distress.

## 2012-07-28 NOTE — Patient Instructions (Addendum)
Cough - congestion and mucus production in an immunocompromised patient (steroid use) with a h/o bronchiectasis. Prudent to treat with antibiotics, and the patient is comfortable with this.  Plan Avelox 400 mg once a day for 7 days  Cough syrup of choice  Mucinex 1200 mg twice a day  Hydrate and usual supportive care.   Call for any worsening of cough or respiratory distress.

## 2012-08-03 ENCOUNTER — Encounter: Payer: Self-pay | Admitting: Internal Medicine

## 2012-08-03 ENCOUNTER — Ambulatory Visit (INDEPENDENT_AMBULATORY_CARE_PROVIDER_SITE_OTHER)
Admission: RE | Admit: 2012-08-03 | Discharge: 2012-08-03 | Disposition: A | Discharge status: Home / Self Care | Payer: Medicare Other | Source: Ambulatory Visit | Attending: Internal Medicine | Admitting: Internal Medicine

## 2012-08-03 ENCOUNTER — Ambulatory Visit (INDEPENDENT_AMBULATORY_CARE_PROVIDER_SITE_OTHER): Payer: Medicare Other | Admitting: Internal Medicine

## 2012-08-03 VITALS — BP 120/72 | HR 80 | Temp 98.4°F | Resp 10 | Wt 145.1 lb

## 2012-08-03 DIAGNOSIS — R05 Cough: Secondary | ICD-10-CM

## 2012-08-03 DIAGNOSIS — R058 Other specified cough: Secondary | ICD-10-CM

## 2012-08-03 DIAGNOSIS — R059 Cough, unspecified: Secondary | ICD-10-CM

## 2012-08-03 DIAGNOSIS — J479 Bronchiectasis, uncomplicated: Secondary | ICD-10-CM

## 2012-08-03 MED ORDER — ALBUTEROL SULFATE HFA 108 (90 BASE) MCG/ACT IN AERS: 2.0000 | INHALATION_SPRAY | RESPIRATORY_TRACT | Status: DC | PRN

## 2012-08-03 NOTE — Patient Instructions (Signed)
It was good to see you today. Test(s) ordered today. Your results will be released to MyChart (or called to you) after review, usually within 72hours after test completion. If any changes need to be made, you will be notified at that same time. If you develop worsening symptoms or fever, call and we can reconsider additional antibiotics, but it does not appear necessary to use more antibiotics at this time. Continue Mucinex and syrup as directed Refill on Albuterol - use as direceted

## 2012-08-03 NOTE — Progress Notes (Signed)
Subjective:    Patient ID: [name] Fox, female    DOB: Apr 06, 1941, 72 y.o.   MRN: [id number]  Cough  reviewed OV with [name] Fox, a 72 y/o white woman is followed by Dr. Felicity Coyer for IM, sees Dr. Delton Coombes for bronchiectasis, Dr.Truslow for rheumatology for her temporal arteritis. She presents with a 4 day history of upper respiratory symptoms with 2 day history of very thick dark mucus, no SOB, mild wheezing. She had a bronchodilator but hasn't needed it for over a year. She denies any fever, N/V/D  Treated with Avelox x 7days completed - sputum now without color, but thick Continued chest congestion  Past Medical History  Diagnosis Date  . HERPES ZOSTER   . HEARING LOSS, BILATERAL   . OSTEOPENIA   . VITAMIN B12 DEFICIENCY   . Memory loss   . ALLERGIC RHINITIS   . ANXIETY   . DEGENERATIVE JOINT DISEASE, KNEES, BILATERAL   . DYSLIPIDEMIA   . Giant cell arteritis dx 10/2011    HA improved on pred, follows with rheum  . Dementia 08/31/2010    Qualifier: Diagnosis of  By: Felicity Coyer MD, Raenette Rover     Review of Systems  Respiratory: Positive for cough.        Objective:   Physical Exam BP 120/72  Pulse 80  Temp 98.4 F (36.9 C) (Oral)  Resp 10  Wt 145 lb 1.3 oz (65.808 kg)  SpO2 97% Wt Readings from Last 3 Encounters:  08/03/12 145 lb 1.3 oz (65.808 kg)  07/28/12 144 lb (65.318 kg)  07/09/12 148 lb (67.132 kg)   Constitutional: She appears well-developed and well-nourished. No distress.  HENT: Head: Normocephalic and atraumatic. Ears: B TMs ok, no erythema or effusion; Nose: Nose normal.  Mouth/Throat: Oropharynx is clear and moist. No oropharyngeal exudate.  Eyes: Conjunctivae and EOM are normal. Pupils are equal, round, and reactive to light. No scleral icterus.  Neck: Normal range of motion. Neck supple. No JVD present. No thyromegaly present.  Cardiovascular: Normal rate, regular rhythm and normal heart sounds.  No murmur heard. No BLE  edema. Pulmonary/Chest: Effort normal and breath sounds normal. No respiratory distress. She has no wheezes.  Abdominal: Soft. Bowel sounds are normal. She exhibits no distension. There is no tenderness. no masses Musculoskeletal: Normal range of motion, no joint effusions. No gross deformities Neurological: She is alert and oriented to person, place, and time. No cranial nerve deficit. Coordination normal.  Skin: Skin is warm and dry. No rash noted. No erythema.  Psychiatric: She has a normal mood and affect. Her behavior is normal. Judgment and thought content normal.   Lab Results  Component Value Date   WBC 11.5* 10/31/2011   HGB 11.4* 10/31/2011   HCT 34.1* 10/31/2011   PLT 674.0* 10/31/2011   GLUCOSE 158* 10/31/2011   CHOL 176 10/31/2011   TRIG 53.0 10/31/2011   HDL 53.70 10/31/2011   LDLDIRECT 212.7 03/02/2010   LDLCALC 112* 10/31/2011   ALT 38* 10/31/2011   AST 33 10/31/2011   NA 133* 10/31/2011   K 4.8 10/31/2011   CL 94* 10/31/2011   CREATININE 0.6 10/31/2011   BUN 10 10/31/2011   CO2 28 10/31/2011   TSH 1.15 10/31/2011      Assessment & Plan:  Persisting cough -  Recent acute exacerbation of bronchiectasis s/p 7 days Avelox immunocompromised patient (steroid use)    Plan check CXR now  Plan to hold additional antibiotics as febrile and no coloration to  sputum unless abnormal CXR  Refill Alb MDI  Continue cough syrup and Mucinex 1200 mg twice a day  Hydrate and usual supportive care.   Call for any fever, worsening of cough or respiratory distress.

## 2012-08-22 ENCOUNTER — Other Ambulatory Visit: Payer: Self-pay | Admitting: *Deleted

## 2012-08-22 MED ORDER — PRAVASTATIN SODIUM 20 MG PO TABS: 20.0000 mg | ORAL_TABLET | Freq: Every day | ORAL | Status: DC

## 2012-09-17 ENCOUNTER — Other Ambulatory Visit: Payer: Self-pay | Admitting: *Deleted

## 2012-09-17 MED ORDER — SERTRALINE HCL 50 MG PO TABS: 50.0000 mg | ORAL_TABLET | Freq: Every day | ORAL | Status: DC

## 2012-10-17 ENCOUNTER — Telehealth: Payer: Self-pay | Admitting: *Deleted

## 2012-10-17 MED ORDER — DONEPEZIL HCL 5 MG PO TABS: 5.0000 mg | ORAL_TABLET | Freq: Every day | ORAL | Status: DC

## 2012-10-17 NOTE — Telephone Encounter (Signed)
Rx request to pharmacy/[facility]  

## 2012-11-02 ENCOUNTER — Ambulatory Visit (INDEPENDENT_AMBULATORY_CARE_PROVIDER_SITE_OTHER): Payer: Medicare Other | Admitting: Neurology

## 2012-11-02 ENCOUNTER — Encounter: Payer: Self-pay | Admitting: Neurology

## 2012-11-02 VITALS — BP 120/80 | Ht 66.0 in | Wt 148.0 lb

## 2012-11-02 DIAGNOSIS — E538 Deficiency of other specified B group vitamins: Secondary | ICD-10-CM

## 2012-11-02 DIAGNOSIS — R413 Other amnesia: Secondary | ICD-10-CM

## 2012-11-02 DIAGNOSIS — M316 Other giant cell arteritis: Secondary | ICD-10-CM

## 2012-11-02 NOTE — Progress Notes (Signed)
HPI: [name] Fox is a 72 years old right-handed Caucasian female, accompanied by her husband, referred by rheumatologist Dr. Dareen Piano, and her primary care physician Dr. Felicity Coyer evaluation of short-term memory trouble.  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, raising her son, moved along with her husband, who is a Company secretary,  She was diagnosed with giant cell arteritis in April 2013, based on elevated ESR 103, no temporal artery biopsy was performed, she presented with headaches, temporal artery palpable swelling, diffuse fatigue, she responded very well with prednisone, initially 60 mg a day, now only 5 mg a day, most recent repeat ESR January 25 2012 was 19, C-reactive protein was 13,  Her husband noticed she has mild memory trouble for more than one year, she tends to repeat herself, has to be reminded about her medication, doctor's appointment, taking care of her house chore without difficulty, gardening regularly. She quit driving for month ago, because noticed worsening short-term memory trouble, slow reaction time with her most recent diagnosis of arthritis  Her older brothers at age 69, 66, suffered mild memory loss, her father died at age 58 of pancreatic cancer, mother died at age 58 of infection  Recent laboratory showed LDL 112, previous cholesterol 290, normal TSH 1.15, mild elevated alkaline phosphatase at 156, low sodium 133, glucose 158  MRI of the brain showed mild atrophy, no significant change compared to previous study in 2012, MRA of the brain was normal,  She complains of excessive snoring, fatigue, moderate sleepiness, ESS score is 9, FSS is 50, she also complains of early morning headaches, denied previous history of headaches, no visual change    She was found to have elevated ESR 70, B12 220, she was referred to rheumatologist, diagnosed with polymyalgia rheumatica, she was put on  higher dose of prednisone, 20 mg every day Jan 2014, now tapering dose to 10 mg every day, which has helped her diffuse body aching pain, she continued to be frustrated by her short-term memory trouble, she was started on Zoloft, and Aricept, without a significant improvement, she has quit driving for 2 months, is not exercising regularly  UPDATE April 18th 2014: She had neuropsychiatric evaluation by Dr. Jacquelyne Balint, most consistent with diagnosis of mild cognitive impairment, there is significant for concern of not inserted and neurodegenerative condition such as Alzheimer's disease, particularly giving evidence of medial temporal, and the hippocampal dysfunction, Mini-Mental Status was 25 out of 30, markedly impaired unstructured verbal free recall, and recognition, Delayed contextual free recall, reduced nonverbal learning and memory, there was no significant signs of depression or  Anxiety.   Review of Systems  Out of a complete 14 system review, the patient complains of only the following symptoms, and all other reviewed systems are negative.  Constitutional: Weight loss   Respiratory: Snoring   Hematology/Lymphatic: Easy bruising   Neurological: Memory loss   Confusion   Sleep: Snoring   ENT: Hearing loss   Musculoskeletal: Joint pain   Joint swelling       Physical Exam  Neck: supple no carotid bruits Respiratory: clear to auscultation bilaterally Cardiovascular: regular rate rhythm  Neurologic Exam  Mental Status: pleasant, awake, alert, cooperative to history, talking, and casual conversation. MMSE 26/30, she is not oriented to date,  missed 3/3 recalls Cranial Nerves: CN II-XII pupils were equal round reactive to light.  Fundi were sharp bilaterally.  Extraocular movements were full.  Visual fields were full on confrontational test.  Facial sensation and strength were normal.  Hearing was intact to finger rubbing bilaterally.  Uvula tongue were midline.  Head turning and shoulder  shrugging were normal and symmetric.  Tongue protrusion into the cheeks strength were normal.  Motor: Normal tone, bulk, and strength. Sensory: Normal to light touch, pinprick, proprioception, and vibratory sensation. Coordination: Normal finger-to-nose, heel-to-shin.  There was no dysmetria noticed. Gait and Station: Narrow based and steady, was able to perform tiptoe, heel, and tandem walking without difficulty.  Romberg sign: Negative Reflexes: Deep tendon reflexes: Biceps: 2/2, Brachioradialis: 2/2, Triceps: 2/2, Pateller: 2/2, Achilles: 2/2.  Plantar responses are flexor.   Assessment and Plan: 72 years old Caucasian female, with more than 1 year history of gradual onset short-term memory trouble, her 2 elderly brothers have mild memory trouble as well, MMSE 26/30.  1. 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 2.  vit B12 po supplement. 3. LZAX trial information was provided. 4. RTC in 3 months.

## 2012-11-05 ENCOUNTER — Telehealth: Payer: Self-pay | Admitting: *Deleted

## 2012-11-05 ENCOUNTER — Encounter: Payer: Self-pay | Admitting: *Deleted

## 2012-11-05 NOTE — Telephone Encounter (Signed)
Her order was to come back in x3 months. I did not see an opening. Patient does not want to wait until first available

## 2012-11-05 NOTE — Telephone Encounter (Signed)
Message copied by Monico Blitz on Mon Nov 05, 2012 11:42 AM ------      Message from: Richrd Prime      Created: Fri Nov 02, 2012  3:56 PM      Contact: patient was seen today        Her order was to come back in x3 months.  I did not see an opening.  Patient does not want to wait until first available.              [facility] ------

## 2012-11-06 ENCOUNTER — Encounter: Payer: Self-pay | Admitting: Internal Medicine

## 2012-11-06 ENCOUNTER — Ambulatory Visit (INDEPENDENT_AMBULATORY_CARE_PROVIDER_SITE_OTHER): Payer: Medicare Other | Admitting: Internal Medicine

## 2012-11-06 ENCOUNTER — Telehealth: Payer: Self-pay | Admitting: Internal Medicine

## 2012-11-06 ENCOUNTER — Telehealth: Payer: Self-pay | Admitting: *Deleted

## 2012-11-06 VITALS — BP 142/98 | HR 67 | Temp 97.3°F | Wt 151.0 lb

## 2012-11-06 DIAGNOSIS — N644 Mastodynia: Secondary | ICD-10-CM

## 2012-11-06 DIAGNOSIS — F0390 Unspecified dementia without behavioral disturbance: Secondary | ICD-10-CM

## 2012-11-06 DIAGNOSIS — F32A Depression, unspecified: Secondary | ICD-10-CM | POA: Insufficient documentation

## 2012-11-06 DIAGNOSIS — F329 Major depressive disorder, single episode, unspecified: Secondary | ICD-10-CM

## 2012-11-06 DIAGNOSIS — M353 Polymyalgia rheumatica: Secondary | ICD-10-CM

## 2012-11-06 MED ORDER — DONEPEZIL HCL 10 MG PO TABS: 10.0000 mg | ORAL_TABLET | Freq: Every day | ORAL | Status: DC

## 2012-11-06 MED ORDER — SERTRALINE HCL 100 MG PO TABS: 100.0000 mg | ORAL_TABLET | Freq: Every day | ORAL | Status: DC

## 2012-11-06 NOTE — Telephone Encounter (Signed)
Called breast center spoke with Felia gave regina response. They can change order but will need md signature as well. Will fax over order for md to sign...lmb

## 2012-11-06 NOTE — Telephone Encounter (Signed)
Nlelia [phone] called from [facility] center stating that pt has an order for left breast ultrasound, but this order need to be change to Bilateral ultrasound due to the amount of time that pt has not had an examination. Please advise.

## 2012-11-06 NOTE — Progress Notes (Signed)
  Subjective:    Patient ID: [name] Fox, female    DOB: 09-19-1940, 72 y.o.   MRN: [id number]  HPI  Here for medical review - several concerns  Reviewed 2nd rheum opinion (local) - fall 2013 felt GCA actually representative of PMR, tapering off pred, on 5mg /d at present - did not seek opinion at [facility] as planned  Also reviewed memory eval with neuro > early cognitive impairment  Reports increasing depression symptoms because of same    Past Medical History  Diagnosis Date  . HERPES ZOSTER   . HEARING LOSS, BILATERAL   . OSTEOPENIA   . VITAMIN B12 DEFICIENCY   . Mild cognitive impairment with memory loss     neuropscy eval 10/2012 and follows with neuro  . ALLERGIC RHINITIS   . ANXIETY   . DEGENERATIVE JOINT DISEASE, KNEES, BILATERAL   . DYSLIPIDEMIA   . PMR (polymyalgia rheumatica) dx 10/2011    Initially dx GCA, then PMR clarified -on pred, follows with rheum  . Dementia 08/31/2010    Qualifier: Diagnosis of  By: Felicity Coyer MD, Raenette Rover     Review of Systems  Respiratory: Negative for shortness of breath and wheezing.   Cardiovascular: Negative for chest pain.  Musculoskeletal: Negative for myalgias, back pain, joint swelling and arthralgias.  Neurological: Negative for dizziness, weakness and headaches.  Psychiatric/Behavioral: Positive for dysphoric mood and decreased concentration. Negative for suicidal ideas, confusion, sleep disturbance and self-injury. The patient is not nervous/anxious.        Objective:   Physical Exam  BP 142/98  Pulse 67  Temp(Src) 97.3 F (36.3 C) (Oral)  Wt 151 lb (68.493 kg)  BMI 24.38 kg/m2  SpO2 93% Wt Readings from Last 3 Encounters:  11/06/12 151 lb (68.493 kg)  11/02/12 148 lb (67.132 kg)  08/03/12 145 lb 1.3 oz (65.808 kg)   Constitutional: She appears well-developed and well-nourished. No distress.   Cardiovascular: Normal rate, regular rhythm and normal heart sounds.  No murmur heard. No BLE edema. Pulmonary/Chest: Effort  normal and breath sounds normal bilaterally. No respiratory distress. She has no wheezes.  MSkel: FROM with good strength, no synovitis or effusions. Psychiatric: She has a depressed mood and affect. Her behavior is normal. Good insight. Judgment and thought content normal.       Lab Results  Component Value Date   WBC 11.5* 10/31/2011   HGB 11.4* 10/31/2011   HCT 34.1* 10/31/2011   PLT 674.0* 10/31/2011   CHOL 176 10/31/2011   TRIG 53.0 10/31/2011   HDL 53.70 10/31/2011   LDLDIRECT 212.7 03/02/2010   ALT 38* 10/31/2011   AST 33 10/31/2011   NA 133* 10/31/2011   K 4.8 10/31/2011   CL 94* 10/31/2011   CREATININE 0.6 10/31/2011   BUN 10 10/31/2011   CO2 28 10/31/2011   TSH 1.15 10/31/2011   Lab Results  Component Value Date   VITAMINB12 220 08/31/2010   Lab Results  Component Value Date   ESRSEDRATE 70* 06/06/2012   Lab Results  Component Value Date   ANA NEG 06/06/2012    Assessment & Plan:  See problem list. Medications and labs reviewed today.  L breast pain - located over 230 position above scar, associated with nodule and cystic feeling texture - but given new pain in past 30 days and complicated breat hx with numerous surgeries, will refer for ultrasound

## 2012-11-06 NOTE — Assessment & Plan Note (Signed)
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 in setting of  headache and "swelling" B temples - declined bx at advice of rheum 10/2011 [facility] started high dose pred 10/2011 with improvement of symptoms and sed rate decrease from 103 to 14 on 11/16/11 at [facility] (esr 19 and CRP 1.3 on 01/25/12) - Trial off pred early 12/2011 with rapid recurrence of symptoms resumed pred 40 mg/d 12/2011 thru 02/2012 when stopped pred again-  S/p 2nd local rheum eval on same because of rising ESR - now with Truslow (prev [facility]) since 05/2012  Feels dx more consistent with PMR than GCA Reviewed MRI/MRA brain 10/2011 - no vasculitis findings Tapering off pred, down to 5g/d symptoms controlled at present - no [facility] eval planned The current medical regimen is effective;  continue present plan and medications.   Lab Results  Component Value Date   ESRSEDRATE 103* 10/31/2011   Lab Results  Component Value Date   ESRSEDRATE 70* 06/06/2012   

## 2012-11-06 NOTE — Telephone Encounter (Signed)
MD out of office. Pls advise on changing order...lmb

## 2012-11-06 NOTE — Telephone Encounter (Signed)
Ok to change to bilateral ultrasound

## 2012-11-06 NOTE — Assessment & Plan Note (Signed)
Overlap with memory dysfunction - Also exacerbated by marital stress - intermittently considering separation -  spouse left 03/2012, but has since returned On low dose SSRI for same since 08/2010 - increased dose 04/10/12 - Increasing symptoms with formal dx "early dementia" -maximize dose now s/p personal behav health counseling with [facility] ([facility]) and s/p [facility]ling eval x 2 Also neuro and neuropsyc spring 2014 - currently working with support from [employer]

## 2012-11-06 NOTE — Assessment & Plan Note (Signed)
Labs reviewed and MRI brain 10/2011 Increase symptoms - ?related to dementia, pred or other underlying rheum problem S/p eval 02/2012 by neuro - felt consistent with mild dementia, confirmed on spring 2014 neuropsyc eval>mild cognitive dysfunction started aricept 5mg  qhs 06/2012, titrate up now Also continue SSRI for "pseudodementia symptoms" as symptoms improved initially on tx for same - see above

## 2012-11-06 NOTE — Telephone Encounter (Signed)
Pt reports she does not do any mammo because of prior surgeries -  I ordered dx [place], no mammo because pt refuses mammo - thanks

## 2012-11-06 NOTE — Telephone Encounter (Signed)
Left msg on vm stating md put ion order for U/S of (L) breast due to painful nodule in the 2:30 position. Wanting to know where pt been getting last mammograms. They last saw pt back in 2007. Would probably need a diagnostic as well...Raechel Chute

## 2012-11-06 NOTE — Telephone Encounter (Signed)
Called breast center spoke with Noelia gave md response...lmb

## 2012-11-06 NOTE — Patient Instructions (Signed)
It was good to see you today. We have reviewed your prior records including labs and tests today Medications reviewed and updated - will start increase dose aricept and increase dose sertraline, no other changes at this time. Your prescription(s) have been submitted to your pharmacy. Please take as directed and contact our office if you believe you are having problem(s) with the medication(s). we'll make referral for ultrasound of her left breast. Our office will contact you regarding appointment(s) once made. Continue working with Dr Terrace Arabia as discussed and consider study Please schedule followup in 3-4 months for review, call sooner if problems.

## 2012-11-07 MED ORDER — DONEPEZIL HCL 10 MG PO TABS: 5.0000 mg | ORAL_TABLET | Freq: Every day | ORAL | Status: DC

## 2012-11-09 ENCOUNTER — Other Ambulatory Visit: Payer: Self-pay | Admitting: Internal Medicine

## 2012-11-09 ENCOUNTER — Ambulatory Visit (INDEPENDENT_AMBULATORY_CARE_PROVIDER_SITE_OTHER): Payer: Medicare Other | Admitting: *Deleted

## 2012-11-09 ENCOUNTER — Ambulatory Visit
Admission: RE | Admit: 2012-11-09 | Discharge: 2012-11-09 | Disposition: A | Discharge status: Home / Self Care | Payer: Medicare Other | Source: Ambulatory Visit | Attending: Internal Medicine | Admitting: Internal Medicine

## 2012-11-09 DIAGNOSIS — F0390 Unspecified dementia without behavioral disturbance: Secondary | ICD-10-CM

## 2012-11-09 DIAGNOSIS — N644 Mastodynia: Secondary | ICD-10-CM

## 2012-11-09 NOTE — Progress Notes (Signed)
Patient was seen today for interest in the EXPEDITION 3 trial. The consenting process was performed. No study related test or procedures were completed prior to the consenting process. Patient and husband signed the informed consent and a copy was given to them for personal record. Patient scored 28/30 on the MMSE which made patient ineligible for the trial. Dr. Terrace Arabia was made aware of the results and patient eligibility. Dr. Terrace Arabia will follow up with patient for potential start of Namenda.

## 2012-11-12 ENCOUNTER — Ambulatory Visit: Payer: Self-pay

## 2012-12-19 ENCOUNTER — Encounter: Payer: Self-pay | Admitting: Internal Medicine

## 2012-12-19 ENCOUNTER — Ambulatory Visit (INDEPENDENT_AMBULATORY_CARE_PROVIDER_SITE_OTHER): Payer: Medicare Other | Admitting: Internal Medicine

## 2012-12-19 VITALS — BP 142/80 | HR 73 | Temp 97.7°F | Wt 146.0 lb

## 2012-12-19 DIAGNOSIS — R634 Abnormal weight loss: Secondary | ICD-10-CM

## 2012-12-19 DIAGNOSIS — M353 Polymyalgia rheumatica: Secondary | ICD-10-CM

## 2012-12-19 DIAGNOSIS — R197 Diarrhea, unspecified: Secondary | ICD-10-CM

## 2012-12-19 DIAGNOSIS — F0390 Unspecified dementia without behavioral disturbance: Secondary | ICD-10-CM

## 2012-12-19 MED ORDER — ALENDRONATE SODIUM 70 MG PO TABS: 70.0000 mg | ORAL_TABLET | ORAL | Status: DC

## 2012-12-19 NOTE — Progress Notes (Signed)
Subjective:    Patient ID: [name] Fox, female    DOB: 1940-12-17, 72 y.o.   MRN: [id number]  Diarrhea  This is a chronic problem. The current episode started more than 1 month ago. The problem occurs 2 to 4 times per day. The problem has been unchanged. The stool consistency is described as watery. The patient states that diarrhea does not awaken her from sleep. Associated symptoms include weight loss. Pertinent negatives include no abdominal pain, chills, coughing, fever, headaches or vomiting. The symptoms are aggravated by stress. There are no known risk factors. She has tried nothing for the symptoms. There is no history of inflammatory bowel disease, irritable bowel syndrome, a recent abdominal surgery or short gut syndrome.      Past Medical History  Diagnosis Date  . HERPES ZOSTER   . HEARING LOSS, BILATERAL   . OSTEOPENIA   . VITAMIN B12 DEFICIENCY   . Mild cognitive impairment with memory loss     neuropscy eval 10/2012 and follows with neuro  . ALLERGIC RHINITIS   . ANXIETY   . DEGENERATIVE JOINT DISEASE, KNEES, BILATERAL   . DYSLIPIDEMIA   . PMR (polymyalgia rheumatica) dx 10/2011    Initially dx GCA, then PMR clarified -on pred, follows with rheum  . Dementia 08/31/2010    Qualifier: Diagnosis of  By: Felicity Coyer MD, Raenette Rover     Review of Systems  Constitutional: Positive for weight loss. Negative for fever and chills.  Respiratory: Negative for cough.   Gastrointestinal: Positive for diarrhea. Negative for vomiting and abdominal pain.  Neurological: Negative for headaches.       Objective:   Physical Exam BP 142/80  Pulse 73  Temp(Src) 97.7 F (36.5 C) (Oral)  Wt 146 lb (66.225 kg)  BMI 23.58 kg/m2  SpO2 95% Wt Readings from Last 3 Encounters:  12/19/12 146 lb (66.225 kg)  11/06/12 151 lb (68.493 kg)  11/02/12 148 lb (67.132 kg)   Constitutional: She appears well-developed and well-nourished. No distress.  HENT: Head: Normocephalic and atraumatic. Ears: B  TMs ok, no erythema or effusion; Nose: Nose normal. Mouth/Throat: Oropharynx is clear and moist. No oropharyngeal exudate.  Eyes: Conjunctivae and EOM are normal. Pupils are equal, round, and reactive to light. No scleral icterus.  Neck: Normal range of motion. Neck supple. No JVD present. No thyromegaly present.  Cardiovascular: Normal rate, regular rhythm and normal heart sounds.  No murmur heard. No BLE edema. Pulmonary/Chest: Effort normal and breath sounds normal. No respiratory distress. She has no wheezes.  Abdominal: Soft. Bowel sounds are normal. She exhibits no distension. There is no tenderness. no masses Skin: Skin is warm and dry. No rash noted. No erythema.  Psychiatric: She has a normal mood and affect. Her behavior is normal. Judgment and thought content normal.   Lab Results  Component Value Date   WBC 11.5* 10/31/2011   HGB 11.4* 10/31/2011   HCT 34.1* 10/31/2011   PLT 674.0* 10/31/2011   GLUCOSE 158* 10/31/2011   CHOL 176 10/31/2011   TRIG 53.0 10/31/2011   HDL 53.70 10/31/2011   LDLDIRECT 212.7 03/02/2010   LDLCALC 112* 10/31/2011   ALT 38* 10/31/2011   AST 33 10/31/2011   NA 133* 10/31/2011   K 4.8 10/31/2011   CL 94* 10/31/2011   CREATININE 0.6 10/31/2011   BUN 10 10/31/2011   CO2 28 10/31/2011   TSH 1.15 10/31/2011       Assessment & Plan:   Diarrhea with weight loss In setting  of prednisone reduction for other rheumatologic disease No fever or history/exam to suggest infection Recent labs reviewed and unremarkable Last colonoscopy 2005 - dr Juanda Chance Refer back to GI for further evaluation and consideration of repeat colonoscopy - ?microscopic colitis

## 2012-12-19 NOTE — Assessment & Plan Note (Signed)
Labs reviewed and MRI brain 10/2011 Increase symptoms - ?related to dementia, pred or other underlying rheum problem S/p eval 02/2012 by neuro - felt consistent with mild dementia, confirmed on spring 2014 neuropsyc eval>mild cognitive dysfunction started aricept 5mg  qhs 06/2012, titrate up 10/2012 Also continue SSRI for "pseudodementia symptoms" as symptoms improved initially on tx for same -

## 2012-12-19 NOTE — Patient Instructions (Signed)
It was good to see you today. We have reviewed your prior records including labs and tests today we'll make referral to gastroenterology for consideration of colonoscopy because of weight loss and diarrhea. Our office will contact you regarding appointment(s) once made. Keep food journal between now and GI appointment - and consider week long trial no dairy or no grains.  Diet for Diarrhea, Adult Frequent, runny stools (diarrhea) may be caused or worsened by food or drink. Diarrhea may be relieved by changing your diet. Since diarrhea can last up to 7 days, it is easy for you to lose too much fluid from the body and become dehydrated. Fluids that are lost need to be replaced. Along with a modified diet, make sure you drink enough fluids to keep your urine clear or pale yellow. DIET INSTRUCTIONS  Ensure adequate fluid intake (hydration): have 1 cup (8 oz) of fluid for each diarrhea episode. Avoid fluids that contain simple sugars or sports drinks, fruit juices, whole milk products, and sodas. Your urine should be clear or pale yellow if you are drinking enough fluids. Hydrate with an oral rehydration solution that you can purchase at pharmacies, retail stores, and online. You can prepare an oral rehydration solution at home by mixing the following ingredients together:    tsp table salt.   tsp baking soda.   tsp salt substitute containing potassium chloride.  1  tablespoons sugar.  1 L (34 oz) of water.  Certain foods and beverages may increase the speed at which food moves through the gastrointestinal (GI) tract. These foods and beverages should be avoided and include:  Caffeinated and alcoholic beverages.  High-fiber foods, such as raw fruits and vegetables, nuts, seeds, and whole grain breads and cereals.  Foods and beverages sweetened with sugar alcohols, such as xylitol, sorbitol, and mannitol.  Some foods may be well tolerated and may help thicken stool including:  Starchy foods,  such as rice, toast, pasta, low-sugar cereal, oatmeal, grits, baked potatoes, crackers, and bagels.   Bananas.   Applesauce.  Add probiotic-rich foods to help increase healthy bacteria in the GI tract, such as yogurt and fermented milk products. RECOMMENDED FOODS AND BEVERAGES Starches Choose foods with less than 2 g of fiber per serving.  Recommended:  White, [place], and pita breads, plain rolls, buns, bagels. Plain muffins, matzo. Soda, saltine, or graham crackers. Pretzels, melba toast, zwieback. Cooked cereals made with water: cornmeal, farina, cream cereals. Dry cereals: refined corn, wheat, rice. Potatoes prepared any way without skins, refined macaroni, spaghetti, noodles, refined rice.  Avoid:  Bread, rolls, or crackers made with whole wheat, multi-grains, rye, bran seeds, nuts, or coconut. Corn tortillas or taco shells. Cereals containing whole grains, multi-grains, bran, coconut, nuts, raisins. Cooked or dry oatmeal. Coarse wheat cereals, granola. Cereals advertised as "high-fiber." Potato skins. Whole grain pasta, wild or brown rice. Popcorn. Sweet potatoes, yams. Sweet rolls, doughnuts, waffles, pancakes, sweet breads. Vegetables  Recommended: Strained tomato and vegetable juices. Most well-cooked and canned vegetables without seeds. Fresh: Tender lettuce, cucumber without the skin, cabbage, spinach, bean sprouts.  Avoid: Fresh, cooked, or canned: Artichokes, baked beans, beet greens, broccoli, Brussels sprouts, corn, kale, legumes, peas, sweet potatoes. Cooked: Green or red cabbage, spinach. Avoid large servings of any vegetables because vegetables shrink when cooked, and they contain more fiber per serving than fresh vegetables. Fruit  Recommended: Cooked or canned: Apricots, applesauce, cantaloupe, cherries, fruit cocktail, grapefruit, grapes, kiwi, mandarin oranges, peaches, pears, plums, watermelon. Fresh: Apples without skin, ripe banana,  grapes, cantaloupe, cherries,  grapefruit, peaches, oranges, plums. Keep servings limited to  cup or 1 piece.  Avoid: Fresh: Apples with skin, apricots, mangoes, pears, raspberries, strawberries. Prune juice, stewed or dried prunes. Dried fruits, raisins, dates. Large servings of all fresh fruits. Protein  Recommended: Ground or well-cooked tender beef, ham, veal, lamb, pork, or poultry. Eggs. Fish, oysters, shrimp, lobster, other seafoods. Liver, organ meats.  Avoid: Tough, fibrous meats with gristle. Peanut butter, smooth or chunky. Cheese, nuts, seeds, legumes, dried peas, beans, lentils. Dairy  Recommended: Yogurt, lactose-free milk, kefir, drinkable yogurt, buttermilk, soy milk, or plain hard cheese.  Avoid: Milk, chocolate milk, beverages made with milk, such as milkshakes. Soups  Recommended: Bouillon, broth, or soups made from allowed foods. Any strained soup.  Avoid: Soups made from vegetables that are not allowed, cream or milk-based soups. Desserts and Sweets  Recommended: Sugar-free gelatin, sugar-free frozen ice pops made without sugar alcohol.  Avoid: Plain cakes and cookies, pie made with fruit, pudding, custard, cream pie. Gelatin, fruit, ice, sherbet, frozen ice pops. Ice cream, ice milk without nuts. Plain hard candy, honey, jelly, molasses, syrup, sugar, chocolate syrup, gumdrops, marshmallows. Fats and Oils  Recommended: Limit fats to less than 8 tsp per day.  Avoid: Seeds, nuts, olives, avocados. Margarine, butter, cream, mayonnaise, salad oils, plain salad dressings. Plain gravy, crisp bacon without rind. Beverages  Recommended: Water, decaffeinated teas, oral rehydration solutions, sugar-free beverages not sweetened with sugar alcohols.  Avoid: Fruit juices, caffeinated beverages (coffee, tea, soda), alcohol, sports drinks, or lemon-lime soda. Condiments  Recommended: Ketchup, mustard, horseradish, vinegar, cocoa powder. Spices in moderation: allspice, basil, bay leaves, celery powder or  leaves, cinnamon, cumin powder, curry powder, ginger, mace, marjoram, onion or garlic powder, oregano, paprika, parsley flakes, ground pepper, rosemary, sage, savory, tarragon, thyme, turmeric.  Avoid: Coconut, honey. Document Released: 09/24/2003 Document Revised: 03/28/2012 Document Reviewed: 11/18/2011 [facility] Patient Information 2014 [place], [place].

## 2012-12-19 NOTE — Assessment & Plan Note (Signed)
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 in setting of  headache and "swelling" B temples - declined bx at advice of rheum 10/2011 Dareen Piano started high dose pred 10/2011 with improvement of symptoms and sed rate decrease from 103 to 14 on 11/16/11 at [facility] (esr 19 and CRP 1.3 on 01/25/12) - Trial off pred early 12/2011 with rapid recurrence of symptoms resumed pred 40 mg/d 12/2011 thru 02/2012 when stopped pred again-  S/p 2nd local rheum eval on same because of rising ESR - now with Truslow Claris Pong [facility]) since 05/2012  Feels dx more consistent with PMR than GCA Reviewed MRI/MRA brain 10/2011 - no vasculitis findings Tapering off pred, down to 5g/d symptoms controlled at present - no [facility] eval planned The current medical regimen is effective;  continue present plan and medications.   Lab Results  Component Value Date   ESRSEDRATE 103* 10/31/2011   Lab Results  Component Value Date   ESRSEDRATE 70* 06/06/2012

## 2012-12-20 ENCOUNTER — Telehealth: Payer: Self-pay | Admitting: Internal Medicine

## 2012-12-20 NOTE — Telephone Encounter (Signed)
Spoke with patient and she is having weight loss and diarrhea. She just wants to be seen soon. Scheduled with Dr. Juanda Chance on 12/21/12 at 3:30 PM.

## 2012-12-21 ENCOUNTER — Encounter: Payer: Self-pay | Admitting: Internal Medicine

## 2012-12-21 ENCOUNTER — Ambulatory Visit (INDEPENDENT_AMBULATORY_CARE_PROVIDER_SITE_OTHER): Payer: Medicare Other | Admitting: Internal Medicine

## 2012-12-21 ENCOUNTER — Other Ambulatory Visit (INDEPENDENT_AMBULATORY_CARE_PROVIDER_SITE_OTHER): Payer: Medicare Other

## 2012-12-21 VITALS — BP 136/72 | HR 64 | Ht 66.0 in | Wt 144.0 lb

## 2012-12-21 DIAGNOSIS — R197 Diarrhea, unspecified: Secondary | ICD-10-CM

## 2012-12-21 DIAGNOSIS — R634 Abnormal weight loss: Secondary | ICD-10-CM

## 2012-12-21 LAB — TSH: TSH: 1.81 u[IU]/mL (ref 0.35–5.50)

## 2012-12-21 MED ORDER — DICYCLOMINE HCL 10 MG PO CAPS: 10.0000 mg | ORAL_CAPSULE | Freq: Three times a day (TID) | ORAL | Status: DC

## 2012-12-21 NOTE — Patient Instructions (Addendum)
You will go to the basement today for labs Stop Aricept Dr Felicity Coyer

## 2012-12-21 NOTE — Progress Notes (Signed)
[name] Fox Apr 05, 1941 MRN [id number]        History of Present Illness:  This is a 72 year old white female with several months history of  loose stools occuring 3-4 times a day but not at night. Her usual bowel habits used to be  one bowel movement every morning. She denies  abdominal pain or rectal bleeding ,her weight prior to the onset of diarrhea was 152 pounds and she reports weight over 145  pounds now.She weighed up to 165 lbs  In 2013 while on high doses of Prenisone for temporal arteritis. She admits to decreased appetite and consequently smaller portions.. She had a screening colonoscopy in September 2005 which showed mild diverticulosis and hemorrhoids. She was diagnosed with PMR about a year ago and has been on a slow prednisone taper currently down to prednisone 5 mg daily. She has mild dementia for which she takes Aricept 5 mg daily. There is a history of B12 deficiency and anemia with hemoglobin of 11.5.   Past Medical History  Diagnosis Date  . HERPES ZOSTER   . HEARING LOSS, BILATERAL   . OSTEOPENIA   . VITAMIN B12 DEFICIENCY   . Mild cognitive impairment with memory loss     neuropscy eval 10/2012 and follows with neuro  . ALLERGIC RHINITIS   . ANXIETY   . DEGENERATIVE JOINT DISEASE, KNEES, BILATERAL   . DYSLIPIDEMIA   . PMR (polymyalgia rheumatica) dx 10/2011    Initially dx GCA, then PMR clarified -on pred, follows with rheum  . Dementia 08/31/2010    Qualifier: Diagnosis of  By: Felicity Coyer MD, Raenette Rover    Past Surgical History  Procedure Laterality Date  . Bilateral mastectomy      while removing cosmetic silicone implants (NO Breast cancer)  . Bilateral silicone breast explant  1993    Due to rupture/leak @ [facility]    reports that she has never smoked. She has never used smokeless tobacco. She reports that she drinks about 1.8 ounces of alcohol per week. She reports that she does not use illicit drugs. family history includes Pancreatic cancer in her  father. Allergies  Allergen Reactions  . Penicillins Shortness Of Breath, Swelling and Rash  . Sulfasalazine Rash        Review of Systems: Negative for dysphagia odynophagia, abdominal pain lactose intolerance or rectal bleeding  The remainder of the 10 point ROS is negative except as outlined in H&P   Physical Exam: General appearance  Well developed, in no distress. Eyes- non icteric. HEENT nontraumatic, normocephalic. Mouth no lesions, tongue papillated, no cheilosis. Neck supple without adenopathy, thyroid not enlarged, no carotid bruits, no JVD. Lungs Clear to auscultation bilaterally. Cor normal S1, normal S2, regular rhythm, no murmur,  quiet precordium. Abdomen: Soft nontender with hyperactive bowel sounds. Mild diffuse tenderness in left lower, right lower quadrant  and epigastrium. Rectal: Small amount of soft Hemoccult negative stool Extremities no pedal edema. Skin no lesions. Neurological alert and oriented x 3. Psychological normal mood and affect.  Assessment and Plan:  72 year old white female with change in bowel habits  toward diarrhea. This could be caused by Aricept. 5 mg /day  She will discontinue this medication right away. We will also obtain sprue profile. TSH level, pre albumin and albumin. We will start patient on Bentyl 10 mg twice a day to slow down the transit time. We will obtain stool for culture, lactoferrin, and C. difficile. If the symptoms don't improve within few weeks  we  will consider repeating her colonoscopy is  due in September 2015.  Consider flex sigmoidoscopy/ colonoscopy to r/o microscopic colitis.   12/21/2012 [name]

## 2012-12-22 LAB — PREALBUMIN: Prealbumin: 19 mg/dL (ref 17.0–34.0)

## 2012-12-24 LAB — GLIADIN ANTIBODIES, SERUM
Gliadin IgA: 89.1 U/mL — ABNORMAL HIGH (ref <20)
Gliadin IgG: 10.5 U/mL (ref <20)

## 2012-12-25 LAB — RETICULIN ANTIBODIES, IGA W TITER: Reticulin Ab, IgA: NEGATIVE

## 2013-01-09 ENCOUNTER — Other Ambulatory Visit: Payer: Self-pay | Admitting: Dermatology

## 2013-01-14 ENCOUNTER — Telehealth: Payer: Self-pay | Admitting: Internal Medicine

## 2013-01-14 NOTE — Telephone Encounter (Signed)
Spoke with patient and reminded her that she was suppose to collect stool specimens for Dr. Juanda Chance so we would know what is causing the diarrhea. She will collect this and return them ASAP.

## 2013-01-15 ENCOUNTER — Other Ambulatory Visit: Payer: Self-pay | Admitting: Internal Medicine

## 2013-01-15 ENCOUNTER — Ambulatory Visit: Payer: Medicare Other

## 2013-01-15 DIAGNOSIS — R197 Diarrhea, unspecified: Secondary | ICD-10-CM

## 2013-01-16 ENCOUNTER — Ambulatory Visit: Payer: Medicare Other

## 2013-01-16 DIAGNOSIS — R197 Diarrhea, unspecified: Secondary | ICD-10-CM

## 2013-01-16 LAB — GASTROINTESTINAL PATHOGEN PANEL PCR
C. difficile Tox A/B, PCR: NEGATIVE
E coli (STEC) stx1/stx2, PCR: NEGATIVE
Rotavirus A, PCR: NEGATIVE
Salmonella, PCR: NEGATIVE
Shigella, PCR: NEGATIVE

## 2013-01-17 ENCOUNTER — Telehealth: Payer: Self-pay | Admitting: Internal Medicine

## 2013-01-17 NOTE — Telephone Encounter (Signed)
Spoke with patient and let her know her stool reports are not back yet.

## 2013-01-19 LAB — STOOL CULTURE

## 2013-01-22 LAB — STOOL CULTURE

## 2013-01-24 ENCOUNTER — Encounter: Payer: Self-pay | Admitting: *Deleted

## 2013-02-05 ENCOUNTER — Ambulatory Visit (INDEPENDENT_AMBULATORY_CARE_PROVIDER_SITE_OTHER): Payer: Medicare Other | Admitting: Internal Medicine

## 2013-02-05 ENCOUNTER — Encounter: Payer: Self-pay | Admitting: Internal Medicine

## 2013-02-05 ENCOUNTER — Other Ambulatory Visit (INDEPENDENT_AMBULATORY_CARE_PROVIDER_SITE_OTHER): Payer: Medicare Other

## 2013-02-05 VITALS — BP 158/78 | HR 54 | Temp 97.3°F | Wt 140.4 lb

## 2013-02-05 DIAGNOSIS — E785 Hyperlipidemia, unspecified: Secondary | ICD-10-CM

## 2013-02-05 DIAGNOSIS — F0390 Unspecified dementia without behavioral disturbance: Secondary | ICD-10-CM

## 2013-02-05 DIAGNOSIS — M353 Polymyalgia rheumatica: Secondary | ICD-10-CM

## 2013-02-05 LAB — LDL CHOLESTEROL, DIRECT: Direct LDL: 114.9 mg/dL

## 2013-02-05 LAB — CBC WITH DIFFERENTIAL/PLATELET
Basophils Absolute: 0.1 10*3/uL (ref 0.0–0.1)
Basophils Relative: 1.3 % (ref 0.0–3.0)
Eosinophils Absolute: 0.2 10*3/uL (ref 0.0–0.7)
Hemoglobin: 13.2 g/dL (ref 12.0–15.0)
Lymphocytes Relative: 26.4 % (ref 12.0–46.0)
MCHC: 34 g/dL (ref 30.0–36.0)
Monocytes Relative: 10.7 % (ref 3.0–12.0)
Neutro Abs: 3.7 10*3/uL (ref 1.4–7.7)
Neutrophils Relative %: 58.6 % (ref 43.0–77.0)
RBC: 4.47 Mil/uL (ref 3.87–5.11)
RDW: 15 % — ABNORMAL HIGH (ref 11.5–14.6)

## 2013-02-05 LAB — BASIC METABOLIC PANEL
CO2: 30 mEq/L (ref 19–32)
Chloride: 99 mEq/L (ref 96–112)
Creatinine, Ser: 0.7 mg/dL (ref 0.4–1.2)
Potassium: 4 mEq/L (ref 3.5–5.1)

## 2013-02-05 LAB — LIPID PANEL
HDL: 88.3 mg/dL (ref >39.00)
Total CHOL/HDL Ratio: 2
VLDL: 7.8 mg/dL (ref 0.0–40.0)

## 2013-02-05 MED ORDER — MEMANTINE HCL ER 14 MG PO CP24: 1.0000 | ORAL_CAPSULE | Freq: Every day | ORAL | Status: DC

## 2013-02-05 MED ORDER — DONEPEZIL HCL 10 MG PO TABS: 10.0000 mg | ORAL_TABLET | Freq: Every day | ORAL | Status: DC

## 2013-02-05 NOTE — Assessment & Plan Note (Signed)
On prava for same - tol well Check annually and titrate as needed The current medical regimen is effective;  continue present plan and medications.  

## 2013-02-05 NOTE — Assessment & Plan Note (Signed)
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 in setting of  headache and "swelling" B temples - declined bx at advice of rheum 10/2011 [facility] started high dose pred 10/2011 with improvement of symptoms and sed rate decrease from 103 to 14 on 11/16/11 at [facility] (esr 19 and CRP 1.3 on 01/25/12) - Trial off pred early 12/2011 with rapid recurrence of symptoms resumed pred 40 mg/d 12/2011 thru 02/2012 when stopped pred again-  S/p 2nd local rheum eval on same because of rising ESR - now with Truslow (prev [facility]) since 05/2012  Feels dx more consistent with PMR than GCA Reviewed MRI/MRA brain 10/2011 - no vasculitis findings Tapering off pred, down to 5g/d symptoms controlled at present - no [facility] eval planned The current medical regimen is effective;  continue present plan and medications.   Lab Results  Component Value Date   ESRSEDRATE 103* 10/31/2011   Lab Results  Component Value Date   ESRSEDRATE 70* 06/06/2012   

## 2013-02-05 NOTE — Patient Instructions (Signed)
It was good to see you today. We have reviewed your prior records including labs and tests today Test(s) ordered today. Your results will be released to MyChart (or called to you) after review, usually within 72hours after test completion. If any changes need to be made, you will be notified at that same time. Medications reviewed and updated - will start Namenda for memory once daily, no other changes at this time. Your prescription(s) have been submitted to your pharmacy. Please take as directed and contact our office if you believe you are having problem(s) with the medication(s). Continue working with Dr Terrace Arabia as discussed and Dr Juanda Chance as needed Please schedule followup in 6 months for review, call sooner if problems.

## 2013-02-05 NOTE — Assessment & Plan Note (Signed)
Labs reviewed and MRI brain 10/2011 Progressive symptoms -  S/p eval 02/2012 by neuro - felt consistent with mild dementia, confirmed on spring 2014 neuropsyc eval>mild cognitive dysfunction started aricept 5mg  qhs 06/2012, titrate up 10/2012 when ineligible for study trial 10/2012 Add namenda now and follow up neuro as planned Also continue SSRI for "pseudodementia symptoms" as symptoms improved initially on tx for same -

## 2013-02-05 NOTE — Progress Notes (Signed)
  Subjective:    Patient ID: [name] Fox, female    DOB: 1940-12-18, 72 y.o.   MRN: [id number]  HPI  Here for follow up - reviewed chronic medical issues and interval issues    Past Medical History  Diagnosis Date  . HERPES ZOSTER   . HEARING LOSS, BILATERAL   . OSTEOPENIA   . VITAMIN B12 DEFICIENCY   . Mild cognitive impairment with memory loss     neuropscy eval 10/2012 and follows with neuro  . ALLERGIC RHINITIS   . ANXIETY   . DEGENERATIVE JOINT DISEASE, KNEES, BILATERAL   . DYSLIPIDEMIA   . PMR (polymyalgia rheumatica) dx 10/2011    Initially dx GCA, then PMR clarified -on pred, follows with rheum  . Dementia 08/31/2010    Qualifier: Diagnosis of  By: Felicity Coyer MD, Raenette Rover     Review of Systems  Constitutional: Positive for unexpected weight change. Negative for fatigue.  Respiratory: Negative for cough, shortness of breath and wheezing.   Cardiovascular: Negative for chest pain and palpitations.  Neurological: Negative for dizziness and headaches.       Progressive memory problems  Psychiatric/Behavioral: Negative for confusion, sleep disturbance and dysphoric mood. The patient is not nervous/anxious.        Objective:   Physical Exam  BP 158/78  Pulse 54  Temp(Src) 97.3 F (36.3 C) (Oral)  Wt 140 lb 6.4 oz (63.685 kg)  BMI 22.67 kg/m2  SpO2 96% Wt Readings from Last 3 Encounters:  02/05/13 140 lb 6.4 oz (63.685 kg)  12/21/12 144 lb (65.318 kg)  12/19/12 146 lb (66.225 kg)   Constitutional: She appears well-developed and well-nourished. No distress.   Cardiovascular: Normal rate, regular rhythm and normal heart sounds.  No murmur heard. No BLE edema. Pulmonary/Chest: Effort normal and breath sounds normal bilaterally. No respiratory distress. She has no wheezes.  Psychiatric: She has a normal mood and affect. Her behavior is normal. Good insight. Judgment and thought content normal.       Lab Results  Component Value Date   WBC 11.5* 10/31/2011   HGB  11.4* 10/31/2011   HCT 34.1* 10/31/2011   PLT 674.0* 10/31/2011   CHOL 176 10/31/2011   TRIG 53.0 10/31/2011   HDL 53.70 10/31/2011   LDLDIRECT 212.7 03/02/2010   ALT 38* 10/31/2011   AST 33 10/31/2011   NA 133* 10/31/2011   K 4.8 10/31/2011   CL 94* 10/31/2011   CREATININE 0.6 10/31/2011   BUN 10 10/31/2011   CO2 28 10/31/2011   TSH 1.81 12/21/2012   Lab Results  Component Value Date   VITAMINB12 220 08/31/2010   Lab Results  Component Value Date   ESRSEDRATE 70* 06/06/2012   Lab Results  Component Value Date   ANA NEG 06/06/2012    Assessment & Plan:   See problem list. Medications and labs reviewed today.   diarrhea -symptoms currently resolved. Status post GI evaluation in June 2014 reviewed. Question celiac sprue based on labs. Patient not currently following a diet and has not followed up for small bowel biopsy. Patient understands to call GI if continued or recurrent problems

## 2013-02-08 ENCOUNTER — Encounter: Payer: Self-pay | Admitting: Internal Medicine

## 2013-02-08 MED ORDER — MEMANTINE HCL ER 28 MG PO CP24: 1.0000 | ORAL_CAPSULE | Freq: Every day | ORAL | Status: DC

## 2013-02-08 NOTE — Telephone Encounter (Signed)
Okay to provide samples?  

## 2013-02-11 ENCOUNTER — Telehealth: Payer: Self-pay | Admitting: Internal Medicine

## 2013-02-11 ENCOUNTER — Encounter: Payer: Self-pay | Admitting: *Deleted

## 2013-02-11 NOTE — Telephone Encounter (Signed)
Patient having continued diarrhea, per office note from 12/2012 would consider flex or colonoscopy.  Patient is rescheduled to tomorrow at 1:30

## 2013-02-12 ENCOUNTER — Encounter: Payer: Self-pay | Admitting: Internal Medicine

## 2013-02-12 ENCOUNTER — Ambulatory Visit (INDEPENDENT_AMBULATORY_CARE_PROVIDER_SITE_OTHER): Payer: Medicare Other | Admitting: Internal Medicine

## 2013-02-12 VITALS — BP 124/70 | HR 72 | Ht 66.0 in | Wt 141.4 lb

## 2013-02-12 DIAGNOSIS — R197 Diarrhea, unspecified: Secondary | ICD-10-CM

## 2013-02-12 MED ORDER — MOVIPREP 100 G PO SOLR: 1.0000 | Freq: Once | ORAL | Status: DC

## 2013-02-12 NOTE — Patient Instructions (Addendum)
You have been scheduled for an endoscopy and colonoscopy with propofol. Please follow the written instructions given to you at your visit today. Please pick up your prep at the pharmacy within the next 1-3 days. If you use inhalers (even only as needed), please bring them with you on the day of your procedure. Your physician has requested that you go to www.startemmi.com and enter the access code given to you at your visit today. This web site gives a general overview about your procedure. However, you should still follow specific instructions given to you by our office regarding your preparation for the procedure.  Please purchase b12 1000 mcg capsules (over the counter) to take 1 capsule once daily.  CC: Dr Felicity Coyer, Dr Kellie Simmering

## 2013-02-12 NOTE — Progress Notes (Signed)
[name] Fox [birth year] MRN [id number]  History of Present Illness:  This is a 72 year old, white female with diarrhea. She was seen on June 2014 for the same problem. We have discontinued Aricept and it did not seem to make any difference. She has been on prednisone taper for PMR currently on 5 mg a day. She denies any rectal bleeding. Her C. difficile toxin was negative. Her sedimentation rate is elevated to 79. Her B12 level was 220, and the sprue profile was partly positive showing antigliadin antibody IgA positive at 89.1. Rest of the profile was normal including tissue transglutaminase of 5.7. He has a history of B12 deficiency, last B12 level 220 ug. Dementia. Steroid dependence. Last colonoscopy in 2000  ( in [place]) and 2005 showed diverticulosis and hemorrhoids.There is a personal hx of adenomatous polyp and hx of Colon cancer in 2 indirect relatives.   Past Medical History  Diagnosis Date  . HERPES ZOSTER   . HEARING LOSS, BILATERAL   . OSTEOPENIA   . VITAMIN B12 DEFICIENCY   . Mild cognitive impairment with memory loss     neuropscy eval 10/2012 and follows with neuro  . ALLERGIC RHINITIS   . ANXIETY   . DEGENERATIVE JOINT DISEASE, KNEES, BILATERAL   . DYSLIPIDEMIA   . PMR (polymyalgia rheumatica) dx 10/2011    Initially dx GCA, then PMR clarified -on pred, follows with rheum  . Dementia 08/31/2010    Qualifier: Diagnosis of  By: Felicity Coyer MD, Raenette Rover Fibrocystic breast disease   . Diverticulosis   . Hemorrhoids   . Adenomatous colon polyp 05/26/1999    procedure at [facility] in [place], [place]   Past Surgical History  Procedure Laterality Date  . Bilateral mastectomy      while removing cosmetic silicone implants (NO Breast cancer)  . Bilateral silicone breast explant  1993    Due to rupture/leak @ [facility]    reports that she has never smoked. She has never used smokeless tobacco. She reports that she drinks about 1.8 ounces of alcohol per week. She  reports that she does not use illicit drugs. family history includes Colon cancer in her paternal aunt and paternal grandfather; Heart disease in her brother; Inflammatory bowel disease in her cousin; and Pancreatic cancer in her father. Allergies  Allergen Reactions  . Penicillins Shortness Of Breath, Swelling and Rash  . Sulfasalazine Rash        Review of Systems: Denies heartburn abdominal pain rectal bleeding  The remainder of the 10 point ROS is negative except as outlined in H&P   Physical Exam: General appearance  Well developed, in no distress. Psychological normal mood and affect.  Assessment and Plan:  Problem #1 She continues to have the same problems with fecal incontinence and urgency . We will proceed with an upper endoscopy and small bowel biopsy to rule out villous atrophy c/w sprue.Marland Kitchen She will restart her oral B12 supplements 1000 mcg daily. We will also schedule her for a colonoscopy with random biopsies of the colon to rule out microscopic colitis. If she is found to have microcytic colitis she may benefit from increasing her steroids. The flareup of the diarrhea may be related to prednisone taper. Patient agrees with the plans for endoscopic studies. In the meantime she will continue on all medications   02/12/2013 [name]

## 2013-02-13 ENCOUNTER — Encounter: Payer: Self-pay | Admitting: Internal Medicine

## 2013-02-20 ENCOUNTER — Encounter: Payer: Self-pay | Admitting: Internal Medicine

## 2013-02-20 ENCOUNTER — Ambulatory Visit (AMBULATORY_SURGERY_CENTER): Payer: Medicare Other | Admitting: Internal Medicine

## 2013-02-20 VITALS — BP 172/70 | HR 48 | Temp 96.9°F | Resp 16 | Ht 66.0 in | Wt 141.0 lb

## 2013-02-20 DIAGNOSIS — D1330 Benign neoplasm of unspecified part of small intestine: Secondary | ICD-10-CM

## 2013-02-20 DIAGNOSIS — D126 Benign neoplasm of colon, unspecified: Secondary | ICD-10-CM

## 2013-02-20 DIAGNOSIS — K900 Celiac disease: Secondary | ICD-10-CM

## 2013-02-20 DIAGNOSIS — D131 Benign neoplasm of stomach: Secondary | ICD-10-CM

## 2013-02-20 DIAGNOSIS — R197 Diarrhea, unspecified: Secondary | ICD-10-CM

## 2013-02-20 MED ORDER — SODIUM CHLORIDE 0.9 % IV SOLN: 500.0000 mL | INTRAVENOUS | Status: DC

## 2013-02-20 NOTE — Progress Notes (Signed)
1412 SINUS BRADY NOTED ATROPINE 0.4MG  IV

## 2013-02-20 NOTE — Patient Instructions (Signed)
YOU HAD AN ENDOSCOPIC PROCEDURE TODAY AT THE [facility]: Refer to the procedure report that was given to you for any specific questions about what was found during the examination.  If the procedure report does not answer your questions, please call your gastroenterologist to clarify.  If you requested that your care partner not be given the details of your procedure findings, then the procedure report has been included in a sealed envelope for you to review at your convenience later.  YOU SHOULD EXPECT: Some feelings of bloating in the abdomen. Passage of more gas than usual.  Walking can help get rid of the air that was put into your GI tract during the procedure and reduce the bloating. If you had a lower endoscopy (such as a colonoscopy or flexible sigmoidoscopy) you may notice spotting of blood in your stool or on the toilet paper. If you underwent a bowel prep for your procedure, then you may not have a normal bowel movement for a few days.  DIET: Your first meal following the procedure should be a light meal and then it is ok to progress to your normal diet.  A half-sandwich or bowl of soup is an example of a good first meal.  Heavy or fried foods are harder to digest and may make you feel nauseous or bloated.  Likewise meals heavy in dairy and vegetables can cause extra gas to form and this can also increase the bloating.  Drink plenty of fluids but you should avoid alcoholic beverages for 24 hours.  ACTIVITY: Your care partner should take you home directly after the procedure.  You should plan to take it easy, moving slowly for the rest of the day.  You can resume normal activity the day after the procedure however you should NOT DRIVE or use heavy machinery for 24 hours (because of the sedation medicines used during the test).    SYMPTOMS TO REPORT IMMEDIATELY: A gastroenterologist can be reached at any hour.  During normal business hours, 8:30 AM to 5:00 PM Monday through Friday,  call [phone].  After hours and on weekends, please call the GI answering service at [phone] who will take a message and have the physician on call contact you.   Following lower endoscopy (colonoscopy or flexible sigmoidoscopy):  Excessive amounts of blood in the stool  Significant tenderness or worsening of abdominal pains  Swelling of the abdomen that is new, acute  Fever of 100F or higher  Following upper endoscopy (EGD)  Vomiting of blood or coffee ground material  New chest pain or pain under the shoulder blades  Painful or persistently difficult swallowing  New shortness of breath  Fever of 100F or higher  Black, tarry-looking stools  FOLLOW UP: If any biopsies were taken you will be contacted by phone or by letter within the next 1-3 weeks.  Call your gastroenterologist if you have not heard about the biopsies in 3 weeks.  Our staff will call the home number listed on your records the next business day following your procedure to check on you and address any questions or concerns that you may have at that time regarding the information given to you following your procedure. This is a courtesy call and so if there is no answer at the home number and we have not heard from you through the emergency physician on call, we will assume that you have returned to your regular daily activities without incident.  SIGNATURES/CONFIDENTIALITY: You and/or your care   partner have signed paperwork which will be entered into your electronic medical record.  These signatures attest to the fact that that the information above on your After Visit Summary has been reviewed and is understood.  Full responsibility of the confidentiality of this discharge information lies with you and/or your care-partner.  

## 2013-02-20 NOTE — Progress Notes (Signed)
Patient did not experience any of the following events: a burn prior to discharge; a fall within the facility; wrong site/side/patient/procedure/implant event; or a hospital transfer or hospital admission upon discharge from the facility. (G8907) Patient did not have preoperative order for IV antibiotic SSI prophylaxis. (G8918)  

## 2013-02-20 NOTE — Progress Notes (Signed)
Called to room to assist during endoscopic procedure.  Patient ID and intended procedure confirmed with present staff. Received instructions for my participation in the procedure from the performing physician.  

## 2013-02-20 NOTE — Op Note (Signed)
[facility] [address] [place], [address]   COLONOSCOPY PROCEDURE REPORT  PATIENT: [name] Fox, [name] Fox  MR#: [id number] BIRTHDATE: Feb 09, 1941 , 72  yrs. old GENDER: Female ENDOSCOPIST: Hart Carwin, MD REFERRED EX:[id number] Felicity Coyer, M.D. PROCEDURE DATE:  02/20/2013 PROCEDURE:   Colonoscopy with cold biopsy polypectomy First Screening Colonoscopy - Avg.  risk and is 50 yrs.  old or older - No.      History of Adenoma - Now for follow-up colonoscopy & has been > or = to 3 yrs.  N/A  Polyps Removed Today? Yes. ASA CLASS:   Class II INDICATIONS:last colonoscopy 2000., screening, also diarrhea MEDICATIONS: MAC sedation, administered by CRNA and propofol (Diprivan) 200mg  IV  DESCRIPTION OF PROCEDURE:   After the risks benefits and alternatives of the procedure were thoroughly explained, informed consent was obtained.  A digital rectal exam revealed no abnormalities of the rectum.   The LB [id number]  endoscope was introduced through the anus and advanced to the cecum, which was identified by both the appendix and ileocecal valve. No adverse events experienced.   The quality of the prep was good, using MoviPrep  The instrument was then slowly withdrawn as the colon was fully examined.      COLON FINDINGS: A smooth sessile polyp ranging between 3-28mm in size was found in the ascending colon.  A polypectomy was performed with cold forceps.  The resection was complete and the polyp tissue was completely retrieved.   Mild diverticulosis was noted throughout the entire examined colon.  Retroflexed views revealed no abnormalities. The time to cecum=13 minutes 10 seconds.  Withdrawal time=6 minutes 0 seconds.  The scope was withdrawn and the procedure completed. COMPLICATIONS: There were no complications.  ENDOSCOPIC IMPRESSION: 1.   Sessile polyp ranging between 3-47mm in size was found in the ascending colon; polypectomy was performed with cold forceps 2.    Mild diverticulosis was noted throughout the entire examined colon 3. random biopsies to r/o microscopic colitis 4 .internal hemorrhoids  RECOMMENDATIONS: Await biopsy results   eSigned:  Hart Carwin, MD 02/20/2013 2:46 PM   cc:   PATIENT NAME:  [name] Fox, [name] Fox MR#: [id number]

## 2013-02-20 NOTE — Op Note (Signed)
[facility] [address] [place], [address]   ENDOSCOPY PROCEDURE REPORT  PATIENT: [name] Fox, [name] Fox  MR#: [id number] BIRTHDATE: 04/06/1941 , 72  yrs. old GENDER: Female ENDOSCOPIST: Hart Carwin, MD REFERRED BY:  Rene Paci, M.D. PROCEDURE DATE:  02/20/2013 PROCEDURE:  EGD w/ biopsy ASA CLASS:     Class II INDICATIONS:  diarrhea, positive antigliadin antibody IgA, hx B12 deficiency MEDICATIONS: MAC sedation, administered by CRNA and propofol (Diprivan) 200mg  IV TOPICAL ANESTHETIC: none  DESCRIPTION OF PROCEDURE: After the risks benefits and alternatives of the procedure were thoroughly explained, informed consent was obtained.  The LB [id number] endoscope was introduced through the mouth and advanced to the second portion of the duodenum. Without limitations.  The instrument was slowly withdrawn as the mucosa was fully examined.      [esophagus: Esophageal mucosa appeared normal in the proximal mid and distal esophagus. There was some mild non-obstructing fibrous ring at the GE junction there was slight erythema on the  gastric side O. the Z line  biopsies were taken to rule out esophagitis , there was a 3 cm hiatal hernia stomach: Rugal pattern was normal. Retroflexion of the endoscope revealed normal fundus and cardia. Gastric antrum and pyloric outlet were unremarkable. Biopsies were taken from gastric body  to rule out atrophic gastritis duodenum duodenal bulb and descending duodenum appeared normal. Multiple biopsies were taken from the descending duodenum to rule out villous atrophy      The scope was then withdrawn from the patient and the procedure completed.  COMPLICATIONS: There were no complications. ENDOSCOPIC IMPRESSION:  mild esophagitis at GE junction. Status post biopsies 3 cm hiatal hernia Normal appearing stomach status post antral biopsies Status post small bowel biopsies to rule out villous  atrophy RECOMMENDATIONS: Await biopsy results  REPEAT EXAM: no  eSigned:  Hart Carwin, MD 02/20/2013 2:39 PM   CC:  PATIENT NAME:  [name] Fox, [name] Fox MR#: [id number]

## 2013-02-22 ENCOUNTER — Telehealth: Payer: Self-pay

## 2013-02-22 NOTE — Telephone Encounter (Signed)
  Follow up Call-  Call back number 02/20/2013  Post procedure Call Back phone  # [phone]  Permission to leave phone message Yes     Patient questions:  Do you have a fever, pain , or abdominal swelling? no Pain Score  0 *  Have you tolerated food without any problems? yes  Have you been able to return to your normal activities? yes  Do you have any questions about your discharge instructions: Diet   no Medications  no Follow up visit  no  Do you have questions or concerns about your Care? no  Actions: * If pain score is 4 or above: No action needed, pain <4.

## 2013-02-26 ENCOUNTER — Encounter: Payer: Self-pay | Admitting: Internal Medicine

## 2013-03-11 ENCOUNTER — Ambulatory Visit (INDEPENDENT_AMBULATORY_CARE_PROVIDER_SITE_OTHER): Payer: Medicare Other | Admitting: Internal Medicine

## 2013-03-11 ENCOUNTER — Ambulatory Visit
Admission: RE | Admit: 2013-03-11 | Discharge: 2013-03-11 | Disposition: A | Discharge status: Home / Self Care | Payer: Medicare Other | Source: Ambulatory Visit | Attending: Internal Medicine | Admitting: Internal Medicine

## 2013-03-11 ENCOUNTER — Encounter: Payer: Self-pay | Admitting: Internal Medicine

## 2013-03-11 VITALS — BP 142/82 | HR 57 | Temp 97.9°F | Wt 140.6 lb

## 2013-03-11 DIAGNOSIS — R220 Localized swelling, mass and lump, head: Secondary | ICD-10-CM

## 2013-03-11 DIAGNOSIS — F0390 Unspecified dementia without behavioral disturbance: Secondary | ICD-10-CM

## 2013-03-11 DIAGNOSIS — R599 Enlarged lymph nodes, unspecified: Secondary | ICD-10-CM

## 2013-03-11 NOTE — Assessment & Plan Note (Signed)
Labs reviewed and MRI brain 10/2011 Progressive symptoms -  S/p eval 02/2012 by neuro - felt consistent with mild dementia, confirmed on spring 2014 neuropsyc eval>mild cognitive dysfunction started aricept 5mg  qhs 06/2012, titrate up 10/2012 when ineligible for study trial 10/2012 Added namenda 01/2013 - feels GI side effects are tolerable Encouraged to follow up neuro as planned Also continue SSRI for "pseudodementia symptoms" as early symptoms improved initially on tx for same -

## 2013-03-11 NOTE — Patient Instructions (Signed)
It was good to see you today. We have reviewed your prior records including labs and tests today Test(s) ordered today. Your results will be released to MyChart (or called to you) after review, usually within 72hours after test completion. If any changes need to be made, you will be notified at that same time. Medications reviewed and updated, no changes recommended at this time. Alternate between ibuprofen and tylenol for aches, pain and fever symptoms as discussed

## 2013-03-11 NOTE — Progress Notes (Signed)
HPI:  complains of "lump" posterior right neck Present x 2 weeks No change in size in same 2 weeks No fever, no sore throat or ear pain No weight changes or night sweats  Past Medical History  Diagnosis Date  . HERPES ZOSTER   . HEARING LOSS, BILATERAL   . OSTEOPENIA   . VITAMIN B12 DEFICIENCY   . Mild cognitive impairment with memory loss     neuropscy eval 10/2012 and follows with neuro  . ALLERGIC RHINITIS   . ANXIETY   . DEGENERATIVE JOINT DISEASE, KNEES, BILATERAL   . DYSLIPIDEMIA   . PMR (polymyalgia rheumatica) dx 10/2011    Initially dx GCA, then PMR clarified -on pred, follows with rheum  . Dementia 08/31/2010    Qualifier: Diagnosis of  By: Felicity Coyer MD, Raenette Rover Fibrocystic breast disease   . Diverticulosis   . Hemorrhoids   . Adenomatous colon polyp 05/26/1999    procedure at [facility] in [place], [place]   ROS - see HPI above  PE BP 142/82  Pulse 57  Temp(Src) 97.9 F (36.6 C) (Oral)  Wt 140 lb 9.6 oz (63.776 kg)  BMI 22.7 kg/m2  SpO2 98% Wt Readings from Last 3 Encounters:  03/11/13 140 lb 9.6 oz (63.776 kg)  02/20/13 141 lb (63.957 kg)  02/12/13 141 lb 6 oz (64.127 kg)   Constitutional: She appears well-developed and well-nourished. No distress.  HENT: Head: Normocephalic and atraumatic. Ears: B TMs ok, no erythema or effusion; Nose: Nose normal. Mouth/Throat: Oropharynx is clear and moist. No oropharyngeal exudate.  Eyes: Conjunctivae and EOM are normal. Pupils are equal, round, and reactive to light. No scleral icterus.  Neck: 12 mm round, freely mobile, LN-like density right posterior neck - no erythema or tenderness to palpation -Normal range of motion. Neck supple. No JVD present. No thyromegaly present.  Cardiovascular: Normal rate, regular rhythm and normal heart sounds.  No murmur heard. No BLE edema. Pulmonary/Chest: Effort normal and breath sounds normal. No respiratory distress. She has no wheezes.  Skin: Skin is warm and dry. No  rash noted. No erythema.  Psychiatric: She has a normal mood and affect. Her behavior is normal. Judgment and thought content normal.   Lab Results  Component Value Date   WBC 6.4 02/05/2013   HGB 13.2 02/05/2013   HCT 38.8 02/05/2013   PLT 399.0 02/05/2013   GLUCOSE 99 02/05/2013   CHOL 217* 02/05/2013   TRIG 39.0 02/05/2013   HDL 88.30 02/05/2013   LDLDIRECT 114.9 02/05/2013   LDLCALC 112* 10/31/2011   ALT 38* 10/31/2011   AST 33 10/31/2011   NA 137 02/05/2013   K 4.0 02/05/2013   CL 99 02/05/2013   CREATININE 0.7 02/05/2013   BUN 11 02/05/2013   CO2 30 02/05/2013   TSH 1.81 12/21/2012    A/P: R posterior LN enlargement - no other LAD appreciable on exam No evidence for HENT infection Check soft tissue ultrasound Advise tylenol or ibuprofen prn - follow up if unimproved - or biopsy if abnormal on [place]

## 2013-03-12 ENCOUNTER — Other Ambulatory Visit: Payer: Medicare Other

## 2013-03-12 ENCOUNTER — Ambulatory Visit: Payer: Medicare Other | Admitting: Internal Medicine

## 2013-04-09 ENCOUNTER — Encounter: Payer: Self-pay | Admitting: Internal Medicine

## 2013-04-09 ENCOUNTER — Ambulatory Visit (INDEPENDENT_AMBULATORY_CARE_PROVIDER_SITE_OTHER): Payer: Medicare Other | Admitting: Internal Medicine

## 2013-04-09 ENCOUNTER — Other Ambulatory Visit (INDEPENDENT_AMBULATORY_CARE_PROVIDER_SITE_OTHER): Payer: Medicare Other

## 2013-04-09 ENCOUNTER — Ambulatory Visit (INDEPENDENT_AMBULATORY_CARE_PROVIDER_SITE_OTHER)
Admission: RE | Admit: 2013-04-09 | Discharge: 2013-04-09 | Disposition: A | Discharge status: Home / Self Care | Payer: Medicare Other | Source: Ambulatory Visit | Attending: Internal Medicine | Admitting: Internal Medicine

## 2013-04-09 VITALS — BP 130/82 | HR 62 | Temp 97.1°F | Wt 139.0 lb

## 2013-04-09 DIAGNOSIS — Z23 Encounter for immunization: Secondary | ICD-10-CM

## 2013-04-09 DIAGNOSIS — R634 Abnormal weight loss: Secondary | ICD-10-CM

## 2013-04-09 DIAGNOSIS — R197 Diarrhea, unspecified: Secondary | ICD-10-CM

## 2013-04-09 LAB — CBC WITH DIFFERENTIAL/PLATELET
Basophils Absolute: 0.1 10*3/uL (ref 0.0–0.1)
Eosinophils Absolute: 0.2 10*3/uL (ref 0.0–0.7)
HCT: 39.4 % (ref 36.0–46.0)
Hemoglobin: 13.4 g/dL (ref 12.0–15.0)
Lymphocytes Relative: 21.3 % (ref 12.0–46.0)
Lymphs Abs: 1.5 10*3/uL (ref 0.7–4.0)
MCHC: 33.9 g/dL (ref 30.0–36.0)
Monocytes Relative: 9 % (ref 3.0–12.0)
Neutro Abs: 4.7 10*3/uL (ref 1.4–7.7)
Platelets: 360 10*3/uL (ref 150.0–400.0)
RDW: 14.7 % — ABNORMAL HIGH (ref 11.5–14.6)

## 2013-04-09 LAB — BASIC METABOLIC PANEL
BUN: 9 mg/dL (ref 6–23)
CO2: 31 mEq/L (ref 19–32)
Calcium: 9.4 mg/dL (ref 8.4–10.5)
GFR: 92.34 mL/min (ref >60.00)
Glucose, Bld: 92 mg/dL (ref 70–99)
Sodium: 136 mEq/L (ref 135–145)

## 2013-04-09 LAB — HEPATIC FUNCTION PANEL
ALT: 15 U/L (ref 0–35)
AST: 19 U/L (ref 0–37)
Bilirubin, Direct: 0.1 mg/dL (ref 0.0–0.3)
Total Bilirubin: 0.7 mg/dL (ref 0.3–1.2)

## 2013-04-09 LAB — TSH: TSH: 1.5 u[IU]/mL (ref 0.35–5.50)

## 2013-04-09 MED ORDER — IOHEXOL 300 MG/ML  SOLN: 100.0000 mL | Freq: Once | INTRAMUSCULAR | Status: AC | PRN

## 2013-04-09 NOTE — Progress Notes (Signed)
Subjective:    Patient ID: [name] Fox, female    DOB: Feb 24, 1941, 72 y.o.   MRN: [id number]  HPI Here for follow up - reviewed personal concerns, chronic medical issues and interval issues    Past Medical History  Diagnosis Date  . HERPES ZOSTER   . HEARING LOSS, BILATERAL   . OSTEOPENIA   . VITAMIN B12 DEFICIENCY   . Mild cognitive impairment with memory loss     neuropscy eval 10/2012 and follows with neuro  . ALLERGIC RHINITIS   . ANXIETY   . DEGENERATIVE JOINT DISEASE, KNEES, BILATERAL   . DYSLIPIDEMIA   . PMR (polymyalgia rheumatica) dx 10/2011    Initially dx GCA, then PMR clarified -on pred, follows with rheum  . Dementia 08/31/2010    Qualifier: Diagnosis of  By: Felicity Coyer MD, Raenette Rover Fibrocystic breast disease   . Diverticulosis   . Hemorrhoids   . Adenomatous colon polyp 05/26/1999    procedure at [facility] in [place], [place]    Review of Systems  Constitutional: Positive for appetite change (poor) and unexpected weight change. Negative for activity change and fatigue.  Respiratory: Negative for cough, shortness of breath and wheezing.   Cardiovascular: Negative for chest pain and palpitations.  Gastrointestinal: Positive for diarrhea (loose stool immediately after meals x 32mo). Negative for nausea, vomiting, abdominal pain and blood in stool.  Genitourinary: Positive for frequency.  Neurological: Negative for dizziness and headaches.       Progressive memory problems  Psychiatric/Behavioral: Negative for confusion, sleep disturbance and dysphoric mood. The patient is not nervous/anxious.        Objective:   Physical Exam BP 130/82  Pulse 62  Temp(Src) 97.1 F (36.2 C) (Oral)  Wt 139 lb (63.05 kg)  BMI 22.45 kg/m2  SpO2 96% Wt Readings from Last 3 Encounters:  04/09/13 139 lb (63.05 kg)  03/11/13 140 lb 9.6 oz (63.776 kg)  02/20/13 141 lb (63.957 kg)   Constitutional: She appears well-developed and well-nourished. No distress.   Cardiovascular: Normal rate, regular rhythm and normal heart sounds.  No murmur heard. No BLE edema. Pulmonary/Chest: Effort normal and breath sounds normal bilaterally. No respiratory distress. She has no wheezes.  Abdomen: Soft, nontender, nondistended. Positive bowel sounds. No masses Psychiatric: She has a normal mood and affect. Her behavior is normal. Good insight. Judgment and thought content normal.      Lab Results  Component Value Date   WBC 6.4 02/05/2013   HGB 13.2 02/05/2013   HCT 38.8 02/05/2013   PLT 399.0 02/05/2013   CHOL 217* 02/05/2013   TRIG 39.0 02/05/2013   HDL 88.30 02/05/2013   LDLDIRECT 114.9 02/05/2013   ALT 38* 10/31/2011   AST 33 10/31/2011   NA 137 02/05/2013   K 4.0 02/05/2013   CL 99 02/05/2013   CREATININE 0.7 02/05/2013   BUN 11 02/05/2013   CO2 30 02/05/2013   TSH 1.81 12/21/2012   Lab Results  Component Value Date   VITAMINB12 220 08/31/2010   Lab Results  Component Value Date   ESRSEDRATE 70* 06/06/2012   Lab Results  Component Value Date   ANA NEG 06/06/2012    Assessment & Plan:    Weight loss ,unintentional - associated with anorexia and loose bowel (change) reviewed No other systemic symptoms   Normal mammo 10/2012 Colo 02/2013 for diarrhea: benign polyp, no colitis on bx  Recheck labs Check ct a/p recommended to keep food journal follow up 6  weeks

## 2013-04-09 NOTE — Patient Instructions (Signed)
It was good to see you today. We have reviewed your prior records including labs and tests today Test(s) ordered today. Your results will be released to MyChart (or called to you) after review, usually within 72hours after test completion. If any changes need to be made, you will be notified at that same time. Medications reviewed and updated -no changes recommended today we'll make referral for CT scan to look for cancer. Our office will contact you regarding appointment(s) once made. Please schedule followup in 6 weeks for review, call sooner if problems.

## 2013-04-10 ENCOUNTER — Other Ambulatory Visit: Payer: Self-pay | Admitting: Dermatology

## 2013-04-11 ENCOUNTER — Other Ambulatory Visit: Payer: Medicare Other

## 2013-04-12 ENCOUNTER — Encounter: Payer: Self-pay | Admitting: Internal Medicine

## 2013-04-12 ENCOUNTER — Ambulatory Visit (INDEPENDENT_AMBULATORY_CARE_PROVIDER_SITE_OTHER): Payer: Medicare Other | Admitting: Internal Medicine

## 2013-04-12 VITALS — BP 132/86 | HR 73 | Temp 98.1°F | Wt 139.8 lb

## 2013-04-12 DIAGNOSIS — R634 Abnormal weight loss: Secondary | ICD-10-CM

## 2013-04-12 DIAGNOSIS — M353 Polymyalgia rheumatica: Secondary | ICD-10-CM

## 2013-04-12 DIAGNOSIS — F0390 Unspecified dementia without behavioral disturbance: Secondary | ICD-10-CM

## 2013-04-12 NOTE — Progress Notes (Signed)
Subjective:    Patient ID: [name] Fox, female    DOB: [birth year], 72 y.o.   MRN: [id number]  HPI Here for follow up - reviewed personal concerns, chronic medical issues and interval issues    Past Medical History  Diagnosis Date  . HERPES ZOSTER   . HEARING LOSS, BILATERAL   . OSTEOPENIA   . VITAMIN B12 DEFICIENCY   . Mild cognitive impairment with memory loss     neuropscy eval 10/2012 and follows with neuro  . ALLERGIC RHINITIS   . ANXIETY   . DEGENERATIVE JOINT DISEASE, KNEES, BILATERAL   . DYSLIPIDEMIA   . PMR (polymyalgia rheumatica) dx 10/2011    Initially dx GCA, then PMR clarified -on pred, follows with rheum  . Dementia 08/31/2010    Qualifier: Diagnosis of  By: Felicity Coyer MD, Raenette Rover Fibrocystic breast disease   . Diverticulosis   . Hemorrhoids   . Adenomatous colon polyp 05/26/1999    procedure at [facility] in [place], [place]    Review of Systems  Constitutional: Positive for appetite change (poor) and unexpected weight change. Negative for activity change and fatigue.  Respiratory: Negative for cough, shortness of breath and wheezing.   Cardiovascular: Negative for chest pain and palpitations.  Gastrointestinal: Positive for diarrhea (loose stool immediately after meals x 4mo). Negative for nausea, vomiting, abdominal pain and blood in stool.  Genitourinary: Positive for frequency.  Neurological: Negative for dizziness and headaches.       Progressive memory problems  Psychiatric/Behavioral: Negative for confusion, sleep disturbance and dysphoric mood. The patient is not nervous/anxious.        Objective:   Physical Exam BP 132/86  Pulse 73  Temp(Src) 98.1 F (36.7 C) (Oral)  Wt 139 lb 12.8 oz (63.413 kg)  BMI 22.58 kg/m2  SpO2 95% Wt Readings from Last 3 Encounters:  04/12/13 139 lb 12.8 oz (63.413 kg)  04/09/13 139 lb (63.05 kg)  03/11/13 140 lb 9.6 oz (63.776 kg)   Constitutional: She appears well-developed and well-nourished. No  distress.  Cardiovascular: Normal rate, regular rhythm and normal heart sounds.  No murmur heard. No BLE edema. Pulmonary/Chest: Effort normal and breath sounds normal bilaterally. No respiratory distress. She has no wheezes.  Abdomen: Soft, nontender, nondistended. Positive bowel sounds. No masses Psychiatric: She has a normal mood and affect. Her behavior is normal. Good insight. Judgment and thought content normal.      Lab Results  Component Value Date   WBC 7.1 04/09/2013   HGB 13.4 04/09/2013   HCT 39.4 04/09/2013   PLT 360.0 04/09/2013   CHOL 217* 02/05/2013   TRIG 39.0 02/05/2013   HDL 88.30 02/05/2013   LDLDIRECT 114.9 02/05/2013   ALT 15 04/09/2013   AST 19 04/09/2013   NA 136 04/09/2013   K 3.9 04/09/2013   CL 99 04/09/2013   CREATININE 0.7 04/09/2013   BUN 9 04/09/2013   CO2 31 04/09/2013   TSH 1.50 04/09/2013   Lab Results  Component Value Date   VITAMINB12 220 08/31/2010   Lab Results  Component Value Date   ESRSEDRATE 70* 06/06/2012   Lab Results  Component Value Date   ANA NEG 06/06/2012   Ct Abdomen Pelvis W Contrast  04/09/2013   CLINICAL DATA:  Abnormal unintentional weight loss. Diarrhea. Family history of pancreatic carcinoma.  EXAM: CT ABDOMEN AND PELVIS WITH CONTRAST  TECHNIQUE: Multidetector CT imaging of the abdomen and pelvis was performed using the standard protocol following bolus  administration of intravenous contrast.  CONTRAST:  100 cc Omnipaque 300 and oral contrast  COMPARISON:  None.  FINDINGS: The liver, gallbladder, pancreas, spleen, adrenal glands, and both kidneys are normal in appearance. No evidence of hydronephrosis.  Uterus and adnexal regions are unremarkable. No soft tissue masses or lymphadenopathy identified within the abdomen or pelvis. No evidence of inflammatory process or abnormal fluid collections. No evidence of dilated bowel loops or hernia. Mild diverticulosis is seen involving the left colon, however there is no evidence of diverticulitis.   IMPRESSION: No acute findings.  Diverticulosis. No radiographic evidence of diverticulitis.   Electronically Signed   By: Myles Rosenthal   On: 04/09/2013 15:45   Assessment & Plan:   See problem list. Medications and labs reviewed today.  Time spent with pt today 25 minutes, greater than 50% time spent counseling patient on weight loss, memory concerns and medication review. Also review of prior records

## 2013-04-12 NOTE — Assessment & Plan Note (Signed)
Labs reviewed and MRI brain 10/2011 Progressive symptoms -  S/p eval 02/2012 by neuro - felt consistent with mild dementia, confirmed on spring 2014 neuropsyc eval>mild cognitive dysfunction started aricept 5mg qhs 06/2012, titrate up 10/2012 when ineligible for study trial 10/2012 Added namenda 01/2013 - feels GI side effects are tolerable Encouraged to follow up neuro as planned Also continue SSRI for "pseudodementia symptoms" as early symptoms improved initially on tx for same - 

## 2013-04-12 NOTE — Patient Instructions (Signed)
It was good to see you today. We have reviewed your prior records including labs and tests today Medications reviewed and updated -no changes recommended today Please schedule followup in 6 weeks for review, call sooner if problems.

## 2013-04-12 NOTE — Assessment & Plan Note (Signed)
Weight loss ,unintentional: 150s in 2012, upto 165# on high dose pred 2013, now <140#  associated with anorexia and loose bowel (change) reviewed No other systemic symptoms  Normal mammo 10/2012 Colo 02/2013 for diarrhea: benign polyp, no colitis on bx unrearkable labs 03/2013 CT a/p 04/09/2013 unremarkable  recommended to keep food journal and use protein supplement follow up 6 weeks for weight check

## 2013-04-12 NOTE — Assessment & Plan Note (Signed)
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 in setting of  headache and "swelling" B temples - declined bx at advice of rheum 10/2011 [name] Fox started high dose pred 10/2011 with improvement of symptoms and sed rate decrease from 103 to 14 on 11/16/11 at [facility] (esr 19 and CRP 1.3 on 01/25/12) - Trial off pred early 12/2011 with rapid recurrence of symptoms resumed pred 40 mg/d 12/2011 thru 02/2012 when stopped pred again-  S/p 2nd local rheum eval on same because of rising ESR - now with [facility] Claris P[facility]) since 05/2012  Feels dx more consistent with PMR than GCA Reviewed MRI/MRA brain 10/2011 - no vasculitis findings Tapering off pred, down to 5g/d symptoms controlled at present - no [facility] eval planned The current medical regimen is effective;  continue present plan and medications.   Lab Results  Component Value Date   ESRSEDRATE 103* 10/31/2011   Lab Results  Component Value Date   ESRSEDRATE 70* 06/06/2012

## 2013-04-17 ENCOUNTER — Telehealth: Payer: Self-pay | Admitting: Internal Medicine

## 2013-04-17 DIAGNOSIS — H9193 Unspecified hearing loss, bilateral: Secondary | ICD-10-CM

## 2013-04-17 NOTE — Telephone Encounter (Signed)
Pt request referral for audiology, Dr. Enid Derry phone number is [phone]. Please advise. Pt is aware Dr. Felicity Coyer is out of the office.

## 2013-04-17 NOTE — Telephone Encounter (Signed)
Referral done

## 2013-05-21 ENCOUNTER — Ambulatory Visit (INDEPENDENT_AMBULATORY_CARE_PROVIDER_SITE_OTHER): Payer: Medicare Other | Admitting: Internal Medicine

## 2013-05-21 ENCOUNTER — Encounter: Payer: Self-pay | Admitting: Internal Medicine

## 2013-05-21 VITALS — BP 148/98 | HR 68 | Temp 97.5°F | Wt 138.8 lb

## 2013-05-21 DIAGNOSIS — F0390 Unspecified dementia without behavioral disturbance: Secondary | ICD-10-CM

## 2013-05-21 DIAGNOSIS — M353 Polymyalgia rheumatica: Secondary | ICD-10-CM

## 2013-05-21 DIAGNOSIS — R634 Abnormal weight loss: Secondary | ICD-10-CM

## 2013-05-21 MED ORDER — MEMANTINE HCL ER 28 MG PO CP24: 1.0000 | ORAL_CAPSULE | Freq: Every day | ORAL | Status: DC

## 2013-05-21 NOTE — Assessment & Plan Note (Signed)
Labs reviewed and MRI brain 10/2011 Progressive symptoms -  S/p eval 02/2012 by neuro - felt consistent with mild dementia, confirmed on spring 2014 neuropsyc eval>mild cognitive dysfunction started aricept 5mg  qhs 06/2012, titrated up 10/2012 when ineligible for study trial 10/2012 Added namenda 01/2013, off same since 04/2013 due to national shortage on XR - feels GI side effects were contributing to weight loss (see above) Plans to resume when supply in stock and will watch for recurrent symptoms  Encouraged to follow up neuro as planned Also continue SSRI for "pseudodementia symptoms" as early symptoms improved initially on tx for same -

## 2013-05-21 NOTE — Assessment & Plan Note (Signed)
Weight loss ,unintentional: stabilized since 03/2013 150s in 2012, upto 165# on high dose pred 2013, now <140#   Previously associated with anorexia and loose bowel (change), now normalized off Namenda  No other systemic symptoms  Normal mammo 10/2012 Colo 02/2013 for diarrhea: benign polyp, no colitis on bx unrearkable labs 03/2013 CT a/p 04/09/2013 unremarkable  recommended to continue food journal and use protein supplement as ongoing

## 2013-05-21 NOTE — Patient Instructions (Signed)
It was good to see you today.  We have reviewed your prior records including labs and tests today  Medications reviewed and updated -no changes recommended today - printed prescription for Namenda XR given to you - watch for recurrent side effects once you resume this medication and call us if problems  Please schedule followup in 3-4 month for review, call sooner if problems.

## 2013-05-21 NOTE — Progress Notes (Signed)
Subjective:    Patient ID: [name] Fox, female    DOB: 1940/12/06, 72 y.o.   MRN: [id number]  HPI Here for follow up - reviewed personal concerns, chronic medical issues and interval issues    Past Medical History  Diagnosis Date  . HERPES ZOSTER   . HEARING LOSS, BILATERAL   . OSTEOPENIA   . VITAMIN B12 DEFICIENCY   . Mild cognitive impairment with memory loss     neuropscy eval 10/2012 and follows with neuro  . ALLERGIC RHINITIS   . ANXIETY   . DEGENERATIVE JOINT DISEASE, KNEES, BILATERAL   . DYSLIPIDEMIA   . PMR (polymyalgia rheumatica) dx 10/2011    Initially dx GCA, then PMR clarified -on pred, follows with rheum  . Dementia 08/31/2010    Qualifier: Diagnosis of  By: Felicity Coyer MD, Raenette Rover Fibrocystic breast disease   . Diverticulosis   . Hemorrhoids   . Adenomatous colon polyp 05/26/1999    procedure at [facility] in [place], [place]    Review of Systems  Constitutional: Negative for activity change, appetite change, fatigue and unexpected weight change (stabilized, no further loss).  Respiratory: Negative for cough, shortness of breath and wheezing.   Cardiovascular: Negative for chest pain and palpitations.  Gastrointestinal: Negative for nausea, vomiting, abdominal pain, diarrhea (resolved in past 6 weeks) and blood in stool.  Neurological: Negative for dizziness and headaches.       Progressive memory problems  Psychiatric/Behavioral: Negative for confusion, sleep disturbance and dysphoric mood. The patient is not nervous/anxious.        Objective:   Physical Exam BP 148/98  Pulse 68  Temp(Src) 97.5 F (36.4 C) (Oral)  Wt 138 lb 12.8 oz (62.959 kg)  SpO2 95% Wt Readings from Last 3 Encounters:  05/21/13 138 lb 12.8 oz (62.959 kg)  04/12/13 139 lb 12.8 oz (63.413 kg)  04/09/13 139 lb (63.05 kg)   Constitutional: She appears well-developed and well-nourished. No distress.  Cardiovascular: Normal rate, regular rhythm and normal heart sounds.   No murmur heard. No BLE edema. Pulmonary/Chest: Effort normal and breath sounds normal bilaterally. No respiratory distress. She has no wheezes.  Abdomen: Soft, nontender, nondistended. Positive bowel sounds. No masses Psychiatric: She has a normal mood and affect. Her behavior is normal. Good insight. Judgment and thought content normal.      Lab Results  Component Value Date   WBC 7.1 04/09/2013   HGB 13.4 04/09/2013   HCT 39.4 04/09/2013   PLT 360.0 04/09/2013   CHOL 217* 02/05/2013   TRIG 39.0 02/05/2013   HDL 88.30 02/05/2013   LDLDIRECT 114.9 02/05/2013   ALT 15 04/09/2013   AST 19 04/09/2013   NA 136 04/09/2013   K 3.9 04/09/2013   CL 99 04/09/2013   CREATININE 0.7 04/09/2013   BUN 9 04/09/2013   CO2 31 04/09/2013   TSH 1.50 04/09/2013   Lab Results  Component Value Date   VITAMINB12 220 08/31/2010   Lab Results  Component Value Date   ESRSEDRATE 70* 06/06/2012   Lab Results  Component Value Date   ANA NEG 06/06/2012   Ct Abdomen Pelvis W Contrast  04/09/2013   CLINICAL DATA:  Abnormal unintentional weight loss. Diarrhea. Family history of pancreatic carcinoma.  EXAM: CT ABDOMEN AND PELVIS WITH CONTRAST  TECHNIQUE: Multidetector CT imaging of the abdomen and pelvis was performed using the standard protocol following bolus administration of intravenous contrast.  CONTRAST:  100 cc Omnipaque 300 and oral  contrast  COMPARISON:  None.  FINDINGS: The liver, gallbladder, pancreas, spleen, adrenal glands, and both kidneys are normal in appearance. No evidence of hydronephrosis.  Uterus and adnexal regions are unremarkable. No soft tissue masses or lymphadenopathy identified within the abdomen or pelvis. No evidence of inflammatory process or abnormal fluid collections. No evidence of dilated bowel loops or hernia. Mild diverticulosis is seen involving the left colon, however there is no evidence of diverticulitis.  IMPRESSION: No acute findings.  Diverticulosis. No radiographic evidence of  diverticulitis.   Electronically Signed   By: Myles Rosenthal   On: 04/09/2013 15:45   Assessment & Plan:   See problem list. Medications and labs reviewed today.  Time spent with pt today 25 minutes, greater than 50% time spent counseling patient on prior weight loss, memory concerns and medication review. Also review of prior records

## 2013-05-21 NOTE — Progress Notes (Signed)
Pre-visit discussion using our clinic review tool. No additional management support is needed unless otherwise documented below in the visit note.  

## 2013-05-21 NOTE — Assessment & Plan Note (Signed)
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 in setting of  headache and "swelling" B temples - declined bx at advice of rheum 10/2011 Anderson started high dose pred 10/2011 with improvement of symptoms and sed rate decrease from 103 to 14 on 11/16/11 at [facility] (esr 19 and CRP 1.3 on 01/25/12) - Trial off pred early 12/2011 with rapid recurrence of symptoms resumed pred 40 mg/d 12/2011 thru 02/2012 when stopped pred again-  S/p 2nd local rheum eval on same because of rising ESR - now with Tr[facility]low (prev [facility]) since 05/2012  Feels dx more consistent with PMR than GCA Reviewed MRI/MRA brain 10/2011 - no vasculitis findings Tapering off pred, down to 5mg qod since 04/2013 OV with Truslow symptoms controlled at present - no [facility] eval planned The current medical regimen is effective;  continue present plan and medications.   Lab Results  Component Value Date   ESRSEDRATE 103* 10/31/2011   Lab Results  Component Value Date   ESRSEDRATE 70* 06/06/2012    

## 2013-06-06 ENCOUNTER — Other Ambulatory Visit: Payer: Self-pay | Admitting: Internal Medicine

## 2013-06-18 ENCOUNTER — Other Ambulatory Visit: Payer: Self-pay | Admitting: *Deleted

## 2013-06-18 MED ORDER — SERTRALINE HCL 100 MG PO TABS: 100.0000 mg | ORAL_TABLET | Freq: Every day | ORAL | Status: DC

## 2013-07-16 ENCOUNTER — Other Ambulatory Visit: Payer: Self-pay | Admitting: Internal Medicine

## 2013-08-07 ENCOUNTER — Ambulatory Visit (INDEPENDENT_AMBULATORY_CARE_PROVIDER_SITE_OTHER): Payer: Medicare Other | Admitting: Internal Medicine

## 2013-08-07 ENCOUNTER — Encounter: Payer: Self-pay | Admitting: Internal Medicine

## 2013-08-07 VITALS — BP 130/82 | HR 67 | Temp 97.0°F | Wt 146.8 lb

## 2013-08-07 DIAGNOSIS — F32A Depression, unspecified: Secondary | ICD-10-CM

## 2013-08-07 DIAGNOSIS — M899 Disorder of bone, unspecified: Secondary | ICD-10-CM

## 2013-08-07 DIAGNOSIS — F3289 Other specified depressive episodes: Secondary | ICD-10-CM

## 2013-08-07 DIAGNOSIS — M353 Polymyalgia rheumatica: Secondary | ICD-10-CM

## 2013-08-07 DIAGNOSIS — F329 Major depressive disorder, single episode, unspecified: Secondary | ICD-10-CM

## 2013-08-07 DIAGNOSIS — F0390 Unspecified dementia without behavioral disturbance: Secondary | ICD-10-CM

## 2013-08-07 DIAGNOSIS — M949 Disorder of cartilage, unspecified: Principal | ICD-10-CM

## 2013-08-07 MED ORDER — ALENDRONATE SODIUM 70 MG PO TABS: 70.0000 mg | ORAL_TABLET | ORAL | Status: DC

## 2013-08-07 NOTE — Patient Instructions (Signed)
It was good to see you today.  We have reviewed your prior records including labs and tests today  we'll make referral for bone density follow up . Our office will contact you regarding appointment(s) once made.  Please schedule followup in 6 month for review/labs (annual wellness), call sooner if problems.

## 2013-08-07 NOTE — Assessment & Plan Note (Signed)
Overlap with memory dysfunction - Also exacerbated by marital stress - intermittently considering separation -  spouse left 03/2012, but has since returned On low dose SSRI for same since 08/2010 - increased dose 04/10/12 - Increasing symptoms with formal dx "early dementia" -maximized dose 10/2012 s/p personal behav health counseling with [facility] ([facility]) and s/p Trey Paula eval x 2 Also neuro and neuropsyc spring 2014 - currently working with support from [employer]

## 2013-08-07 NOTE — Assessment & Plan Note (Signed)
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 in setting of  headache and "swelling" B temples - declined bx at advice of rheum 10/2011 [name] Fox started high dose pred 10/2011 with improvement of symptoms and sed rate decrease from 103 to 14 on 11/16/11 at [facility] (esr 19 and CRP 1.3 on 01/25/12) - Trial off pred early 12/2011 with rapid recurrence of symptoms resumed pred 40 mg/d 12/2011 thru 02/2012 when stopped pred again-  S/p 2nd local rheum eval on same because of rising ESR - now with Truslow Pauletta Browns [facility]) since 05/2012  Feels dx more consistent with PMR than GCA Reviewed MRI/MRA brain 10/2011 - no vasculitis findings Tapering off pred, down to 56m qod since 04/2013 OV with Tr[facility]low symptoms controlled at present - no [facility] eval planned The current medical regimen is effective;  continue present plan and medications.   Lab Results  Component Value Date   ESRSEDRATE 103* 10/31/2011   Lab Results  Component Value Date   ESRSEDRATE 70* 06/06/2012

## 2013-08-07 NOTE — Assessment & Plan Note (Signed)
Labs reviewed and MRI brain 10/2011 Progressive symptoms -  S/p eval 02/2012 by neuro - felt consistent with mild dementia, confirmed on spring 2014 neuropsyc eval>mild cognitive dysfunction started aricept 5mg qhs 06/2012, titrated up 10/2012 when ineligible for study trial 10/2012 Added namenda 01/2013, off same since 04/2013 due to national shortage on XR - feels GI side effects were contributing to weight loss (see above) Encouraged to follow up neuro as needed Also continue SSRI for "pseudodementia symptoms" as early symptoms improved initially on tx for same  

## 2013-08-07 NOTE — Progress Notes (Signed)
Pre-visit discussion using our clinic review tool. No additional management support is needed unless otherwise documented below in the visit note.  

## 2013-08-07 NOTE — Assessment & Plan Note (Signed)
DEXA @ [facility] 08/2009: -1.7 On Fosamax therapy in addition to calcium, vitamin D and weightbearing exercises Recheck DEXA now, adjust therapy as needed 

## 2013-08-07 NOTE — Progress Notes (Signed)
  Subjective:    Patient ID: [name] Fox, female    DOB: 1940/10/26, 73 y.o.   MRN: [id number]  HPI Here for follow up - reviewed personal concerns, chronic medical issues and interval issues    Past Medical History  Diagnosis Date  . HERPES ZOSTER   . HEARING LOSS, BILATERAL   . OSTEOPENIA   . VITAMIN B12 DEFICIENCY   . Mild cognitive impairment with memory loss     neuropscy eval 10/2012 and follows with neuro  . ALLERGIC RHINITIS   . ANXIETY   . DEGENERATIVE JOINT DISEASE, KNEES, BILATERAL   . DYSLIPIDEMIA   . PMR (polymyalgia rheumatica) dx 10/2011    Initially dx GCA, then PMR clarified -on pred, follows with rheum  . Dementia 08/31/2010    Qualifier: Diagnosis of  By: Asa Lente MD, Jannifer Rodney Fibrocystic breast disease   . Diverticulosis   . Hemorrhoids   . Adenomatous colon polyp 05/26/1999    procedure at [facility] in [place], [place]    Review of Systems  Constitutional: Negative for activity change, appetite change, fatigue and unexpected weight change (stabilized, no further loss).  Respiratory: Negative for cough, shortness of breath and wheezing.   Cardiovascular: Negative for chest pain and palpitations.  Gastrointestinal: Negative for nausea, vomiting, abdominal pain, diarrhea and blood in stool.  Neurological: Negative for dizziness and headaches.       Stable, chronic memory problems  Psychiatric/Behavioral: Negative for confusion, sleep disturbance and dysphoric mood. The patient is not nervous/anxious.        Objective:   Physical Exam BP 130/82  Pulse 67  Temp(Src) 97 F (36.1 C) (Oral)  Wt 146 lb 12.8 oz (66.588 kg)  SpO2 96% Wt Readings from Last 3 Encounters:  08/07/13 146 lb 12.8 oz (66.588 kg)  05/21/13 138 lb 12.8 oz (62.959 kg)  04/12/13 139 lb 12.8 oz (63.413 kg)   Constitutional: She appears well-developed and well-nourished. No distress.  Cardiovascular: Normal rate, regular rhythm and normal heart sounds.  No murmur heard.  No BLE edema. Pulmonary/Chest: Effort normal and breath sounds normal bilaterally. No respiratory distress. She has no wheezes. Psychiatric: She has a normal mood and affect. Her behavior is normal. Good insight. Judgment and thought content normal.      Lab Results  Component Value Date   WBC 7.1 04/09/2013   HGB 13.4 04/09/2013   HCT 39.4 04/09/2013   PLT 360.0 04/09/2013   CHOL 217* 02/05/2013   TRIG 39.0 02/05/2013   HDL 88.30 02/05/2013   LDLDIRECT 114.9 02/05/2013   ALT 15 04/09/2013   AST 19 04/09/2013   NA 136 04/09/2013   K 3.9 04/09/2013   CL 99 04/09/2013   CREATININE 0.7 04/09/2013   BUN 9 04/09/2013   CO2 31 04/09/2013   TSH 1.50 04/09/2013   Lab Results  Component Value Date   VITAMINB12 220 08/31/2010   Lab Results  Component Value Date   ESRSEDRATE 70* 06/06/2012   Lab Results  Component Value Date   ANA NEG 06/06/2012    Assessment & Plan:   See problem list. Medications and labs reviewed today.  Time spent with pt today 25 minutes, greater than 50% time spent counseling patient on prior weight loss, memory concerns and medication review. Also review of prior records

## 2013-08-08 ENCOUNTER — Ambulatory Visit: Payer: Medicare Other | Admitting: Internal Medicine

## 2013-09-09 ENCOUNTER — Other Ambulatory Visit: Payer: Self-pay | Admitting: Internal Medicine

## 2013-09-23 ENCOUNTER — Encounter: Payer: Self-pay | Admitting: Internal Medicine

## 2013-09-23 ENCOUNTER — Ambulatory Visit (INDEPENDENT_AMBULATORY_CARE_PROVIDER_SITE_OTHER): Payer: Medicare Other | Admitting: Internal Medicine

## 2013-09-23 VITALS — BP 120/72 | HR 72 | Temp 97.0°F | Wt 149.1 lb

## 2013-09-23 DIAGNOSIS — F3289 Other specified depressive episodes: Secondary | ICD-10-CM

## 2013-09-23 DIAGNOSIS — F329 Major depressive disorder, single episode, unspecified: Secondary | ICD-10-CM

## 2013-09-23 DIAGNOSIS — F411 Generalized anxiety disorder: Secondary | ICD-10-CM

## 2013-09-23 DIAGNOSIS — F32A Depression, unspecified: Secondary | ICD-10-CM

## 2013-09-23 DIAGNOSIS — F0390 Unspecified dementia without behavioral disturbance: Secondary | ICD-10-CM

## 2013-09-23 DIAGNOSIS — F418 Other specified anxiety disorders: Secondary | ICD-10-CM

## 2013-09-23 MED ORDER — ALPRAZOLAM 0.5 MG PO TABS: 0.2500 mg | ORAL_TABLET | Freq: Four times a day (QID) | ORAL | Status: DC | PRN

## 2013-09-23 MED ORDER — PRAVASTATIN SODIUM 20 MG PO TABS: ORAL_TABLET | ORAL | Status: DC

## 2013-09-23 NOTE — Progress Notes (Signed)
Pre-visit discussion using our clinic review tool. No additional management support is needed unless otherwise documented below in the visit note.  

## 2013-09-23 NOTE — Progress Notes (Signed)
Subjective:    Patient ID: [name] Fox, female    DOB: 06-11-1941, 73 y.o.   MRN: [id number]  HPI  Patient is here for "stress" Reviewed chronic medical issues and interval medical events  Past Medical History  Diagnosis Date  . HERPES ZOSTER   . HEARING LOSS, BILATERAL   . OSTEOPENIA   . VITAMIN B12 DEFICIENCY   . Mild cognitive impairment with memory loss     neuropscy eval 10/2012 and follows with neuro  . ALLERGIC RHINITIS   . ANXIETY   . DEGENERATIVE JOINT DISEASE, KNEES, BILATERAL   . DYSLIPIDEMIA   . PMR (polymyalgia rheumatica) dx 10/2011    Initially dx GCA, then PMR clarified -on pred, follows with rheum  . Dementia 08/31/2010    Qualifier: Diagnosis of  By: Asa Lente MD, Jannifer Rodney Fibrocystic breast disease   . Diverticulosis   . Hemorrhoids   . Adenomatous colon polyp 05/26/1999    procedure at [facility] in [place], [place]    Review of Systems  Neurological: Negative for weakness.  Psychiatric/Behavioral: Positive for sleep disturbance, dysphoric mood and decreased concentration. Negative for suicidal ideas, hallucinations, confusion and self-injury. The patient is nervous/anxious. The patient is not hyperactive.        Objective:   Physical Exam  BP 120/72  Pulse 72  Temp(Src) 97 F (36.1 C) (Oral)  Wt 149 lb 1.9 oz (67.64 kg)  SpO2 97% Wt Readings from Last 3 Encounters:  09/23/13 149 lb 1.9 oz (67.64 kg)  08/07/13 146 lb 12.8 oz (66.588 kg)  05/21/13 138 lb 12.8 oz (62.959 kg)    Constitutional: She appears well-developed and well-nourished. No distress.  Neck: Normal range of motion. Neck supple. No JVD present. No thyromegaly present.  Cardiovascular: Normal rate, regular rhythm and normal heart sounds.  No murmur heard. No BLE edema. Pulmonary/Chest: Effort normal and breath sounds normal. No respiratory distress. She has no wheezes.  Psychiatric: She has a normal mood and affect. Her behavior is normal. Judgment and thought  content normal.   Lab Results  Component Value Date   WBC 7.1 04/09/2013   HGB 13.4 04/09/2013   HCT 39.4 04/09/2013   PLT 360.0 04/09/2013   GLUCOSE 92 04/09/2013   CHOL 217* 02/05/2013   TRIG 39.0 02/05/2013   HDL 88.30 02/05/2013   LDLDIRECT 114.9 02/05/2013   LDLCALC 112* 10/31/2011   ALT 15 04/09/2013   AST 19 04/09/2013   NA 136 04/09/2013   K 3.9 04/09/2013   CL 99 04/09/2013   CREATININE 0.7 04/09/2013   BUN 9 04/09/2013   CO2 31 04/09/2013   TSH 1.50 04/09/2013    Ct Abdomen Pelvis W Contrast  04/09/2013   CLINICAL DATA:  Abnormal unintentional weight loss. Diarrhea. Family history of pancreatic carcinoma.  EXAM: CT ABDOMEN AND PELVIS WITH CONTRAST  TECHNIQUE: Multidetector CT imaging of the abdomen and pelvis was performed using the standard protocol following bolus administration of intravenous contrast.  CONTRAST:  100 cc Omnipaque 300 and oral contrast  COMPARISON:  None.  FINDINGS: The liver, gallbladder, pancreas, spleen, adrenal glands, and both kidneys are normal in appearance. No evidence of hydronephrosis.  Uterus and adnexal regions are unremarkable. No soft tissue masses or lymphadenopathy identified within the abdomen or pelvis. No evidence of inflammatory process or abnormal fluid collections. No evidence of dilated bowel loops or hernia. Mild diverticulosis is seen involving the left colon, however there is no evidence of diverticulitis.  IMPRESSION:  No acute findings.  Diverticulosis. No radiographic evidence of diverticulitis.   Electronically Signed   By: Earle Gell   On: 04/09/2013 15:45       Assessment & Plan:   Situational anxiety. Currently precipitated by pending announcement divorced her spouse. Situation reviewed in depth. Support offered. We'll prescribe Xanax as needed for anxiety and sleep. Continue max dose or 2 as ongoing. Continue counseling as ongoing. Patient denies SI/HI. Patient agrees to call if symptoms unimproved, sooner if worse  Problem List Items  Addressed This Visit   Dementia     Labs reviewed and MRI brain 10/2011 Progressive symptoms -  S/p eval 02/2012 by neuro - felt consistent with mild dementia, confirmed on spring 2014 neuropsyc eval>mild cognitive dysfunction started aricept 5mg  qhs 06/2012, titrated up 10/2012 when ineligible for study trial 10/2012 Added namenda 01/2013, off same since 04/2013 due to national shortage on XR - feels GI side effects were contributing to weight loss (see above) Encouraged to follow up neuro as needed Also continue SSRI for "pseudodementia symptoms" as early symptoms improved initially on tx for same     Relevant Medications      ALPRAZolam (XANAX) tablet   Depression     Overlap with memory dysfunction - Also exacerbated by marital stress - intermittently considering separation for years, spouse announced desire for divorce 09/2013 -  Hx: spouse left 03/2012, but has since returned On low dose SSRI for same since 08/2010 - increased dose 04/10/12 - Increasing symptoms with formal dx "early dementia" -maximized dose 10/2012 s/p personal behav health counseling with [facility] ([facility]) and s/p Trey Paula eval x 2 Also neuro and neuropsyc spring 2014 - currently working with support from [employer] Will add xanax prn sleep and panic - rx done     Other Visit Diagnoses   Situational anxiety    -  Primary

## 2013-09-23 NOTE — Assessment & Plan Note (Addendum)
Labs reviewed and MRI brain 10/2011 Progressive symptoms -  S/p eval 02/2012 by neuro - felt consistent with mild dementia, confirmed on spring 2014 neuropsyc eval>mild cognitive dysfunction started aricept 5mg  qhs 06/2012, titrated up 10/2012 when ineligible for study trial 10/2012 Added namenda 01/2013, off same since 04/2013 due to national shortage on XR - feels GI side effects were contributing to weight loss (see above) Encouraged to follow up neuro as needed Also continue SSRI for "pseudodementia symptoms" as early symptoms improved initially on tx for same

## 2013-09-23 NOTE — Assessment & Plan Note (Signed)
Overlap with memory dysfunction - Also exacerbated by marital stress - intermittently considering separation for years, spouse announced desire for divorce 09/2013 -  Hx: spouse left 03/2012, but has since returned On low dose SSRI for same since 08/2010 - increased dose 04/10/12 - Increasing symptoms with formal dx "early dementia" -maximized dose 10/2012 s/p personal behav health counseling with [facility] ([facility]) and s/p Trey Paula eval x 2 Also neuro and neuropsyc spring 2014 - currently working with support from [employer] Will add xanax prn sleep and panic - rx done

## 2013-09-23 NOTE — Patient Instructions (Signed)
It was good to see you today.  We have reviewed your prior records including labs and tests today  Use xanax as needed - Your prescription(s) have been submitted to your pharmacy. Please take as directed and contact our office if you believe you are having problem(s) with the medication(s).  No other prescription changes recommended  Please call if referral for counseling is needed, please call sooner if symptoms unimproved or worse

## 2013-11-11 ENCOUNTER — Telehealth: Payer: Self-pay | Admitting: Internal Medicine

## 2013-11-11 NOTE — Telephone Encounter (Signed)
Patient Information:  Caller Name: Rozelle  Phone: [phone]  Patient: [name] Fox, [name] Fox  Gender: Female  DOB: 12-21-1940  Age: 73 Years  PCP: Gwendolyn Grant (Adults only)  Office Follow Up:  Does the office need to follow up with this patient?: Yes  Instructions For The Office: Patient would like Dr. Asa Lente to call her back personally krs/can  RN Note:  Patient calling emergent line about elevated BP and tingling in right hand.  Went to routine eye exam 11/11/13 and was noted to have BP 173/90.  States is under high stress/husband has asked for a divorce.  Onset of tingling in right hand noted 11/11/13 but attributed that to planting her garden this past weekend.  Per elevated BP protocol, emergent symptoms currently denied; offered appt in office.  Patient declines; "would like Dr. Katheren Puller advice about this."  "Requesting callback from Dr. Asa Lente personally, as soon as possible."  May reach patient at [phone].  krs/can  Symptoms  Reason For Call & Symptoms: elevated BP, tingling right hand  Reviewed Health History In EMR: Yes  Reviewed Medications In EMR: Yes  Reviewed Allergies In EMR: Yes  Reviewed Surgeries / Procedures: Yes  Date of Onset of Symptoms: 11/11/2013  Guideline(s) Used:  High Blood Pressure  Disposition Per Guideline:   See Within 2 Weeks in Office  Reason For Disposition Reached:   BP > 160/100  Advice Given:  N/A  Patient Refused Recommendation:  Patient Refused Appt, Patient Requests Appt At Later Date  Patient would like Dr. Asa Lente to call her back personally krs/can

## 2013-11-12 ENCOUNTER — Encounter: Payer: Self-pay | Admitting: Family Medicine

## 2013-11-12 ENCOUNTER — Ambulatory Visit (INDEPENDENT_AMBULATORY_CARE_PROVIDER_SITE_OTHER): Payer: Medicare Other | Admitting: Family Medicine

## 2013-11-12 ENCOUNTER — Telehealth: Payer: Self-pay | Admitting: Internal Medicine

## 2013-11-12 ENCOUNTER — Ambulatory Visit: Payer: Medicare Other | Admitting: Physician Assistant

## 2013-11-12 VITALS — BP 162/88 | HR 68 | Temp 97.6°F | Ht 66.0 in | Wt 150.5 lb

## 2013-11-12 DIAGNOSIS — F419 Anxiety disorder, unspecified: Secondary | ICD-10-CM

## 2013-11-12 DIAGNOSIS — F411 Generalized anxiety disorder: Secondary | ICD-10-CM

## 2013-11-12 DIAGNOSIS — I10 Essential (primary) hypertension: Secondary | ICD-10-CM

## 2013-11-12 MED ORDER — HYDROCHLOROTHIAZIDE 25 MG PO TABS: 25.0000 mg | ORAL_TABLET | Freq: Every day | ORAL | Status: DC

## 2013-11-12 NOTE — Telephone Encounter (Signed)
Please let pt know I have reviewed the situation personally  Please let pt know that I am thinking of her! Sorry I do not have any clinic time this week, but she should still be seen by someone if not me!  I advise an OV with any provider to review her symptoms and be certain we are controlling risk for stroke or other problems during this emotional time.

## 2013-11-12 NOTE — Telephone Encounter (Signed)
MD has already left for today! Noted...Johny Chess

## 2013-11-12 NOTE — Progress Notes (Signed)
Pre visit review using our clinic review tool, if applicable. No additional management support is needed unless otherwise documented below in the visit note. 

## 2013-11-12 NOTE — Telephone Encounter (Signed)
[name] Fox/Patient [phone] called regarding high blood pressure 193/101 and tingling in right hand.  No answer at time of call back at 1044.  Left message on identified answering machine to call if still needs assistance.  Dr Katheren Puller message below NOT relayed to patient due to no contact.

## 2013-11-12 NOTE — Telephone Encounter (Signed)
Pt is seeing Dr. Colin Benton today at 3:45 for her blood pressure.  It is 203/111.

## 2013-11-12 NOTE — Progress Notes (Signed)
No chief complaint on file.   HPI:  [name] Fox is a 73 yo F patient of Dr. Asa Lente with PMH sig for extreme anxiety, dementia (followed by neuro), pseudodementia symptoms, a lot of stress due to divorce lately here for acute visit for:  Elevated Blood Pressure: -up at eye doctor visit 2 days ago 170/90 per her report and she has been very anxious about this and could not get appt with her doctor -on review of chart has had elevated BP on several occ in the past, then normal at follow up visits  -reports intermittent tingling in both fingers a little bit, has been very anxious, husband of 48 years told her he wants a divorce and moved out last week - has been having panic attacks and seeing PCP for this and on benzo which helps -denies: CP, HA, swelling, Palpitations, weakness of extremities, vision changes, pain in arms or legs, speech issues  ROS: See pertinent positives and negatives per HPI.  Past Medical History  Diagnosis Date  . HERPES ZOSTER   . HEARING LOSS, BILATERAL   . OSTEOPENIA   . VITAMIN B12 DEFICIENCY   . Mild cognitive impairment with memory loss     neuropscy eval 10/2012 and follows with neuro  . ALLERGIC RHINITIS   . ANXIETY   . DEGENERATIVE JOINT DISEASE, KNEES, BILATERAL   . DYSLIPIDEMIA   . PMR (polymyalgia rheumatica) dx 10/2011    Initially dx GCA, then PMR clarified -on pred, follows with rheum  . Dementia 08/31/2010    Qualifier: Diagnosis of  By: Asa Lente MD, Jannifer Rodney Fibrocystic breast disease   . Diverticulosis   . Hemorrhoids   . Adenomatous colon polyp 05/26/1999    procedure at [facility] in [place], [place]    Past Surgical History  Procedure Laterality Date  . Bilateral mastectomy      while removing cosmetic silicone implants (NO Breast cancer)  . Bilateral silicone breast explant  1993    Due to rupture/leak @ [facility]    Family History  Problem Relation Age of Onset  . Pancreatic cancer Father   . Heart disease  Brother   . Colon cancer Paternal Grandfather   . Colon cancer Paternal Aunt   . Inflammatory bowel disease Cousin     History   Social History  . Marital Status: Married    Spouse Name: N/A    Number of Children: N/A  . Years of Education: N/A   Social History Main Topics  . Smoking status: Never Smoker   . Smokeless tobacco: Never Used     Comment: Married lives with spouse. adopted son nearby. Retired from public health-nutritionist  . Alcohol Use: 1.8 oz/week    3 Glasses of wine per week     Comment: Occassional white wine  . Drug Use: No  . Sexual Activity: None   Other Topics Concern  . None   Social History Narrative  . None    Current outpatient prescriptions:albuterol (PROAIR HFA) 108 (90 BASE) MCG/ACT inhaler, Inhale 2 puffs into the lungs every 4 (four) hours as needed for wheezing or shortness of breath., Disp: 18 g, Rfl: 1;  alendronate (FOSAMAX) 70 MG tablet, Take 1 tablet (70 mg total) by mouth every 7 (seven) days. Take 1 by mouth weekly, Disp: 4 tablet, Rfl: 5 ALPRAZolam (XANAX) 0.5 MG tablet, Take 0.5-1 tablets (0.25-0.5 mg total) by mouth every 6 (six) hours as needed for anxiety or sleep., Disp: 60  tablet, Rfl: 1;  dextromethorphan-guaiFENesin (MUCINEX DM) 30-600 MG per 12 hr tablet, Take 1 tablet by mouth. Winter season, Disp: , Rfl: ;  donepezil (ARICEPT) 10 MG tablet, Take 1 tablet (10 mg total) by mouth at bedtime., Disp: 30 tablet, Rfl: 3 loratadine (CLARITIN) 10 MG tablet, Take 10 mg by mouth daily as needed.  , Disp: , Rfl: ;  Memantine HCl ER (NAMENDA XR) 28 MG CP24, Take 28 mg by mouth daily., Disp: 30 capsule, Rfl: 5;  Omega-3 Fatty Acids (FISH OIL) 1000 MG CAPS, Take 1,000 mg by mouth daily.  , Disp: , Rfl: ;  pravastatin (PRAVACHOL) 20 MG tablet, TAKE 1 TABLET BY MOUTH EVERY DAY, Disp: 90 tablet, Rfl: 3 predniSONE (DELTASONE) 5 MG tablet, Take 5 mg by mouth every other day. , Disp: , Rfl: ;  sertraline (ZOLOFT) 100 MG tablet, Take 1 tablet (100 mg  total) by mouth daily., Disp: 30 tablet, Rfl: 5;  vitamin B-12 (CYANOCOBALAMIN) 100 MCG tablet, Take 100 mcg by mouth. Not every day, Disp: , Rfl: ;  hydrochlorothiazide (HYDRODIURIL) 25 MG tablet, Take 1 tablet (25 mg total) by mouth daily., Disp: 30 tablet, Rfl: 0  EXAM:  Filed Vitals:   11/12/13 1550  BP: 162/88  Pulse: 68  Temp: 97.6 F (36.4 C)    Body mass index is 24.3 kg/(m^2).  GENERAL: vitals reviewed and listed above, alert, oriented, appears well hydrated and in no acute distress  HEENT: atraumatic, conjunttiva clear, PERRLA, no obvious abnormalities on inspection of external nose and ears  NECK: no obvious masses on inspection  LUNGS: clear to auscultation bilaterally, no wheezes, rales or rhonchi, good air movement  CV: HRRR, no peripheral edema  MS: moves all extremities without noticeable abnormality  PSYCH: pleasant and cooperative, no obvious depression or anxiety  NEURO: gait normal, CN II-XII grossly intact, no nystagumas, finger to nose normal, no pronator drift, normal strength, sensation and DTRs throughout  ASSESSMENT AND PLAN:  Discussed the following assessment and plan:  Hypertension - Plan: hydrochlorothiazide (HYDRODIURIL) 25 MG tablet  Anxiety  -we discussed possible serious and likely etiologies, workup and treatment, treatment risks and return precautions -after this discussion, [name] Fox opted for treatment of her HTN - has had elevated BP at several office visits in the past - discussed medications, risks, options and she opted for HCTZ and follow up with PCP in 2 weeks -normal neuro exam and no findings to suggest stroke - tingling in both hands most likely related to anxiety -follow up advised with PCP in 2 weeks -of course, we advised [name] Fox  to return or notify a doctor immediately if symptoms worsen or persist or new concerns arise.   -Patient advised to return or notify a doctor immediately if symptoms worsen or persist or new concerns  arise.  Patient Instructions  -As we discussed, we have prescribed a new medication for you at this appointment. We discussed the common and serious potential adverse effects of this medication and you can review these and more with the pharmacist when you pick up your medication.  Please follow the instructions for use carefully and notify us immediately if you have any problems taking this medication.  -see a doctor immediately if worsening symptoms or other concerns  -follow up with your doctor in 2 weeks and take your blood pressure cuff to your doctor appointment     Lucretia Kern

## 2013-11-12 NOTE — Telephone Encounter (Signed)
Called pt again still no answer LMOM (home) with md response...[name] Fox

## 2013-11-12 NOTE — Patient Instructions (Addendum)
-  As we discussed, we have prescribed a new medication for you at this appointment. We discussed the common and serious potential adverse effects of this medication and you can review these and more with the pharmacist when you pick up your medication.  Please follow the instructions for use carefully and notify us immediately if you have any problems taking this medication.  -see a doctor immediately if worsening symptoms or other concerns  -follow up with your doctor in 2 weeks and take your blood pressure cuff to your doctor appointment

## 2013-11-14 ENCOUNTER — Telehealth: Payer: Self-pay | Admitting: Internal Medicine

## 2013-11-14 MED ORDER — AMLODIPINE BESYLATE 5 MG PO TABS: 5.0000 mg | ORAL_TABLET | Freq: Every day | ORAL | Status: DC

## 2013-11-14 NOTE — Telephone Encounter (Signed)
Pt saw dr Maudie Mercury on 4/28 and was prescribed hctz. Per pharm pt is allergic to sulfa she has experience rash and swelling. [place]

## 2013-11-14 NOTE — Telephone Encounter (Signed)
Please call pt and let her know:  While it is unlikely that an allergy to sulfasalazine would result in allergy to hctz, it is possible and avoidance would be advised. Is she ok with taking Norvasc instead? If so please send Norvasc 5 mg, PO QD, #30, 0RFs - advise her to follow up with Dr. Asa Lente. Thanks.

## 2013-11-15 ENCOUNTER — Encounter: Payer: Self-pay | Admitting: Internal Medicine

## 2013-11-15 ENCOUNTER — Ambulatory Visit (INDEPENDENT_AMBULATORY_CARE_PROVIDER_SITE_OTHER): Payer: Medicare Other | Admitting: Internal Medicine

## 2013-11-15 VITALS — BP 150/100 | HR 64 | Temp 97.3°F | Wt 152.0 lb

## 2013-11-15 DIAGNOSIS — R4689 Other symptoms and signs involving appearance and behavior: Secondary | ICD-10-CM

## 2013-11-15 DIAGNOSIS — F919 Conduct disorder, unspecified: Secondary | ICD-10-CM

## 2013-11-15 DIAGNOSIS — I10 Essential (primary) hypertension: Secondary | ICD-10-CM | POA: Insufficient documentation

## 2013-11-15 DIAGNOSIS — R030 Elevated blood-pressure reading, without diagnosis of hypertension: Secondary | ICD-10-CM

## 2013-11-15 NOTE — Progress Notes (Signed)
   Subjective:    Patient ID: [name] Fox, female    DOB: [birth year], 73 y.o.   MRN: [id number]  HPI She was seen on 4/28 for increased blood pressure readings at home and R arm and finger tingling and decreased strength in the R hand.  She describes intermittent periods of light-headedness.  At that time, Dr. Maudie Mercury prescribed HCTZ for HTN. Because of sulfa allergy, the prescription was changed to Amlodipine 5 mg, however she denies notification of this change.  Of note, she reports increased stressors within the last 2 weeks as her husband of 36 years moved out of the home. She is tearful. Denies SI.     Review of Systems Denies fever, chills, sweats, nausea/vomiting, dyspnea, chest pain, palpitations, claudication, slurred speech or difficulty speaking, headaches, nosebleeds, numbness in extremities.  She is followed by Dr. Krista Blue (Neuro) for dementia.       Objective:   Physical Exam Appears healthy and well-nourished & in no acute distress.Appears younger than stated age No carotid bruits are present.No neck pain distention present at 10 - 15 degrees. Thyroid normal to palpation  Heart rhythm and rate are normal with no gallop or murmur  Chest is clear with no increased work of breathing  There is no evidence of aortic aneurysm or renal artery bruits  Abdomen soft with no organomegaly or masses. No HJR  No clubbing, cyanosis or edema present.  Pedal pulses are intact  No ischemic skin changes are present . Fingernails/ toenails healthy  Alert  But unable to correctly identify date. Strength, tone, DTRs reflexes normal           Assessment & Plan:  #1 HTN , uncontrolled #2 stress, exogenous See AVS

## 2013-11-15 NOTE — Progress Notes (Signed)
Pre visit review using our clinic review tool, if applicable. No additional management support is needed unless otherwise documented below in the visit note. 

## 2013-11-15 NOTE — Patient Instructions (Addendum)
Start Amlodipine 5 mg; it is @ the pharmacy . Minimal Blood Pressure Goal= AVERAGE < 140/90;  Ideal is an AVERAGE < 135/85. This AVERAGE should be calculated from @ least 5-7 BP readings taken @ different times of day on different days of week. You should not respond to isolated BP readings , but rather the AVERAGE for that week .Please bring your  blood pressure cuff to office visits to verify that it is reliable.It  can also be checked against the blood pressure device at the pharmacy. Finger or wrist cuffs are not dependable; an arm cuff is.

## 2013-11-15 NOTE — Progress Notes (Signed)
   Subjective:    Patient ID: [name] Fox, female    DOB: [birth year], 73 y.o.   MRN: [id number]  HPI  She was seen on 4/28 for increased blood pressure readings at home and R arm and finger tingling and decreased strength in the R hand. She describes intermittent periods of light-headedness.  At that time, Dr. Maudie Mercury prescribed HCTZ for HTN. The prescription was changed to Amlodipine 5 mg, however she denies notification of this change.   Of note, she reports increased stressors within the last 2 weeks as her husband of 36 years moved out of the home. She is tearful. Denies SI.   Review of Systems Denies fever, chills, sweats, nausea/vomiting, dyspnea, chest pain, palpitations, claudication, slurred speech or difficulty speaking, headaches, nosebleeds, numbness in extremities.  She is followed by Dr. Krista Blue (Neuro) for dementia.      Objective:   Physical Exam Appears healthy and well-nourished & in no acute distress  No carotid bruits are present.No neck pain distention present at 10 - 15 degrees. Thyroid normal to palpation  Heart rhythm and rate are normal with no gallop or murmur  Chest is clear with no increased work of breathing  There is no evidence of aortic aneurysm or renal artery bruits  Abdomen soft with no organomegaly or masses. No HJR  No clubbing, cyanosis or edema present.  Pedal pulses are intact   No ischemic skin changes are present . Fingernails/ toenails healthy   Alert and oriented. Strength, tone, DTRs reflexes normal        Assessment & Plan:  #1 hypertension, uncontrolled #2

## 2013-12-06 ENCOUNTER — Encounter: Payer: Self-pay | Admitting: Internal Medicine

## 2013-12-06 ENCOUNTER — Ambulatory Visit (INDEPENDENT_AMBULATORY_CARE_PROVIDER_SITE_OTHER): Payer: Medicare Other | Admitting: Internal Medicine

## 2013-12-06 VITALS — BP 144/94 | HR 59 | Temp 98.1°F | Wt 151.0 lb

## 2013-12-06 DIAGNOSIS — R202 Paresthesia of skin: Secondary | ICD-10-CM

## 2013-12-06 DIAGNOSIS — R200 Anesthesia of skin: Secondary | ICD-10-CM | POA: Insufficient documentation

## 2013-12-06 DIAGNOSIS — I10 Essential (primary) hypertension: Secondary | ICD-10-CM

## 2013-12-06 DIAGNOSIS — R209 Unspecified disturbances of skin sensation: Secondary | ICD-10-CM

## 2013-12-06 MED ORDER — AMLODIPINE BESYLATE 10 MG PO TABS: 10.0000 mg | ORAL_TABLET | Freq: Every day | ORAL | Status: DC

## 2013-12-06 NOTE — Assessment & Plan Note (Signed)
BP Readings from Last 3 Encounters:  12/06/13 144/94  11/15/13 150/100  11/12/13 162/88   Reviewed recent events and home BP log-  Intol of HCTZ due to ?allg (sulfa x) On amlodipine but not at goal -  Titrate up same now

## 2013-12-06 NOTE — Progress Notes (Signed)
Pre visit review using our clinic review tool, if applicable. No additional management support is needed unless otherwise documented below in the visit note. 

## 2013-12-06 NOTE — Progress Notes (Signed)
Subjective:    Patient ID: [name] Fox, female    DOB: [birth year], 73 y.o.   MRN: [id number]  HPI  Patient is here for follow up  Reviewed chronic medical issues and interval medical events  Past Medical History  Diagnosis Date  . HERPES ZOSTER   . HEARING LOSS, BILATERAL   . OSTEOPENIA   . VITAMIN B12 DEFICIENCY   . Mild cognitive impairment with memory loss     neuropscy eval 10/2012 and follows with neuro  . ALLERGIC RHINITIS   . ANXIETY   . DEGENERATIVE JOINT DISEASE, KNEES, BILATERAL   . DYSLIPIDEMIA   . PMR (polymyalgia rheumatica) dx 10/2011    Initially dx GCA, then PMR clarified -on pred, follows with rheum  . Dementia 08/31/2010    Qualifier: Diagnosis of  By: Asa Lente MD, Jannifer Rodney Fibrocystic breast disease   . Diverticulosis   . Hemorrhoids   . Adenomatous colon polyp 05/26/1999    procedure at [facility] in [place], [place]    Review of Systems  Constitutional: Negative for fever, fatigue and unexpected weight change.  Respiratory: Negative for cough and shortness of breath.   Cardiovascular: Negative for chest pain and leg swelling.  Neurological: Positive for numbness (R hand at fingertips, but improved). Negative for dizziness, tremors, syncope, weakness, light-headedness and headaches.  Psychiatric/Behavioral: Negative for suicidal ideas, behavioral problems, sleep disturbance, self-injury and decreased concentration. The patient is not nervous/anxious.        Objective:   Physical Exam  BP 144/94  Pulse 59  Temp(Src) 98.1 F (36.7 C) (Oral)  Wt 151 lb (68.493 kg)  SpO2 95% Wt Readings from Last 3 Encounters:  12/06/13 151 lb (68.493 kg)  11/15/13 152 lb (68.947 kg)  11/12/13 150 lb 8 oz (68.266 kg)   Constitutional: She appears well-developed and well-nourished. No distress.  Neck: Normal range of motion. Neck supple. No JVD present. No thyromegaly present.  Cardiovascular: Normal rate, regular rhythm and normal heart sounds.   No murmur heard. No BLE edema. Pulmonary/Chest: Effort normal and breath sounds normal. No respiratory distress. She has no wheezes.  Musculoskeletal: no deformity right hand. Good grip strength. Normal range of motion, no joint effusions. No gross deformities Neurological: She is alert and oriented to person, place, and time. No cranial nerve deficit. Coordination, balance, strength, speech and gait are normal.  Psychiatric: She has a normal mood and affect. Her behavior is normal. Judgment and thought content normal.    Lab Results  Component Value Date   WBC 7.1 04/09/2013   HGB 13.4 04/09/2013   HCT 39.4 04/09/2013   PLT 360.0 04/09/2013   GLUCOSE 92 04/09/2013   CHOL 217* 02/05/2013   TRIG 39.0 02/05/2013   HDL 88.30 02/05/2013   LDLDIRECT 114.9 02/05/2013   LDLCALC 112* 10/31/2011   ALT 15 04/09/2013   AST 19 04/09/2013   NA 136 04/09/2013   K 3.9 04/09/2013   CL 99 04/09/2013   CREATININE 0.7 04/09/2013   BUN 9 04/09/2013   CO2 31 04/09/2013   TSH 1.50 04/09/2013    Ct Abdomen Pelvis W Contrast  04/09/2013   CLINICAL DATA:  Abnormal unintentional weight loss. Diarrhea. Family history of pancreatic carcinoma.  EXAM: CT ABDOMEN AND PELVIS WITH CONTRAST  TECHNIQUE: Multidetector CT imaging of the abdomen and pelvis was performed using the standard protocol following bolus administration of intravenous contrast.  CONTRAST:  100 cc Omnipaque 300 and oral contrast  COMPARISON:  None.  FINDINGS: The liver, gallbladder, pancreas, spleen, adrenal glands, and both kidneys are normal in appearance. No evidence of hydronephrosis.  Uterus and adnexal regions are unremarkable. No soft tissue masses or lymphadenopathy identified within the abdomen or pelvis. No evidence of inflammatory process or abnormal fluid collections. No evidence of dilated bowel loops or hernia. Mild diverticulosis is seen involving the left colon, however there is no evidence of diverticulitis.  IMPRESSION: No acute findings.   Diverticulosis. No radiographic evidence of diverticulitis.   Electronically Signed   By: Earle Gell   On: 04/09/2013 15:45       Assessment & Plan:   Problem List Items Addressed This Visit   Hypertension - Primary      BP Readings from Last 3 Encounters:  12/06/13 144/94  11/15/13 150/100  11/12/13 162/88   Reviewed recent events and home BP log-  Intol of HCTZ due to ?allg (sulfa x) On amlodipine but not at goal -  Titrate up same now    Relevant Medications      amLODIpine (NORVASC) tablet   Numbness and tingling in right hand     Onset 4-6 weeks ago after overuse (moving out spouse, excessive house clening with separation) Reports symptoms have dramatically improved since onset No other central neuro symptoms 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

## 2013-12-06 NOTE — Patient Instructions (Signed)
It was good to see you today.  We have reviewed your prior records including labs and tests today  Medications reviewed and updated -increase amlodipine to 10 mg daily for blood pressure - no other changes recommended at this time.  Your prescription(s) have been submitted to your pharmacy. Please take as directed and contact our office if you believe you are having problem(s) with the medication(s).  If hand numbness symptoms worsen, or if any other new weakness, vision problems, speech problems or other symptoms of possible stroke, please call immediately for advice or go to the emergency room  Please schedule followup in 4-6 weeks for blood pressure check, call sooner if problems.

## 2013-12-06 NOTE — Assessment & Plan Note (Signed)
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

## 2013-12-10 ENCOUNTER — Telehealth: Payer: Self-pay | Admitting: Internal Medicine

## 2013-12-10 NOTE — Telephone Encounter (Signed)
Relevant patient education assigned to patient using Emmi. ° °

## 2014-01-20 ENCOUNTER — Other Ambulatory Visit: Payer: Self-pay | Admitting: Internal Medicine

## 2014-02-05 ENCOUNTER — Encounter: Payer: Self-pay | Admitting: Internal Medicine

## 2014-02-05 ENCOUNTER — Ambulatory Visit (INDEPENDENT_AMBULATORY_CARE_PROVIDER_SITE_OTHER): Payer: Medicare Other | Admitting: Internal Medicine

## 2014-02-05 VITALS — BP 128/70 | HR 63 | Temp 97.6°F | Ht 66.0 in | Wt 152.8 lb

## 2014-02-05 DIAGNOSIS — I10 Essential (primary) hypertension: Secondary | ICD-10-CM

## 2014-02-05 DIAGNOSIS — M353 Polymyalgia rheumatica: Secondary | ICD-10-CM

## 2014-02-05 DIAGNOSIS — F0390 Unspecified dementia without behavioral disturbance: Secondary | ICD-10-CM

## 2014-02-05 DIAGNOSIS — E785 Hyperlipidemia, unspecified: Secondary | ICD-10-CM

## 2014-02-05 MED ORDER — AMLODIPINE BESYLATE 10 MG PO TABS: 5.0000 mg | ORAL_TABLET | Freq: Every day | ORAL | Status: DC

## 2014-02-05 MED ORDER — MEMANTINE HCL ER 14 MG PO CP24: 14.0000 mg | ORAL_CAPSULE | Freq: Every day | ORAL | Status: DC

## 2014-02-05 NOTE — Assessment & Plan Note (Signed)
On prava for same - tol well Check annually and titrate as needed The current medical regimen is effective;  continue present plan and medications.

## 2014-02-05 NOTE — Assessment & Plan Note (Signed)
BP Readings from Last 3 Encounters:  02/05/14 128/70  12/06/13 144/94  11/15/13 150/100   Reviewed home BP log-  Intol of HCTZ due to ?allg (sulfa) On amlodipine, generally at goal -  Increased dose amlodipine 11/2013 resulted in symptomatic overtx - so takes 1/2 of 10mg  unless SBP>140

## 2014-02-05 NOTE — Progress Notes (Signed)
Subjective:    Patient ID: [name] Fox, female    DOB: 30-May-1941, 73 y.o.   MRN: [id number]  HPI  Patient is here for follow up  Reviewed chronic medical issues and interval medical events  Past Medical History  Diagnosis Date  . HERPES ZOSTER   . HEARING LOSS, BILATERAL   . OSTEOPENIA   . VITAMIN B12 DEFICIENCY   . Mild cognitive impairment with memory loss     neuropscy eval 10/2012 and follows with neuro  . ALLERGIC RHINITIS   . ANXIETY   . DEGENERATIVE JOINT DISEASE, KNEES, BILATERAL   . DYSLIPIDEMIA   . PMR (polymyalgia rheumatica) dx 10/2011    Initially dx G[facility], then PMR clarified -on pred, follows with rheum  . Dementia 08/31/2010    Qualifier: Diagnosis of  By: Asa Lente MD, Jannifer Rodney Fibrocystic breast disease   . Diverticulosis   . Hemorrhoids   . Adenomatous colon polyp 05/26/1999    procedure at [facility] in [place], [place]    Review of Systems  Constitutional: Negative for fever and unexpected weight change.  Respiratory: Negative for cough and shortness of breath.   Musculoskeletal: Negative for arthralgias and joint swelling.  Neurological: Negative for headaches.  Psychiatric/Behavioral: Negative for sleep disturbance. The patient is not nervous/anxious and is not hyperactive.        Objective:   Physical Exam  BP 128/70  Pulse 63  Temp(Src) 97.6 F (36.4 C) (Oral)  Ht 5' 6"  (1.676 m)  Wt 152 lb 12 oz (69.287 kg)  BMI 24.67 kg/m2  SpO2 97% Wt Readings from Last 3 Encounters:  02/05/14 152 lb 12 oz (69.287 kg)  12/06/13 151 lb (68.493 kg)  11/15/13 152 lb (68.947 kg)   Constitutional: She appears well-developed and well-nourished. No distress.  Neck: Normal range of motion. Neck supple. No JVD present. No thyromegaly present.  Cardiovascular: Normal rate, regular rhythm and normal heart sounds.  No murmur heard. No BLE edema. Pulmonary/Chest: Effort normal and breath sounds normal. No respiratory distress. She has no wheezes.    Psychiatric: She has a normal mood and affect. Her behavior is normal. Judgment and thought content normal.   Lab Results  Component Value Date   WBC 7.1 04/09/2013   HGB 13.4 04/09/2013   HCT 39.4 04/09/2013   PLT 360.0 04/09/2013   GLUCOSE 92 04/09/2013   CHOL 217* 02/05/2013   TRIG 39.0 02/05/2013   HDL 88.30 02/05/2013   LDLDIRECT 114.9 02/05/2013   LDLCALC 112* 10/31/2011   ALT 15 04/09/2013   AST 19 04/09/2013   NA 136 04/09/2013   K 3.9 04/09/2013   CL 99 04/09/2013   CREATININE 0.7 04/09/2013   BUN 9 04/09/2013   CO2 31 04/09/2013   TSH 1.50 04/09/2013    Ct Abdomen Pelvis W Contrast  04/09/2013   CLINICAL DATA:  Abnormal unintentional weight loss. Diarrhea. Family history of pancreatic carcinoma.  EXAM: CT ABDOMEN AND PELVIS WITH CONTRAST  TECHNIQUE: Multidetector CT imaging of the abdomen and pelvis was performed using the standard protocol following bolus administration of intravenous contrast.  CONTRAST:  100 cc Omnipaque 300 and oral contrast  COMPARISON:  None.  FINDINGS: The liver, gallbladder, pancreas, spleen, adrenal glands, and both kidneys are normal in appearance. No evidence of hydronephrosis.  Uterus and adnexal regions are unremarkable. No soft tissue masses or lymphadenopathy identified within the abdomen or pelvis. No evidence of inflammatory process or abnormal fluid collections. No evidence of  dilated bowel loops or hernia. Mild diverticulosis is seen involving the left colon, however there is no evidence of diverticulitis.  IMPRESSION: No acute findings.  Diverticulosis. No radiographic evidence of diverticulitis.   Electronically Signed   By: Earle Gell   On: 04/09/2013 15:45       Assessment & Plan:   Problem List Items Addressed This Visit   Dementia     Labs reviewed and MRI brain 10/2011 Progressive symptoms -  S/p eval 02/2012 by neuro - felt consistent with mild dementia, confirmed on spring 2014 neuropsyc eval>mild cognitive dysfunction started aricept 2m qhs  06/2012, titrated up 10/2012 when ineligible for study trial 10/2012 Added namenda 01/2013, off same since 04/2013 due to national shortage on XR, then resumed - but feels no longer needed as memory has improved with "less stress" since spouse moved out 11/2013 ("psuedodementia overlap") Will wean down Namenda dose now -erx for 1/2 dose done Encouraged to follow up neuro as needed Also continue SSRI for "pseudodementia symptoms" as early symptoms improved initially on tx for same      Relevant Medications      Memantine HCl ER (NAMENDA XR) 14 MG CP24   DYSLIPIDEMIA     On prava for same - tol well Check annually and titrate as needed The current medical regimen is effective;  continue present plan and medications.     Relevant Medications      amLODIpine (NORVASC) tablet   Other Relevant Orders      Lipid panel   Hypertension - Primary      BP Readings from Last 3 Encounters:  02/05/14 128/70  12/06/13 144/94  11/15/13 150/100   Reviewed home BP log-  Intol of HCTZ due to ?allg (sulfa) On amlodipine, generally at goal -  Increased dose amlodipine 11/2013 resulted in symptomatic overtx - so takes 1/2 of 164munless SBP>140    Relevant Medications      amLODIpine (NORVASC) tablet   Other Relevant Orders      Lipid panel   PMR (polymyalgia rheumatica)      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 in setting of  headache and "swelling" B temples - declined bx at advice of rheum 10/2011 AnOuida Sillstarted high dose pred 10/2011 with improvement of symptoms and sed rate decrease from 103 to 14 on 11/16/11 at [facility] (esr 19 and CRP 1.3 on 01/25/12) - Trial off pred early 12/2011 with rapid recurrence of symptoms resumed pred 40 mg/d 12/2011 thru 02/2012 when stopped pred again-  S/p 2nd local rheum eval on same because of rising ESR - now with [facility] (pPauletta BrownsMA) since 05/2012  Feels dx more consistent with PMR than GCA Reviewed MRI/MRA brain 10/2011 - no vasculitis findings Tapering off pred, on 41m12mod since  04/2013, then none since 12/2013 ([facility] symptoms controlled at present without meds - no [facility] eval planned  Lab Results  Component Value Date   ESRSEDRATE 103* 10/31/2011   Lab Results  Component Value Date   ESRSEDRATE 70* 06/06/2012

## 2014-02-05 NOTE — Progress Notes (Signed)
Pre visit review using our clinic review tool, if applicable. No additional management support is needed unless otherwise documented below in the visit note. 

## 2014-02-05 NOTE — Assessment & Plan Note (Addendum)
Labs reviewed and MRI brain 10/2011 Progressive symptoms -  S/p eval 02/2012 by neuro - felt consistent with mild dementia, confirmed on spring 2014 neuropsyc eval>mild cognitive dysfunction started aricept 5mg  qhs 06/2012, titrated up 10/2012 when ineligible for study trial 10/2012 Added namenda 01/2013, off same since 04/2013 due to national shortage on XR, then resumed - but feels no longer needed as memory has improved with "less stress" since spouse moved out 11/2013 ("psuedodementia overlap") Will wean down Namenda dose now -erx for 1/2 dose done Encouraged to follow up neuro as needed Also continue SSRI for "pseudodementia symptoms" as early symptoms improved initially on tx for same

## 2014-02-05 NOTE — Patient Instructions (Signed)
It was good to see you today.  We have reviewed your prior records including labs and tests today  Test(s) ordered today. Return when you are fasting. Your results will be released to [name] Fox (or called to you) after review, usually within 72hours after test completion. If any changes need to be made, you will be notified at that same time.  Medications reviewed and updated  Reduce Namenda to 14mg  daily x 1 month, then stop No other changes recommended at this time. Refill on medication(s) as discussed today. Your prescription(s) have been submitted to your pharmacy. Please take as directed and contact our office if you believe you are having problem(s) with the medication(s).  Please schedule followup in 6-12 months for annual exam/labs, call sooner if problems.

## 2014-02-05 NOTE — Assessment & Plan Note (Signed)
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 in setting of  headache and "swelling" B temples - declined bx at advice of rheum 10/2011 Ouida Sills started high dose pred 10/2011 with improvement of symptoms and sed rate decrease from 103 to 14 on 11/16/11 at [facility] (esr 19 and CRP 1.3 on 01/25/12) - Trial off pred early 12/2011 with rapid recurrence of symptoms resumed pred 40 mg/d 12/2011 thru 02/2012 when stopped pred again-  S/p 2nd local rheum eval on same because of rising ESR - now with [facility] Pauletta Browns [facility]) since 05/2012  Feels dx more consistent with PMR than GCA Reviewed MRI/MRA brain 10/2011 - no vasculitis findings Tapering off pred, on 68m qod since 04/2013, then none since 12/2013 (Tr[facility]low symptoms controlled at present without meds - no [facility] eval planned  Lab Results  Component Value Date   ESRSEDRATE 103* 10/31/2011   Lab Results  Component Value Date   ESRSEDRATE 70* 06/06/2012

## 2014-02-06 ENCOUNTER — Telehealth: Payer: Self-pay | Admitting: Internal Medicine

## 2014-02-06 NOTE — Telephone Encounter (Signed)
Relevant patient education assigned to patient using Emmi. ° °

## 2014-02-17 ENCOUNTER — Other Ambulatory Visit (INDEPENDENT_AMBULATORY_CARE_PROVIDER_SITE_OTHER): Payer: Medicare Other

## 2014-02-17 DIAGNOSIS — E785 Hyperlipidemia, unspecified: Secondary | ICD-10-CM

## 2014-02-17 DIAGNOSIS — I10 Essential (primary) hypertension: Secondary | ICD-10-CM

## 2014-02-17 LAB — LIPID PANEL
CHOL/HDL RATIO: 3
Cholesterol: 248 mg/dL — ABNORMAL HIGH (ref 0–200)
HDL: 94.1 mg/dL (ref >39.00)
LDL CALC: 141 mg/dL — AB (ref 0–99)
NonHDL: 153.9
TRIGLYCERIDES: 67 mg/dL (ref 0.0–149.0)
VLDL: 13.4 mg/dL (ref 0.0–40.0)

## 2014-05-02 ENCOUNTER — Other Ambulatory Visit: Payer: Self-pay

## 2014-06-17 ENCOUNTER — Other Ambulatory Visit (INDEPENDENT_AMBULATORY_CARE_PROVIDER_SITE_OTHER): Payer: Medicare Other

## 2014-06-17 ENCOUNTER — Encounter: Payer: Self-pay | Admitting: Internal Medicine

## 2014-06-17 ENCOUNTER — Ambulatory Visit (INDEPENDENT_AMBULATORY_CARE_PROVIDER_SITE_OTHER): Payer: Medicare Other | Admitting: Internal Medicine

## 2014-06-17 VITALS — BP 162/90 | HR 63 | Temp 97.9°F | Ht 66.0 in | Wt 153.5 lb

## 2014-06-17 DIAGNOSIS — F0390 Unspecified dementia without behavioral disturbance: Secondary | ICD-10-CM

## 2014-06-17 DIAGNOSIS — E785 Hyperlipidemia, unspecified: Secondary | ICD-10-CM

## 2014-06-17 DIAGNOSIS — Z23 Encounter for immunization: Secondary | ICD-10-CM

## 2014-06-17 DIAGNOSIS — E538 Deficiency of other specified B group vitamins: Secondary | ICD-10-CM

## 2014-06-17 DIAGNOSIS — I10 Essential (primary) hypertension: Secondary | ICD-10-CM

## 2014-06-17 LAB — URINALYSIS, ROUTINE W REFLEX MICROSCOPIC
BILIRUBIN URINE: NEGATIVE
HGB URINE DIPSTICK: NEGATIVE
KETONES UR: NEGATIVE
Leukocytes, UA: NEGATIVE
NITRITE: NEGATIVE
RBC / HPF: NONE SEEN (ref 0–?)
Specific Gravity, Urine: <=1.005 — AB (ref 1.000–1.030)
TOTAL PROTEIN, URINE-UPE24: NEGATIVE
Urine Glucose: NEGATIVE
Urobilinogen, UA: 0.2 (ref 0.0–1.0)
WBC, UA: NONE SEEN (ref 0–?)
pH: 7 (ref 5.0–8.0)

## 2014-06-17 LAB — CBC WITH DIFFERENTIAL/PLATELET
BASOS ABS: 0.1 10*3/uL (ref 0.0–0.1)
Basophils Relative: 1.4 % (ref 0.0–3.0)
EOS PCT: 3.6 % (ref 0.0–5.0)
Eosinophils Absolute: 0.2 10*3/uL (ref 0.0–0.7)
HCT: 39.2 % (ref 36.0–46.0)
Hemoglobin: 13.1 g/dL (ref 12.0–15.0)
LYMPHS PCT: 26.5 % (ref 12.0–46.0)
Lymphs Abs: 1.6 10*3/uL (ref 0.7–4.0)
MCHC: 33.4 g/dL (ref 30.0–36.0)
MCV: 88.6 fl (ref 78.0–100.0)
MONOS PCT: 7.1 % (ref 3.0–12.0)
Monocytes Absolute: 0.4 10*3/uL (ref 0.1–1.0)
NEUTROS PCT: 61.4 % (ref 43.0–77.0)
Neutro Abs: 3.6 10*3/uL (ref 1.4–7.7)
PLATELETS: 361 10*3/uL (ref 150.0–400.0)
RBC: 4.42 Mil/uL (ref 3.87–5.11)
RDW: 14.1 % (ref 11.5–15.5)
WBC: 5.9 10*3/uL (ref 4.0–10.5)

## 2014-06-17 LAB — TSH: TSH: 2.28 u[IU]/mL (ref 0.35–4.50)

## 2014-06-17 LAB — VITAMIN B12: VITAMIN B 12: 162 pg/mL — AB (ref 211–911)

## 2014-06-17 MED ORDER — PRAVASTATIN SODIUM 20 MG PO TABS: 20.0000 mg | ORAL_TABLET | Freq: Every day | ORAL | Status: DC

## 2014-06-17 MED ORDER — SERTRALINE HCL 100 MG PO TABS: 100.0000 mg | ORAL_TABLET | Freq: Every day | ORAL | Status: DC

## 2014-06-17 NOTE — Assessment & Plan Note (Signed)
Labs reviewed and MRI brain 10/2011 Slowly progressive and intermittent flare of symptoms -  S/p eval 02/2012 by neuro - felt consistent with mild dementia, confirmed on spring 2014 neuropsyc eval>mild cognitive dysfunction started aricept 5mg  qhs 06/2012, titrated up 10/2012 when ineligible for study trial 10/2012 Added namenda 01/2013, off same since 04/2013 due to national shortage on XR, then resumed - but 01/2014 stopped same as felt no longer needed as memory had improved with "less stress" since spouse moved out 11/2013 ("psuedodementia overlap") Divorce proceedings ongoing and reviewed Will arrange follow up neuro for updated assessment at this time 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" as early symptoms improved initially on tx for same  Reviewed with pt and niece Betsy Pries

## 2014-06-17 NOTE — Assessment & Plan Note (Signed)
On prava for same - tol well Check annually and titrate as needed The current medical regimen is effective;  continue present plan and medications.

## 2014-06-17 NOTE — Assessment & Plan Note (Signed)
Lab Results  Component Value Date   VITAMINB12 220 08/31/2010   started monthly IM replacement 08/2010 for same - ?contribution to "pseduodementia" symptoms Overall improved -  Changed to oral replacement at pt preference and stopped stop IM recheck level now and resume IM replacement as needed

## 2014-06-17 NOTE — Progress Notes (Signed)
Subjective:    Patient ID: [name] Fox, female    DOB: Sep 24, 1940, 73 y.o.   MRN: [id number]  HPI  Patient is here for follow up to discuss chronic medical issues and interval medical events  Past Medical History  Diagnosis Date  . HERPES ZOSTER   . HEARING LOSS, BILATERAL   . OSTEOPENIA   . VITAMIN B12 DEFICIENCY   . Mild cognitive impairment with memory loss     neuropscy eval 10/2012 and follows with neuro  . ALLERGIC RHINITIS   . ANXIETY   . DEGENERATIVE JOINT DISEASE, KNEES, BILATERAL   . DYSLIPIDEMIA   . PMR (polymyalgia rheumatica) dx 10/2011    Initially dx [facility], then PMR clarified -on pred, follows with rheum  . Dementia 08/31/2010    Qualifier: Diagnosis of  By: Asa Lente MD, Jannifer Rodney Fibrocystic breast disease   . Diverticulosis   . Hemorrhoids   . Adenomatous colon polyp 05/26/1999    procedure at [facility] in [place], [place]    Review of Systems  Constitutional: Positive for fatigue. Negative for fever and unexpected weight change.  Respiratory: Negative for cough and shortness of breath.   Cardiovascular: Negative for chest pain and palpitations.  Psychiatric/Behavioral: Positive for confusion and decreased concentration. Negative for suicidal ideas, hallucinations, behavioral problems, self-injury, dysphoric mood and agitation. The patient is nervous/anxious. The patient is not hyperactive.        Objective:   Physical Exam  BP 162/90 mmHg  Pulse 63  Temp(Src) 97.9 F (36.6 C) (Oral)  Ht 5\' 6"  (1.676 m)  Wt 153 lb 8 oz (69.627 kg)  BMI 24.79 kg/m2  SpO2 95% Wt Readings from Last 3 Encounters:  06/17/14 153 lb 8 oz (69.627 kg)  02/05/14 152 lb 12 oz (69.287 kg)  12/06/13 151 lb (68.493 kg)   Constitutional: She appears well-developed and well-nourished. No distress. Marcia (neice) at side Neck: Normal range of motion. Neck supple. No JVD present. No thyromegaly present.  Cardiovascular: Normal rate, regular rhythm and normal heart  sounds.  No murmur heard. No BLE edema. Pulmonary/Chest: Effort normal and breath sounds normal. No respiratory distress. She has no wheezes.  Psychiatric: She has a normal mood and affect. Her behavior is normal. Judgment and thought content normal.   Lab Results  Component Value Date   WBC 7.1 04/09/2013   HGB 13.4 04/09/2013   HCT 39.4 04/09/2013   PLT 360.0 04/09/2013   GLUCOSE 92 04/09/2013   CHOL 248* 02/17/2014   TRIG 67.0 02/17/2014   HDL 94.10 02/17/2014   LDLDIRECT 114.9 02/05/2013   LDLCALC 141* 02/17/2014   ALT 15 04/09/2013   AST 19 04/09/2013   NA 136 04/09/2013   K 3.9 04/09/2013   CL 99 04/09/2013   CREATININE 0.7 04/09/2013   BUN 9 04/09/2013   CO2 31 04/09/2013   TSH 1.50 04/09/2013   Lab Results  Component Value Date   VITAMINB12 220 08/31/2010    Ct Abdomen Pelvis W Contrast  04/09/2013   CLINICAL DATA:  Abnormal unintentional weight loss. Diarrhea. Family history of pancreatic carcinoma.  EXAM: CT ABDOMEN AND PELVIS WITH CONTRAST  TECHNIQUE: Multidetector CT imaging of the abdomen and pelvis was performed using the standard protocol following bolus administration of intravenous contrast.  CONTRAST:  100 cc Omnipaque 300 and oral contrast  COMPARISON:  None.  FINDINGS: The liver, gallbladder, pancreas, spleen, adrenal glands, and both kidneys are normal in appearance. No evidence of hydronephrosis.  Uterus and adnexal regions are unremarkable. No soft tissue masses or lymphadenopathy identified within the abdomen or pelvis. No evidence of inflammatory process or abnormal fluid collections. No evidence of dilated bowel loops or hernia. Mild diverticulosis is seen involving the left colon, however there is no evidence of diverticulitis.  IMPRESSION: No acute findings.  Diverticulosis. No radiographic evidence of diverticulitis.   Electronically Signed   By: Earle Gell   On: 04/09/2013 15:45       Assessment & Plan:   Problem List Items Addressed This Visit     Dementia - Primary    Labs reviewed and MRI brain 10/2011 Slowly progressive and intermittent flare of symptoms -  S/p eval 02/2012 by neuro - felt consistent with mild dementia, confirmed on spring 2014 neuropsyc eval>mild cognitive dysfunction started aricept 5mg  qhs 06/2012, titrated up 10/2012 when ineligible for study trial 10/2012 Added namenda 01/2013, off same since 04/2013 due to national shortage on XR, then resumed - but 01/2014 stopped same as felt no longer needed as memory had improved with "less stress" since spouse moved out 11/2013 ("psuedodementia overlap") Divorce proceedings ongoing and reviewed Will arrange follow up neuro for updated assessment at this time 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" as early symptoms improved initially on tx for same  Reviewed with pt and niece Betsy Pries    Relevant Medications      sertraline (ZOLOFT) tablet   Other Relevant Orders      Ambulatory referral to Neurology      Basic metabolic panel      CBC with Differential      Hepatic function panel      TSH      Urinalysis, Routine w reflex microscopic   Dyslipidemia    On prava for same - tol well Check annually and titrate as needed The current medical regimen is effective;  continue present plan and medications.     Relevant Medications      pravastatin (PRAVACHOL) tablet   Other Relevant Orders      Lipid panel   Hypertension    BP Readings from Last 3 Encounters:  06/17/14 162/90  02/05/14 128/70  12/06/13 144/94   Intol of HCTZ due to ?allg (sulfa) On amlodipine, generally at goal when compliant, but reviewed recent noncompliance due to cognitive dysfunction Increased dose amlodipine 11/2013 resulted in symptomatic overtx - so takes 1/2 of 10mg  unless SBP>140 Patient will resume medication, monitoring blood pressure and call if uncontrolled Patient's niece winter patient uses pillbox to verify compliance    Relevant Medications      pravastatin (PRAVACHOL)  tablet   Other Relevant Orders      Basic metabolic panel      CBC with Differential      Hepatic function panel      Lipid panel      TSH      Urinalysis, Routine w reflex microscopic   Vitamin B12 deficiency    Lab Results  Component Value Date   VITAMINB12 220 08/31/2010   started monthly IM replacement 08/2010 for same - ?contribution to "pseduodementia" symptoms Overall improved -  Changed to oral replacement at pt preference and stopped stop IM recheck level now and resume IM replacement as needed    Relevant Orders      Ambulatory referral to Neurology      Vitamin B12      Time spent with pt/family today 45 minutes, greater than 50% time spent counseling patient on cognitive  dysfx, medical issues and medication review. Also review of prior records

## 2014-06-17 NOTE — Progress Notes (Signed)
Pre visit review using our clinic review tool, if applicable. No additional management support is needed unless otherwise documented below in the visit note. 

## 2014-06-17 NOTE — Patient Instructions (Signed)
It was good to see you today.  We have reviewed your prior records including labs and tests today  Test(s) ordered today. Your results will be released to [place] (or called to you) after review, usually within 72hours after test completion. If any changes need to be made, you will be notified at that same time.  Medications reviewed and updated, no changes recommended at this time.  we'll make referral to Dr. Krista Blue with neurology. Our office will contact you regarding appointment(s) once made.  Please schedule followup in 6 weeks, call sooner if problems.

## 2014-06-17 NOTE — Assessment & Plan Note (Signed)
BP Readings from Last 3 Encounters:  06/17/14 162/90  02/05/14 128/70  12/06/13 144/94   Intol of HCTZ due to ?allg (sulfa) On amlodipine, generally at goal when compliant, but reviewed recent noncompliance due to cognitive dysfunction Increased dose amlodipine 11/2013 resulted in symptomatic overtx - so takes 1/2 of 10mg  unless SBP>140 Patient will resume medication, monitoring blood pressure and call if uncontrolled Patient's niece winter patient uses pillbox to verify compliance

## 2014-06-18 LAB — LIPID PANEL
CHOL/HDL RATIO: 3
Cholesterol: 226 mg/dL — ABNORMAL HIGH (ref 0–200)
HDL: 89 mg/dL (ref >39.00)
LDL CALC: 125 mg/dL — AB (ref 0–99)
NonHDL: 137
Triglycerides: 62 mg/dL (ref 0.0–149.0)
VLDL: 12.4 mg/dL (ref 0.0–40.0)

## 2014-06-18 LAB — HEPATIC FUNCTION PANEL
ALT: 14 U/L (ref 0–35)
AST: 21 U/L (ref 0–37)
Albumin: 4.1 g/dL (ref 3.5–5.2)
Alkaline Phosphatase: 86 U/L (ref 39–117)
BILIRUBIN DIRECT: 0.1 mg/dL (ref 0.0–0.3)
BILIRUBIN TOTAL: 0.5 mg/dL (ref 0.2–1.2)
Total Protein: 7.1 g/dL (ref 6.0–8.3)

## 2014-06-18 LAB — BASIC METABOLIC PANEL
BUN: 10 mg/dL (ref 6–23)
CALCIUM: 9 mg/dL (ref 8.4–10.5)
CO2: 26 mEq/L (ref 19–32)
CREATININE: 0.6 mg/dL (ref 0.4–1.2)
Chloride: 98 mEq/L (ref 96–112)
GFR: 97.03 mL/min (ref >60.00)
Glucose, Bld: 92 mg/dL (ref 70–99)
POTASSIUM: 4.1 meq/L (ref 3.5–5.1)
Sodium: 134 mEq/L — ABNORMAL LOW (ref 135–145)

## 2014-06-19 ENCOUNTER — Telehealth: Payer: Self-pay

## 2014-06-19 NOTE — Telephone Encounter (Signed)
Pt called back. Was given test results and scheduled nurse visit for B12

## 2014-06-24 ENCOUNTER — Ambulatory Visit (INDEPENDENT_AMBULATORY_CARE_PROVIDER_SITE_OTHER): Payer: Medicare Other | Admitting: *Deleted

## 2014-06-24 DIAGNOSIS — E538 Deficiency of other specified B group vitamins: Secondary | ICD-10-CM

## 2014-06-24 MED ORDER — CYANOCOBALAMIN 1000 MCG/ML IJ SOLN
1000.0000 ug | Freq: Once | INTRAMUSCULAR | Status: AC
  Administered 2014-06-24: 1000 ug via INTRAMUSCULAR

## 2014-07-24 ENCOUNTER — Ambulatory Visit (INDEPENDENT_AMBULATORY_CARE_PROVIDER_SITE_OTHER): Payer: Medicare Other | Admitting: *Deleted

## 2014-07-24 ENCOUNTER — Ambulatory Visit: Payer: Self-pay

## 2014-07-24 DIAGNOSIS — E538 Deficiency of other specified B group vitamins: Secondary | ICD-10-CM

## 2014-07-24 MED ORDER — CYANOCOBALAMIN 1000 MCG/ML IJ SOLN
1000.0000 ug | Freq: Once | INTRAMUSCULAR | Status: AC
  Administered 2014-07-24: 1000 ug via INTRAMUSCULAR

## 2014-07-31 ENCOUNTER — Ambulatory Visit: Payer: Medicare Other | Admitting: Neurology

## 2014-07-31 ENCOUNTER — Ambulatory Visit (INDEPENDENT_AMBULATORY_CARE_PROVIDER_SITE_OTHER): Payer: Medicare Other | Admitting: *Deleted

## 2014-07-31 ENCOUNTER — Ambulatory Visit: Payer: Medicare Other

## 2014-07-31 DIAGNOSIS — E538 Deficiency of other specified B group vitamins: Secondary | ICD-10-CM

## 2014-07-31 MED ORDER — CYANOCOBALAMIN 1000 MCG/ML IJ SOLN
1000.0000 ug | Freq: Once | INTRAMUSCULAR | Status: AC
  Administered 2014-07-31: 1000 ug via INTRAMUSCULAR

## 2014-08-07 ENCOUNTER — Ambulatory Visit: Payer: Medicare Other

## 2014-08-11 ENCOUNTER — Encounter: Payer: Medicare Other | Admitting: Internal Medicine

## 2014-08-20 ENCOUNTER — Ambulatory Visit: Payer: Medicare Other

## 2014-09-01 ENCOUNTER — Ambulatory Visit: Payer: Medicare Other | Admitting: Neurology

## 2014-09-01 ENCOUNTER — Encounter: Payer: Medicare Other | Admitting: Internal Medicine

## 2014-09-02 ENCOUNTER — Ambulatory Visit: Payer: Medicare Other | Admitting: Neurology

## 2014-09-03 ENCOUNTER — Encounter: Payer: Self-pay | Admitting: Neurology

## 2014-09-03 ENCOUNTER — Ambulatory Visit (INDEPENDENT_AMBULATORY_CARE_PROVIDER_SITE_OTHER): Payer: Medicare Other | Admitting: Neurology

## 2014-09-03 VITALS — BP 161/79 | HR 63 | Ht 66.0 in | Wt 151.0 lb

## 2014-09-03 DIAGNOSIS — R413 Other amnesia: Secondary | ICD-10-CM

## 2014-09-03 MED ORDER — DONEPEZIL HCL 10 MG PO TABS: 10.0000 mg | ORAL_TABLET | Freq: Every day | ORAL | Status: DC

## 2014-09-03 NOTE — Progress Notes (Signed)
HPI: [name] Fox is a 74 years old right-handed Caucasian female, accompanied by her husband, referred by rheumatologist Dr. Ouida Sills, and her primary care physician Dr. Asa Lente evaluation of short-term memory trouble.  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 She quit driving for month ago, because noticed worsening short-term memory trouble, slow reaction time with her most recent diagnosis of arthritis  Her older brothers at age 68, 68, suffered mild memory loss, her father died at age 19 of pancreatic cancer, mother died at age 40 of infection  Recent laboratory showed LDL 112, previous cholesterol 290, normal TSH 1.15, mild elevated alkaline phosphatase at 156, low sodium 133, glucose 158  MRI of the brain showed mild atrophy, no significant change compared to previous study in 2012, MRA of the brain was normal,  She complains of excessive snoring, fatigue, moderate sleepiness, ESS score is 9, FSS is 50, she also complains of early morning headaches, denied previous history of headaches, no visual change    She was found to have elevated ESR 70, B12 220, she was referred to rheumatologist, diagnosed with polymyalgia rheumatica, she was put on  higher dose of prednisone, 20 mg every day Jan 2014, now tapering dose to 10 mg every day, which has helped her diffuse body aching pain, she continued to be frustrated by her short-term memory trouble, she was started on Zoloft, and Aricept, without a significant improvement, she has quit driving for 2 months, is not exercising regularly  UPDATE April 18th 2014: She had neuropsychiatric evaluation by Dr. Vikki Ports, most consistent with diagnosis of mild cognitive impairment, there is significant for concern of neurodegenerative condition such as Alzheimer's disease, particularly giving evidence of medial temporal, and the hippocampal dysfunction, Mini-Mental Status was 25 out of 30, markedly impaired unstructured verbal free recall, and recognition, Delayed contextual free recall, reduced nonverbal learning and memory, there was no significant signs of depression or  Anxiety.  UPDATE Feb 17th 2016: She went through divorce with her husband, she is no longer taking Namenda, she attributed a lot of her memory issue to her stress, she is still taking Aricept 10 mg every day, there was no significant worsening of her memory trouble, her son drove her to visit today.   Review of Systems  Out of a complete 14 system review, the patient complains of only the following symptoms, and all other reviewed systems are negative.  As above   Physical Exam  Neck: supple no carotid bruits Respiratory: clear to auscultation bilaterally Cardiovascular: regular rate rhythm  Neurologic Exam  Mental Status: pleasant, awake, alert, cooperative to history, talking, and casual conversation. MMSE 26/30, she is not oriented to date,  missed 1/3 recalls,  Animal naming 14. Cranial Nerves: CN II-XII pupils were  equal round reactive to light.  Fundi were sharp bilaterally.  Extraocular movements were full.  Visual fields were full on confrontational test.  Facial sensation and strength were normal.  Hearing was intact to  finger rubbing bilaterally.  Uvula tongue were midline.  Head turning and shoulder shrugging were normal and symmetric.  Tongue protrusion into the cheeks strength were normal.  Motor: Normal tone, bulk, and strength. Sensory: Normal to light touch, pinprick, proprioception, and vibratory sensation. Coordination: Normal finger-to-nose, heel-to-shin.  There was no dysmetria noticed. Gait and Station: Narrow based and steady, was able to perform tiptoe, heel, and tandem walking without difficulty.  Romberg sign: Negative Reflexes: Deep tendon reflexes: Biceps: 2/2, Brachioradialis: 2/2, Triceps: 2/2, Pateller: 2/2, Achilles: 2/2.  Plantar responses are flexor.   Assessment and Plan: 74 years old Caucasian female, with gradual onset short-term memory trouble,   MMSE 26/30.  1. Mild cognitive impairment, stress related vs early central nervous system degenerative disorder. 2, Keep aricept 48m daily, continue Zoloft 100 mg daily, encouraged her moderate exercise 3. RTC in 6 months,     YMarcial Pacas M.D. Ph.D.  [facility] [address] [place] [id number]: [phone]:      [phone]

## 2014-09-09 ENCOUNTER — Other Ambulatory Visit: Payer: Self-pay | Admitting: Internal Medicine

## 2014-09-17 ENCOUNTER — Telehealth: Payer: Self-pay | Admitting: *Deleted

## 2014-09-17 NOTE — Telephone Encounter (Signed)
Left msg on triage stating she is out of her sertraline needing a bridge refill until see md. Called pt back no answer LMOM per chart refill was sent to [facility] for #90 with 1 additional refill on 09/09/14. Please contact pharmacy to check status...[name] Fox

## 2014-09-22 ENCOUNTER — Ambulatory Visit (INDEPENDENT_AMBULATORY_CARE_PROVIDER_SITE_OTHER): Payer: Medicare Other | Admitting: Internal Medicine

## 2014-09-22 ENCOUNTER — Ambulatory Visit (INDEPENDENT_AMBULATORY_CARE_PROVIDER_SITE_OTHER)
Admission: RE | Admit: 2014-09-22 | Discharge: 2014-09-22 | Disposition: A | Discharge status: Home / Self Care | Payer: Medicare Other | Source: Ambulatory Visit | Attending: Internal Medicine | Admitting: Internal Medicine

## 2014-09-22 ENCOUNTER — Other Ambulatory Visit: Payer: Medicare Other

## 2014-09-22 ENCOUNTER — Encounter: Payer: Self-pay | Admitting: Internal Medicine

## 2014-09-22 VITALS — BP 178/88 | HR 64 | Temp 97.5°F | Resp 16 | Ht 66.0 in | Wt 148.0 lb

## 2014-09-22 DIAGNOSIS — E538 Deficiency of other specified B group vitamins: Secondary | ICD-10-CM

## 2014-09-22 DIAGNOSIS — M8580 Other specified disorders of bone density and structure, unspecified site: Secondary | ICD-10-CM

## 2014-09-22 DIAGNOSIS — Z23 Encounter for immunization: Secondary | ICD-10-CM

## 2014-09-22 DIAGNOSIS — F0390 Unspecified dementia without behavioral disturbance: Secondary | ICD-10-CM

## 2014-09-22 DIAGNOSIS — I10 Essential (primary) hypertension: Secondary | ICD-10-CM

## 2014-09-22 MED ORDER — CYANOCOBALAMIN 1000 MCG/ML IJ SOLN
1000.0000 ug | Freq: Once | INTRAMUSCULAR | Status: AC
  Administered 2014-09-22: 1000 ug via INTRAMUSCULAR

## 2014-09-22 MED ORDER — AMLODIPINE BESYLATE 5 MG PO TABS: 5.0000 mg | ORAL_TABLET | Freq: Every day | ORAL | Status: DC

## 2014-09-22 MED ORDER — LOSARTAN POTASSIUM 50 MG PO TABS: 50.0000 mg | ORAL_TABLET | Freq: Every day | ORAL | Status: DC

## 2014-09-22 NOTE — Assessment & Plan Note (Signed)
DEXA @ [facility] 08/2009: -1.7 On Fosamax therapy in addition to calcium, vitamin D and weightbearing exercises Recheck DEXA now, adjust therapy as needed

## 2014-09-22 NOTE — Assessment & Plan Note (Signed)
Labs reviewed and MRI brain 10/2011 Slowly progressive and intermittent flare of symptoms -  S/p eval 02/2012 by neuro - felt consistent with mild dementia, confirmed on spring 2014 neuropsyc eval>mild cognitive dysfunction started aricept 5mg  qhs 06/2012, titrated up 10/2012 when ineligible for study trial 10/2012 Added namenda 01/2013, off same since 04/2013 due to national shortage on XR, then resumed - but 01/2014 stopped same as felt no longer needed as memory had improved with "less stress" since spouse moved out 11/2013 ("psuedodementia overlap") Divorce proceedings ongoing and reviewed Reviewed 08/2014 follow up with neuro for updated assessment 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

## 2014-09-22 NOTE — Assessment & Plan Note (Signed)
BP Readings from Last 3 Encounters:  09/22/14 178/88  09/03/14 161/79  06/17/14 162/90   Intol of HCTZ due to ?allg (sulfa) On amlodipine, generally at goal when compliant, but reviewed frequent noncompliance due to cognitive dysfunction Previously increased dose amlodipine to 10mg  qd 11/2013 resulted in symptomatic overtx - so takes only 5 mg unless SBP>140 Add ARB now - we reviewed potential risk/benefit and possible side effects - pt understands and agrees to same  Emphasized need to monitor blood pressure and call if uncontrolled Patient's niece secured use of pillbox to verify compliance 06/2014

## 2014-09-22 NOTE — Patient Instructions (Addendum)
It was good to see you today.  We have reviewed your prior records including labs and tests today  DEXA (bone density) test scheduled today. Your results will be released to [place] (or called to you) after review, usually within 72hours after test completion. If any changes need to be made, you will be notified at that same time.  Medications reviewed and updated Take full tablet of amlodipine 5 mg daily along with new prescription for losartan 50 mg once daily to control blood pressure No other changes recommended at this time.  Your prescription(s) have been submitted to your pharmacy. Please take as directed and contact our office if you believe you are having problem(s) with the medication(s).  Please schedule followup in 3-4 months to recheck blood pressure, call sooner if problems.

## 2014-09-22 NOTE — Progress Notes (Signed)
Subjective:    Patient ID: [name] Fox, female    DOB: Dec 10, 1940, 74 y.o.   MRN: [id number]  HPI  Patient here for follow-up. Reviewed chronic medical issues and interval medical events  Past Medical History  Diagnosis Date  . HERPES ZOSTER   . HEARING LOSS, BILATERAL   . OSTEOPENIA   . VITAMIN B12 DEFICIENCY   . ALLERGIC RHINITIS   . ANXIETY   . DEGENERATIVE JOINT DISEASE, KNEES, BILATERAL   . DYSLIPIDEMIA   . PMR (polymyalgia rheumatica) dx 10/2011    Initially dx GCA, then PMR clarified -on pred, follows with rheum  . Dementia 08/31/2010    neuropsyc eval 10/2012 and follows with neuro  . Fibrocystic breast disease   . Diverticulosis   . Hemorrhoids   . Adenomatous colon polyp 05/26/1999    procedure at [facility] in [place], [place]    Review of Systems  Constitutional: Positive for fatigue. Negative for unexpected weight change.  Respiratory: Negative for cough and shortness of breath.   Cardiovascular: Negative for chest pain and leg swelling.  Psychiatric/Behavioral: Positive for decreased concentration. Negative for sleep disturbance, dysphoric mood and agitation.       Objective:    Physical Exam  Constitutional: She appears well-developed and well-nourished. No distress.  Cardiovascular: Normal rate, regular rhythm and normal heart sounds.   No murmur heard. Pulmonary/Chest: Effort normal and breath sounds normal. No respiratory distress.  Musculoskeletal: She exhibits no edema.    BP 178/88 mmHg  Pulse 64  Temp(Src) 97.5 F (36.4 C) (Oral)  Resp 16  Ht 5\' 6"  (1.676 m)  Wt 148 lb (67.132 kg)  BMI 23.90 kg/m2  SpO2 96% Wt Readings from Last 3 Encounters:  09/22/14 148 lb (67.132 kg)  09/03/14 151 lb (68.493 kg)  06/17/14 153 lb 8 oz (69.627 kg)     Lab Results  Component Value Date   WBC 5.9 06/17/2014   HGB 13.1 06/17/2014   HCT 39.2 06/17/2014   PLT 361.0 06/17/2014   GLUCOSE 92 06/17/2014   CHOL 226* 06/17/2014   TRIG 62.0  06/17/2014   HDL 89.00 06/17/2014   LDLDIRECT 114.9 02/05/2013   LDLCALC 125* 06/17/2014   ALT 14 06/17/2014   AST 21 06/17/2014   NA 134* 06/17/2014   K 4.1 06/17/2014   CL 98 06/17/2014   CREATININE 0.6 06/17/2014   BUN 10 06/17/2014   CO2 26 06/17/2014   TSH 2.28 06/17/2014    Ct Abdomen Pelvis W Contrast  04/09/2013   CLINICAL DATA:  Abnormal unintentional weight loss. Diarrhea. Family history of pancreatic carcinoma.  EXAM: CT ABDOMEN AND PELVIS WITH CONTRAST  TECHNIQUE: Multidetector CT imaging of the abdomen and pelvis was performed using the standard protocol following bolus administration of intravenous contrast.  CONTRAST:  100 cc Omnipaque 300 and oral contrast  COMPARISON:  None.  FINDINGS: The liver, gallbladder, pancreas, spleen, adrenal glands, and both kidneys are normal in appearance. No evidence of hydronephrosis.  Uterus and adnexal regions are unremarkable. No soft tissue masses or lymphadenopathy identified within the abdomen or pelvis. No evidence of inflammatory process or abnormal fluid collections. No evidence of dilated bowel loops or hernia. Mild diverticulosis is seen involving the left colon, however there is no evidence of diverticulitis.  IMPRESSION: No acute findings.  Diverticulosis. No radiographic evidence of diverticulitis.   Electronically Signed   By: Earle Gell   On: 04/09/2013 15:45       Assessment & Plan:  Problem List Items Addressed This Visit    Dementia    Labs reviewed and MRI brain 10/2011 Slowly progressive and intermittent flare of symptoms -  S/p eval 02/2012 by neuro - felt consistent with mild dementia, confirmed on spring 2014 neuropsyc eval>mild cognitive dysfunction started aricept 5mg  qhs 06/2012, titrated up 10/2012 when ineligible for study trial 10/2012 Added namenda 01/2013, off same since 04/2013 due to national shortage on XR, then resumed - but 01/2014 stopped same as felt no longer needed as memory had improved with "less  stress" since spouse moved out 11/2013 ("psuedodementia overlap") Divorce proceedings ongoing and reviewed Reviewed 08/2014 follow up with neuro for updated assessment 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       Hypertension - Primary    BP Readings from Last 3 Encounters:  09/22/14 178/88  09/03/14 161/79  06/17/14 162/90   Intol of HCTZ due to ?allg (sulfa) On amlodipine, generally at goal when compliant, but reviewed frequent noncompliance due to cognitive dysfunction Previously increased dose amlodipine to 10mg  qd 11/2013 resulted in symptomatic overtx - so takes only 5 mg unless SBP>140 Add ARB now - we reviewed potential risk/benefit and possible side effects - pt understands and agrees to same  Emphasized need to monitor blood pressure and call if uncontrolled Patient's niece secured use of pillbox to verify compliance 06/2014      Relevant Medications   losartan (COZAAR) tablet   amLODIpine (NORVASC) tablet   Osteopenia    DEXA @ [facility] 08/2009: -1.7 On Fosamax therapy in addition to calcium, vitamin D and weightbearing exercises Recheck DEXA now, adjust therapy as needed      Relevant Orders   DG Bone Density    Other Visit Diagnoses    Need for prophylactic vaccination with tetanus toxoid alone        Relevant Orders    Td vaccine greater than or equal to 7yo preservative free IM    B12 deficiency        Relevant Medications    cyanocobalamin ((VITAMIN B-12)) injection 1,000 mcg        Gwendolyn Grant, MD

## 2014-09-22 NOTE — Progress Notes (Signed)
Pre visit review using our clinic review tool, if applicable. No additional management support is needed unless otherwise documented below in the visit note. 

## 2014-09-23 ENCOUNTER — Telehealth: Payer: Self-pay | Admitting: Internal Medicine

## 2014-09-23 NOTE — Telephone Encounter (Signed)
emmi emailed °

## 2014-09-26 ENCOUNTER — Encounter: Payer: Self-pay | Admitting: Internal Medicine

## 2014-09-29 ENCOUNTER — Other Ambulatory Visit: Payer: Self-pay | Admitting: Internal Medicine

## 2014-11-03 ENCOUNTER — Telehealth: Payer: Self-pay

## 2014-11-03 NOTE — Telephone Encounter (Signed)
Patient called to educate on Medicare Wellness apt. LVM for the patient to call back to educate and schedule for wellness visit.  (to note; mbr has ? Dementia and being evaluated by neurology; May be helpful for LTC planning; ad etc.)

## 2014-11-14 ENCOUNTER — Encounter: Payer: Self-pay | Admitting: Internal Medicine

## 2014-11-24 ENCOUNTER — Other Ambulatory Visit: Payer: Self-pay | Admitting: Internal Medicine

## 2014-12-16 ENCOUNTER — Telehealth: Payer: Self-pay | Admitting: Internal Medicine

## 2014-12-16 NOTE — Telephone Encounter (Signed)
Patient Name: [name] Fox  DOB: [birth year]    Initial Comment caller states she has a painful sore on the inside of her leg, near her rectum   Nurse Assessment  Nurse: Ronnald Ramp, RN, [place] Date/Time (Eastern Time): 12/16/2014 9:24:18 AM  Confirm and document reason for call. If symptomatic, describe symptoms. ---Caller states she has a painful sore in her groin crease for the last 3 days. The scab has opened and bled with pus draining.  Has the patient traveled out of the country within the last 30 days? ---Not Applicable  Does the patient require triage? ---Yes  Related visit to physician within the last 2 weeks? ---No  Does the PT have any chronic conditions? (i.e. diabetes, asthma, etc.) ---Yes  List chronic conditions. ---High Cholesterol,     Guidelines    Guideline Title Affirmed Question Affirmed Notes  Boil (Skin Abscess) Boil < 1/2 inch across (< 12 mm; smaller than a marble) (all triage questions negative)    Final Disposition User   Home Care Ronnald Ramp, RN, [employer]

## 2014-12-17 ENCOUNTER — Telehealth: Payer: Self-pay | Admitting: *Deleted

## 2014-12-17 NOTE — Telephone Encounter (Signed)
[facility] Patient Name: ZOEJANE GAULIN Gender: Female DOB: 09/29/1940 Age: 74 Y 2 M 16 D Return Phone Number: [phone] (Primary), [phone] (Secondary) Address: City/State/Zip: [place] Client [facility] - Client Client Site [place] - Day Physician [place], [place] Type Call Call Type Triage / Clinical Relationship To Patient Self Appointment Disposition EMR Appointment Not Necessary Info pasted into Epic Yes Return Phone Number [phone] (Primary) Chief Complaint Sores Initial Comment caller states she has a painful sore on the inside of her leg, near her rectum PreDisposition Call Doctor Nurse Assessment Nurse: Ronnald Ramp, RN, Miranda Date/Time (Eastern Time): 12/16/2014 9:24:18 AM Confirm and document reason for call. If symptomatic, describe symptoms. ---Caller states she has a painful sore in her groin crease for the last 3 days. The scab has opened and bled with pus draining. Has the patient traveled out of the country within the last 30 days? ---Not Applicable Does the patient require triage? ---Yes Related visit to physician within the last 2 weeks? ---No Does the PT have any chronic conditions? (i.e. diabetes, asthma, etc.) ---Yes List chronic conditions. ---High Cholesterol, Guidelines Guideline Title Affirmed Question Affirmed Notes Nurse Date/Time (Eastern Time) Boil (Skin Abscess) Boil < 1/2 inch across (< 12 mm; smaller than a marble) (all triage questions negative) Ronnald Ramp, RN, Miranda 12/16/2014 9:25:55 AM Disp. Time Eilene Ghazi Time) Disposition Final User 12/16/2014 9:29:45 AM Home Care Yes Ronnald Ramp, RN, [facility] Understands: Yes Disagree/Comply: Comply PLEASE NOTE: All timestamps contained within this report are represented as [place] Standard Time. CONFIDENTIALTY NOTICE: This fax transmission is intended only for the addressee.  It contains information that is legally privileged, confidential or otherwise protected from use or disclosure. If you are not the intended recipient, you are strictly prohibited from reviewing, disclosing, copying using or disseminating any of this information or taking any action in reliance on or regarding this information. If you have received this fax in error, please notify us immediately by telephone so that we can arrange for its return to [place]. Phone: [phone], Toll-Free: [phone], Fax: [phone] Page: 2 of 2 Call Id: [id number] Care Advice Given Per Guideline HOME CARE: You should be able to treat this at home. REASSURANCE: * A boil is an infection of a hair follicle. There is usually pain, redness, and a small area of swelling. * Small boils can usually be treated home. TREATMENT FOR A BOIL - APPLY ANTIBIOTIC OINTMENT: Apply an over-the-counter antibiotic ointment (e.g., Bacitracin) to the area of redness three times daily. DRAINING PUS - IT IS CONTAGIOUS: Pus or other drainage from an open boil is very contagious (you can spread it to others). TREATMENT FOR A BOIL - APPLY MOIST HEAT: * If the boil drains pus: continue to apply a warm wet washcloth to the boil 3 times a day for three more days. DRAINING PUS - PREVENTING SPREAD TO YOURSELF AND OTHERS: * Wash the boil with soap and water each day. * Keep the boil covered with a clean dry dressing (e.g., gauze pad and tape). Throw the dirty dressings into the regular trash. * Make certain to wash your hands with soap and water after changing the dressing. * Shower daily with an antibacterial soap. Allow the soap to remain on your skin for five minutes before rinsing. Showers are best because baths still leave many Staph bacteria on the skin. * Use a clean towel daily. * Launder any clothes, sheets, and  towels that become contaminated with drainage. EXPECTED COURSE: * REDNESS GOES AWAY: Sometimes, the redness and pain simply disappears  after 3-7 days. * PUS DRAINS SPONTANEOUSLY: Sometimes the body walls off the Staph infection and the center of the boil becomes soft (filled with pus). The overlying skin then develops a pimple or becomes pus-colored ('comes to a head'). The pus may suddenly drain by itself; you may need to apply moist heat for 3-4 days before this occurs. Once a boil opens it will drain pus for 2 to 3 days and then should heal up. * INCISION AND DRAINAGE: Sometimes a boil needs to be cut open (incision and drainage) by a doctor to let out the pus. This is needed for larger boils, boils that are not getting better, and when there is obvious pus that is not draining out. CALL BACK IF: * Severe pain or fever occurs * Widespread rash occurs * Redness spreads beyond the boil * No improvement after 3 days * You become worse. CARE ADVICE per Boil and Abscess (Adult) guideline. TREATMENT FOR A BOIL - GENERAL: * Do not squeeze a boil (Reason: This can push bacteria deeper into the skin). * Avoid touching or scratching the boil. * Keep your hands clean. Wash them with soap and water. After Care Instructions Given Call Event Type User Date / Time Description

## 2015-02-02 ENCOUNTER — Ambulatory Visit (INDEPENDENT_AMBULATORY_CARE_PROVIDER_SITE_OTHER): Payer: Medicare Other | Admitting: Internal Medicine

## 2015-02-02 ENCOUNTER — Encounter: Payer: Self-pay | Admitting: Internal Medicine

## 2015-02-02 VITALS — BP 128/78 | HR 59 | Temp 98.0°F | Ht 66.0 in | Wt 149.5 lb

## 2015-02-02 DIAGNOSIS — I10 Essential (primary) hypertension: Secondary | ICD-10-CM | POA: Diagnosis not present

## 2015-02-02 DIAGNOSIS — R239 Unspecified skin changes: Secondary | ICD-10-CM | POA: Diagnosis not present

## 2015-02-02 DIAGNOSIS — E785 Hyperlipidemia, unspecified: Secondary | ICD-10-CM

## 2015-02-02 DIAGNOSIS — E538 Deficiency of other specified B group vitamins: Secondary | ICD-10-CM | POA: Diagnosis not present

## 2015-02-02 DIAGNOSIS — Z0000 Encounter for general adult medical examination without abnormal findings: Secondary | ICD-10-CM

## 2015-02-02 MED ORDER — CYANOCOBALAMIN 1000 MCG/ML IJ SOLN: 1000.0000 ug | INTRAMUSCULAR | Status: DC

## 2015-02-02 MED ORDER — CYANOCOBALAMIN 1000 MCG/ML IJ SOLN
1000.0000 ug | Freq: Once | INTRAMUSCULAR | Status: AC
  Administered 2015-02-02: 1000 ug via INTRAMUSCULAR

## 2015-02-02 MED ORDER — LOSARTAN POTASSIUM 50 MG PO TABS: 50.0000 mg | ORAL_TABLET | Freq: Every day | ORAL | Status: DC

## 2015-02-02 MED ORDER — AMLODIPINE BESYLATE 5 MG PO TABS: 5.0000 mg | ORAL_TABLET | Freq: Every day | ORAL | Status: DC

## 2015-02-02 NOTE — Assessment & Plan Note (Signed)
Lab Results  Component Value Date   [id number]* 06/17/2014   started monthly IM replacement 08/2010 for same - ?contribution to "pseduodementia" symptoms Overall improved -  Changed to oral replacement at pt preference and stopped stop IM in 2015, but not taking oral regularly Still low B12 last check 12/15 and not taking daily po replacement so will resume IM q30d replacement - office visits scheduled for same

## 2015-02-02 NOTE — Assessment & Plan Note (Signed)
BP Readings from Last 3 Encounters:  02/02/15 128/78  09/22/14 178/88  09/03/14 161/79   Intol of HCTZ due to ?allg (sulfa) On amlodipine and ARB (added 09/2014) generally at goal when compliant (improved noncompliance due to cognitive dysfunction with stress which has improved since separation and divorce finalized 10/2014) The current medical regimen is effective;  continue present plan and medications.

## 2015-02-02 NOTE — Progress Notes (Signed)
Pre visit review using our clinic review tool, if applicable. No additional management support is needed unless otherwise documented below in the visit note. 

## 2015-02-02 NOTE — Assessment & Plan Note (Signed)
On prava for same - tol well Check annually and titrate as needed - discussed goal LDL<100 given co-dx HTN The patient is asked to make an attempt to improve diet and exercise patterns to aid in medical management of this problem.

## 2015-02-02 NOTE — Patient Instructions (Addendum)
It was good to see you today.  We have reviewed your prior records including labs and tests today  Health Maintenance reviewed - all recommended immunizations and age-appropriate screenings are up-to-date.  Cholesterol test(s) ordered today to be done next month when you return for your B-12 shot. Your results will be released to [place] (or called to you) after review, usually within 72hours after test completion. If any changes need to be made, you will be notified at that same time.  Medications reviewed and updated, no changes recommended at this time.  Work on lifestyle changes as discussed (low fat, low carb, increased protein diet; improved exercise efforts; weight loss) to control sugar, blood pressure and cholesterol levels and/or reduce risk of developing other medical problems. Look into [email or web address].[email or web address] or other type of food journal to assist you in this process.  We'll schedule nurse visits for monthly B-12 injections as discussed. First injection provided today  we'll make referral to dermatology for skin check. Our office will contact you regarding appointment(s) once made.  Please schedule followup in 12 months for annual exam and labs, call sooner if problems.  Health Maintenance Adopting a healthy lifestyle and getting preventive care can go a long way to promote health and wellness. Talk with your health care provider about what schedule of regular examinations is right for you. This is a good chance for you to check in with your provider about disease prevention and staying healthy. In between checkups, there are plenty of things you can do on your own. Experts have done a lot of research about which lifestyle changes and preventive measures are most likely to keep you healthy. Ask your health care provider for more information. WEIGHT AND DIET  Eat a healthy diet  Be sure to include plenty of vegetables, fruits, low-fat dairy products, and lean protein.  Do not eat a  lot of foods high in solid fats, added sugars, or salt.  Get regular exercise. This is one of the most important things you can do for your health.  Most adults should exercise for at least 150 minutes each week. The exercise should increase your heart rate and make you sweat (moderate-intensity exercise).  Most adults should also do strengthening exercises at least twice a week. This is in addition to the moderate-intensity exercise.  Maintain a healthy weight  Body mass index (BMI) is a measurement that can be used to identify possible weight problems. It estimates body fat based on height and weight. Your health care provider can help determine your BMI and help you achieve or maintain a healthy weight.  For females 43 years of age and older:   A BMI below 18.5 is considered underweight.  A BMI of 18.5 to 24.9 is normal.  A BMI of 25 to 29.9 is considered overweight.  A BMI of 30 and above is considered obese.  Watch levels of cholesterol and blood lipids  You should start having your blood tested for lipids and cholesterol at 72 years of age, then have this test every 5 years.  You may need to have your cholesterol levels checked more often if:  Your lipid or cholesterol levels are high.  You are older than 72 years of age.  You are at high risk for heart disease.  CANCER SCREENING   Lung Cancer  Lung cancer screening is recommended for adults 58-63 years old who are at high risk for lung cancer because of a history of smoking.  A yearly low-dose CT  scan of the lungs is recommended for people who:  Currently smoke.  Have quit within the past 15 years.  Have at least a 30-pack-year history of smoking. A pack year is smoking an average of one pack of cigarettes a day for 1 year.  Yearly screening should continue until it has been 15 years since you quit.  Yearly screening should stop if you develop a health problem that would prevent you from having lung cancer  treatment.  Breast Cancer  Practice breast self-awareness. This means understanding how your breasts normally appear and feel.  It also means doing regular breast self-exams. Let your health care provider know about any changes, no matter how small.  If you are in your 20s or 30s, you should have a clinical breast exam (CBE) by a health care provider every 1-3 years as part of a regular health exam.  If you are 66 or older, have a CBE every year. Also consider having a breast X-ray (mammogram) every year.  If you have a family history of breast cancer, talk to your health care provider about genetic screening.  If you are at high risk for breast cancer, talk to your health care provider about having an MRI and a mammogram every year.  Breast cancer gene (BRCA) assessment is recommended for women who have family members with BRCA-related cancers. BRCA-related cancers include:  Breast.  Ovarian.  Tubal.  Peritoneal cancers.  Results of the assessment will determine the need for genetic counseling and BRCA1 and BRCA2 testing. Cervical Cancer Routine pelvic examinations to screen for cervical cancer are no longer recommended for nonpregnant women who are considered low risk for cancer of the pelvic organs (ovaries, uterus, and vagina) and who do not have symptoms. A pelvic examination may be necessary if you have symptoms including those associated with pelvic infections. Ask your health care provider if a screening pelvic exam is right for you.   The Pap test is the screening test for cervical cancer for women who are considered at risk.  If you had a hysterectomy for a problem that was not cancer or a condition that could lead to cancer, then you no longer need Pap tests.  If you are older than 65 years, and you have had normal Pap tests for the past 10 years, you no longer need to have Pap tests.  If you have had past treatment for cervical cancer or a condition that could lead to  cancer, you need Pap tests and screening for cancer for at least 20 years after your treatment.  If you no longer get a Pap test, assess your risk factors if they change (such as having a new sexual partner). This can affect whether you should start being screened again.  Some women have medical problems that increase their chance of getting cervical cancer. If this is the case for you, your health care provider may recommend more frequent screening and Pap tests.  The human papillomavirus (HPV) test is another test that may be used for cervical cancer screening. The HPV test looks for the virus that can cause cell changes in the cervix. The cells collected during the Pap test can be tested for HPV.  The HPV test can be used to screen women 22 years of age and older. Getting tested for HPV can extend the interval between normal Pap tests from three to five years.  An HPV test also should be used to screen women of any age who have unclear  Pap test results.  After 72 years of age, women should have HPV testing as often as Pap tests.  Colorectal Cancer  This type of cancer can be detected and often prevented.  Routine colorectal cancer screening usually begins at 72 years of age and continues through 72 years of age.  Your health care provider may recommend screening at an earlier age if you have risk factors for colon cancer.  Your health care provider may also recommend using home test kits to check for hidden blood in the stool.  A small camera at the end of a tube can be used to examine your colon directly (sigmoidoscopy or colonoscopy). This is done to check for the earliest forms of colorectal cancer.  Routine screening usually begins at age 20.  Direct examination of the colon should be repeated every 5-10 years through 72 years of age. However, you may need to be screened more often if early forms of precancerous polyps or small growths are found. Skin Cancer  Check your skin  from head to toe regularly.  Tell your health care provider about any new moles or changes in moles, especially if there is a change in a mole's shape or color.  Also tell your health care provider if you have a mole that is larger than the size of a pencil eraser.  Always use sunscreen. Apply sunscreen liberally and repeatedly throughout the day.  Protect yourself by wearing long sleeves, pants, a wide-brimmed hat, and sunglasses whenever you are outside. HEART DISEASE, DIABETES, AND HIGH BLOOD PRESSURE   Have your blood pressure checked at least every 1-2 years. High blood pressure causes heart disease and increases the risk of stroke.  If you are between 84 years and 60 years old, ask your health care provider if you should take aspirin to prevent strokes.  Have regular diabetes screenings. This involves taking a blood sample to check your fasting blood sugar level.  If you are at a normal weight and have a low risk for diabetes, have this test once every three years after 72 years of age.  If you are overweight and have a high risk for diabetes, consider being tested at a younger age or more often. PREVENTING INFECTION  Hepatitis B  If you have a higher risk for hepatitis B, you should be screened for this virus. You are considered at high risk for hepatitis B if:  You were born in a country where hepatitis B is common. Ask your health care provider which countries are considered high risk.  Your parents were born in a high-risk country, and you have not been immunized against hepatitis B (hepatitis B vaccine).  You have HIV or AIDS.  You use needles to inject street drugs.  You live with someone who has hepatitis B.  You have had sex with someone who has hepatitis B.  You get hemodialysis treatment.  You take certain medicines for conditions, including cancer, organ transplantation, and autoimmune conditions. Hepatitis C  Blood testing is recommended for:  Everyone  born from [birth year] through 1963.  Anyone with known risk factors for hepatitis C. Sexually transmitted infections (STIs)  You should be screened for sexually transmitted infections (STIs) including gonorrhea and chlamydia if:  You are sexually active and are younger than 72 years of age.  You are older than 72 years of age and your health care provider tells you that you are at risk for this type of infection.  Your sexual activity has changed since  you were last screened and you are at an increased risk for chlamydia or gonorrhea. Ask your health care provider if you are at risk.  If you do not have HIV, but are at risk, it may be recommended that you take a prescription medicine daily to prevent HIV infection. This is called pre-exposure prophylaxis (PrEP). You are considered at risk if:  You are sexually active and do not regularly use condoms or know the HIV status of your partner(s).  You take drugs by injection.  You are sexually active with a partner who has HIV. Talk with your health care provider about whether you are at high risk of being infected with HIV. If you choose to begin PrEP, you should first be tested for HIV. You should then be tested every 3 months for as long as you are taking PrEP.  PREGNANCY   If you are premenopausal and you may become pregnant, ask your health care provider about preconception counseling.  If you may become pregnant, take 400 to 800 micrograms (mcg) of folic acid every day.  If you want to prevent pregnancy, talk to your health care provider about birth control (contraception). OSTEOPOROSIS AND MENOPAUSE   Osteoporosis is a disease in which the bones lose minerals and strength with aging. This can result in serious bone fractures. Your risk for osteoporosis can be identified using a bone density scan.  If you are 54 years of age or older, or if you are at risk for osteoporosis and fractures, ask your health care provider if you should be  screened.  Ask your health care provider whether you should take a calcium or vitamin D supplement to lower your risk for osteoporosis.  Menopause may have certain physical symptoms and risks.  Hormone replacement therapy may reduce some of these symptoms and risks. Talk to your health care provider about whether hormone replacement therapy is right for you.  HOME CARE INSTRUCTIONS   Schedule regular health, dental, and eye exams.  Stay current with your immunizations.   Do not use any tobacco products including cigarettes, chewing tobacco, or electronic cigarettes.  If you are pregnant, do not drink alcohol.  If you are breastfeeding, limit how much and how often you drink alcohol.  Limit alcohol intake to no more than 1 drink per day for nonpregnant women. One drink equals 12 ounces of beer, 5 ounces of wine, or 1 ounces of hard liquor.  Do not use street drugs.  Do not share needles.  Ask your health care provider for help if you need support or information about quitting drugs.  Tell your health care provider if you often feel depressed.  Tell your health care provider if you have ever been abused or do not feel safe at home. Document Released: 01/17/2011 Document Revised: 11/18/2013 Document Reviewed: 06/05/2013 [facility] Patient Information 2015 [place], [place]. This information is not intended to replace advice given to you by your health care provider. Make sure you discuss any questions you have with your health care provider.

## 2015-02-02 NOTE — Progress Notes (Signed)
Subjective:    Patient ID: [name] Fox, female    DOB: July 16, 1941, 74 y.o.   MRN: [id number]  HPI  Here for medicare wellness  Diet: heart healthy  Physical activity: sedentary Depression/mood screen: negative Hearing: intact to whispered voice Visual acuity: grossly normal, performs annual eye exam  ADLs: capable Fall risk: none Home safety: good Cognitive evaluation: intact to orientation, naming, recall and repetition EOL planning: adv directives, full code/ I agree  I have personally reviewed and have noted 1. The patient's medical and social history 2. Their use of alcohol, tobacco or illicit drugs 3. Their current medications and supplements 4. The patient's functional ability including ADL's, fall risks, home safety risks and hearing or visual impairment. 5. Diet and physical activities 6. Evidence for depression or mood disorders  Also reviewed chronic medical conditions, interval events and current concerns  Past Medical History  Diagnosis Date  . HERPES ZOSTER   . HEARING LOSS, BILATERAL   . OSTEOPENIA   . VITAMIN B12 DEFICIENCY   . ALLERGIC RHINITIS   . ANXIETY   . DEGENERATIVE JOINT DISEASE, KNEES, BILATERAL   . DYSLIPIDEMIA   . PMR (polymyalgia rheumatica) dx 10/2011    Initially dx [facility], then PMR clarified -on pred, follows with rheum  . Dementia 08/31/2010    neuropsyc eval 10/2012 and follows with neuro  . Fibrocystic breast disease   . Diverticulosis   . Hemorrhoids   . Adenomatous colon polyp 05/26/1999    procedure at [facility] in [place], [place]  . Osteoporosis     DEXA A LB 09/22/14: -2.4 R fem, progressive decline since 2011     Family History  Problem Relation Age of Onset  . Pancreatic cancer Father   . Heart disease Brother   . Colon cancer Paternal Grandfather   . Colon cancer Paternal Aunt   . Inflammatory bowel disease Cousin    History  Substance Use Topics  . Smoking status: Never Smoker   . Smokeless tobacco: Never  Used  . Alcohol Use: 1.8 oz/week    3 Glasses of wine per week     Comment: Occassional white wine    Review of Systems  Constitutional: Negative for fatigue and unexpected weight change.  Respiratory: Negative for cough, shortness of breath and wheezing.   Cardiovascular: Negative for chest pain, palpitations and leg swelling.  Gastrointestinal: Negative for nausea, abdominal pain and diarrhea.  Skin:       Growth with redness/irritation left anterior chest, ?removal  Neurological: Negative for dizziness, weakness, light-headedness and headaches.  Psychiatric/Behavioral: Negative for dysphoric mood. The patient is not nervous/anxious.   All other systems reviewed and are negative.   Patient Care Team: Rowe Clack, MD as PCP - General Collene Gobble, MD as Consulting Physician (Pulmonary Disease) Hurley Cisco, MD (Rheumatology) Marcial Pacas, MD (Neurology) Lafayette Dragon, MD (Gastroenterology)     Objective:    Physical Exam  Constitutional: She appears well-developed and well-nourished. No distress.  Cardiovascular: Normal rate, regular rhythm and normal heart sounds.   No murmur heard. Pulmonary/Chest: Effort normal and breath sounds normal. No respiratory distress.  Musculoskeletal: She exhibits no edema.  Skin:  SK with surrounding erythema (mild inflammation), 9 mm diameter and irregular texture flesh colored flat growth    BP 128/78 mmHg  Pulse 59  Temp(Src) 98 F (36.7 C) (Oral)  Ht 5\' 6"  (1.676 m)  Wt 149 lb 8 oz (67.813 kg)  BMI 24.14 kg/m2  SpO2  98% Wt Readings from Last 3 Encounters:  02/02/15 149 lb 8 oz (67.813 kg)  09/22/14 148 lb (67.132 kg)  09/03/14 151 lb (68.493 kg)    Lab Results  Component Value Date   WBC 5.9 06/17/2014   HGB 13.1 06/17/2014   HCT 39.2 06/17/2014   PLT 361.0 06/17/2014   GLUCOSE 92 06/17/2014   CHOL 226* 06/17/2014   TRIG 62.0 06/17/2014   HDL 89.00 06/17/2014   LDLDIRECT 114.9 02/05/2013   LDLCALC 125*  06/17/2014   ALT 14 06/17/2014   AST 21 06/17/2014   NA 134* 06/17/2014   K 4.1 06/17/2014   CL 98 06/17/2014   CREATININE 0.6 06/17/2014   BUN 10 06/17/2014   CO2 26 06/17/2014   TSH 2.28 06/17/2014   Lab Results  Component Value Date   VITAMINB12 162* 06/17/2014    Dg Bone Density  09/24/2014   Date of study: 09/22/2014 Exam: DUAL X-RAY ABSORPTIOMETRY (DXA) FOR BONE MINERAL DENSITY (BMD) Instrument: [employer] Requesting Provider: PCP Indication: follow up for low BMD Comparison: 08/20/2009 Clinical data: Pt is a postmenopausal 74 y.o. female without previous h/o  fracture. On Fosamax.  Results:  Lumbar spine (L1-L4) Femoral neck (FN)  T-score  - 1.1 RFN: - 2.4 LFN: - 2.1  Change in BMD from previous DXA test (%)  - 5.4%*  - 5.1%*  (*) statistically significant  Assessment: the BMD is low according to the [facility] classification for  osteoporosis (see below). Fracture risk: moderate FRAX score: 10 year major osteoporotic risk: 14.4%. 10 year hip fracture  risk: 3.8%. The thresholds for treatment are 20% and 3%, respectively. Comments: the technical quality of the study is good. Evaluation for secondary causes should be considered if clinically  indicated.  Recommend optimizing calcium (1200 mg/day) and vitamin D (800 IU/day)  intake.  Followup: Repeat BMD is appropriate after 2 years   WHO criteria for diagnosis of osteoporosis in postmenopausal women and in  men 61 y/o or older:  - normal: T-score -1.0 to + 1.0 - osteopenia/low bone density: T-score between -2.5 and -1.0 - osteoporosis: T-score below -2.5 - severe osteoporosis: T-score below -2.5 with history of fragility  fracture Note: although not part of the WHO classification, the presence of a  fragility fracture, regardless of the T-score, should be considered  diagnostic of osteoporosis, provided other causes for the fracture have  been excluded.  Treatment: The National Osteoporosis Foundation recommends that treatment  be considered  in postmenopausal women and men age 77 or older with: 1. Hip or vertebral (clinical or morphometric) fracture 2. T-score of - 2.5 or lower at the spine or hip 3. 10-year fracture probability by FRAX of at least 20% for a major  osteoporotic fracture and 3% for a hip fracture  Philemon Kingdom, MD [facility]          Assessment & Plan:   [id number]/z00.00 - Today patient counseled on age appropriate routine health concerns for screening and prevention, each reviewed and up to date or declined. Immunizations reviewed and up to date or declined. Labs reviewed. Risk factors for depression reviewed and negative. Hearing function and visual acuity are intact. ADLs screened and addressed as needed. Functional ability and level of safety reviewed and appropriate. Education, counseling and referrals performed based on assessed risks today. Patient provided with a copy of personalized plan for preventive services.  Inflamed SK. Refer to dermatology for evaluation, removal and general skin check  Problem List Items Addressed This  Visit    Dyslipidemia    On prava for same - tol well Check annually and titrate as needed - discussed goal LDL<100 given co-dx HTN The patient is asked to make an attempt to improve diet and exercise patterns to aid in medical management of this problem.       Relevant Orders   Lipid panel   Hypertension    BP Readings from Last 3 Encounters:  02/02/15 128/78  09/22/14 178/88  09/03/14 161/79   Intol of HCTZ due to ?allg (sulfa) On amlodipine and ARB (added 09/2014) generally at goal when compliant (improved noncompliance due to cognitive dysfunction with stress which has improved since separation and divorce finalized 10/2014) The current medical regimen is effective;  continue present plan and medications.      Relevant Medications   amLODipine (NORVASC) 5 MG tablet   losartan (COZAAR) 50 MG tablet   Vitamin B12 deficiency    Lab Results  Component Value Date     VITAMINB12 162* 06/17/2014   started monthly IM replacement 08/2010 for same - ?contribution to "pseduodementia" symptoms Overall improved -  Changed to oral replacement at pt preference and stopped stop IM in 2015, but not taking oral regularly Still low B12 last check 12/15 and not taking daily po replacement so will resume IM q30d replacement - office visits scheduled for same        Other Visit Diagnoses    Routine general medical examination at a health care facility    -  Primary    Skin change        Relevant Orders    Ambulatory referral to Dermatology        Gwendolyn Grant, MD

## 2015-02-03 ENCOUNTER — Ambulatory Visit: Payer: Medicare Other | Admitting: Internal Medicine

## 2015-03-09 ENCOUNTER — Ambulatory Visit (INDEPENDENT_AMBULATORY_CARE_PROVIDER_SITE_OTHER): Payer: Medicare Other

## 2015-03-09 ENCOUNTER — Other Ambulatory Visit (INDEPENDENT_AMBULATORY_CARE_PROVIDER_SITE_OTHER): Payer: Medicare Other

## 2015-03-09 DIAGNOSIS — E785 Hyperlipidemia, unspecified: Secondary | ICD-10-CM

## 2015-03-09 DIAGNOSIS — D649 Anemia, unspecified: Secondary | ICD-10-CM

## 2015-03-09 LAB — LIPID PANEL
CHOLESTEROL: 240 mg/dL — AB (ref 0–200)
HDL: 108.6 mg/dL (ref >39.00)
LDL Cholesterol: 121 mg/dL — ABNORMAL HIGH (ref 0–99)
NonHDL: 131.07
Total CHOL/HDL Ratio: 2
Triglycerides: 50 mg/dL (ref 0.0–149.0)
VLDL: 10 mg/dL (ref 0.0–40.0)

## 2015-03-09 MED ORDER — CYANOCOBALAMIN 1000 MCG/ML IJ SOLN
1000.0000 ug | Freq: Once | INTRAMUSCULAR | Status: AC
  Administered 2015-03-09: 1000 ug via INTRAMUSCULAR

## 2015-03-24 ENCOUNTER — Ambulatory Visit (INDEPENDENT_AMBULATORY_CARE_PROVIDER_SITE_OTHER): Payer: Medicare Other | Admitting: Neurology

## 2015-03-24 ENCOUNTER — Encounter: Payer: Self-pay | Admitting: Neurology

## 2015-03-24 VITALS — BP 158/76 | HR 67 | Ht 66.0 in | Wt 145.0 lb

## 2015-03-24 DIAGNOSIS — G3184 Mild cognitive impairment, so stated: Secondary | ICD-10-CM | POA: Diagnosis not present

## 2015-03-24 MED ORDER — MEMANTINE HCL 10 MG PO TABS: 10.0000 mg | ORAL_TABLET | Freq: Two times a day (BID) | ORAL | Status: DC

## 2015-03-24 NOTE — Progress Notes (Signed)
PATIENT: [name] Fox DOB: Feb 10, 1941  Chief Complaint  Patient presents with  . Memory Loss    MMSE 26/30 - 20 animals. She feels her memory has improved since her divorce has been settled.     HISTORICAL  [name] Fox is a 74 years old right-handed Caucasian female, referred by rheumatologist Dr. Ouida Sills, and her primary care physician Dr. Asa Lente evaluation of short-term memory trouble.  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 She quit driving for month ago, because noticed worsening short-term memory trouble, slow reaction time with her most recent diagnosis of arthritis  Her older brothers at age 61, 6, suffered mild memory loss, uncle at her father side also had severe dementia, her father died at age 56 of pancreatic cancer, mother died at age 27 of infection  Recent laboratory showed LDL 112, previous cholesterol 290, normal TSH 1.15, mild elevated alkaline phosphatase at 156, low sodium 133, glucose 158  MRI of the brain showed mild atrophy, no significant change compared to previous study in 2012, MRA of the brain was normal,  She complains of excessive snoring, fatigue, moderate sleepiness, ESS score is 9, FSS is 50, she  also complains of early morning headaches, denied previous history of headaches, no visual change    She was found to have elevated ESR 70, B12 220, she was referred to rheumatologist, diagnosed with polymyalgia rheumatica, she was put on higher dose of prednisone, 20 mg every day Jan 2014, now tapering dose to 10 mg every day, which has helped her diffuse body aching pain, she continued to be frustrated by her short-term memory trouble, she was started on Zoloft, and Aricept, without a significant improvement, she has quit driving for 2 months, is not exercising regularly  UPDATE April 18th 2014: She had neuropsychiatric evaluation by Dr. Vikki Ports, most consistent with diagnosis of mild cognitive impairment, there is significant for concern of neurodegenerative condition such as Alzheimer's disease, particularly giving evidence of medial temporal, and the hippocampal dysfunction, Mini-Mental Status was 25 out of 30, markedly impaired unstructured verbal free recall, and recognition, Delayed contextual free recall, reduced nonverbal learning and memory, there was no significant signs of depression or  Anxiety.  UPDATE Feb 17th 2016: She went through divorce with her husband, she is no longer taking Namenda, she attributed a lot of her memory issue to her stress, she is still taking Aricept 10 mg every day, there was no significant worsening of her memory trouble, her son drove her to visit today.  UPDATE Sept 6 2016: She went through divorce in 2015.  She lives at her home with her son, still driving, she is actively involved in community activity, she enjoys painting, exercise regularly She continue has mild  memory trouble, today's Mini-Mental status examination is 26 out of 30, she is able to tolerate Namenda 10 mg twice a day,   REVIEW OF SYSTEMS: Full 14 system review of systems performed and notable only for hearing loss  ALLERGIES: Allergies  Allergen Reactions  . Penicillins Shortness Of  Breath, Swelling and Rash  . Sulfasalazine Rash    HOME MEDICATIONS: Current Outpatient Prescriptions  Medication Sig Dispense Refill  . alendronate (FOSAMAX) 70 MG tablet Take 1 tablet (70 mg total) by mouth once a week. 4 tablet 5  . ALPRAZolam (XANAX) 0.5 MG tablet Take 0.5-1 tablets (0.25-0.5 mg total) by mouth every 6 (six) hours as needed for anxiety or sleep. 60 tablet 1  . amLODipine (NORVASC) 5 MG tablet Take 1 tablet (5 mg total) by mouth daily. 90 tablet 1  . cyanocobalamin (,VITAMIN B-12,) 1000 MCG/ML injection Inject 1 mL (1,000 mcg total) into the muscle every 30 (thirty) days. 1 mL 0  . dextromethorphan-guaiFENesin (MUCINEX DM) 30-600 MG per 12 hr tablet Take 1 tablet by mouth 2 (two) times daily as needed.    . donepezil (ARICEPT) 10 MG tablet Take 1 tablet (10 mg total) by mouth at bedtime. 30 tablet 11  . loratadine (CLARITIN) 10 MG tablet Take 10 mg by mouth daily.    Marland Kitchen losartan (COZAAR) 50 MG tablet Take 1 tablet (50 mg total) by mouth daily. 90 tablet 3  . Omega-3 Fatty Acids (FISH OIL) 1000 MG CAPS Take 1,000 mg by mouth daily.      . pravastatin (PRAVACHOL) 20 MG tablet Take 1 tablet (20 mg total) by mouth daily. 90 tablet 3  . sertraline (ZOLOFT) 100 MG tablet Take 1 tablet (100 mg total) by mouth daily. 90 tablet 1  . memantine (NAMENDA) 10 MG tablet Take 1 tablet (10 mg total) by mouth 2 (two) times daily. 60 tablet 11   No current facility-administered medications for this visit.    PAST MEDICAL HISTORY: Past Medical History  Diagnosis Date  . HERPES ZOSTER   . HEARING LOSS, BILATERAL   . OSTEOPENIA   . VITAMIN B12 DEFICIENCY   . ALLERGIC RHINITIS   . ANXIETY   . DEGENERATIVE JOINT DISEASE, KNEES, BILATERAL   . DYSLIPIDEMIA   . PMR (polymyalgia rheumatica) dx 10/2011    Initially dx GCA, then PMR clarified -on pred, follows with rheum  . Dementia 08/31/2010    neuropsyc eval 10/2012 and follows with neuro  . Fibrocystic breast disease   . Diverticulosis    . Hemorrhoids   . Adenomatous colon polyp 05/26/1999    procedure at [facility] in [place], [place]  . Osteoporosis     DEXA A LB 09/22/14: -2.4 R fem, progressive decline since 2011      PAST SURGICAL HISTORY: Past Surgical History  Procedure Laterality Date  . Bilateral mastectomy      while removing cosmetic silicone implants (NO Breast cancer)  . Bilateral silicone breast explant  1993    Due to rupture/leak @ [facility]    FAMILY HISTORY: Family History  Problem Relation Age of Onset  . Pancreatic cancer Father   . Heart disease Brother   . Colon cancer Paternal Grandfather   . Colon cancer Paternal Aunt   . Inflammatory bowel disease Cousin     SOCIAL HISTORY:  Social History   Social History  . Marital Status: Married    Spouse Name: N/A  . Number of Children: N/A  . Years of Education:  N/A   Occupational History  . Not on file.   Social History Main Topics  . Smoking status: Never Smoker   . Smokeless tobacco: Never Used  . Alcohol Use: 1.8 oz/week    3 Glasses of wine per week     Comment: Occassional white wine  . Drug Use: No  . Sexual Activity: Not on file   Other Topics Concern  . Not on file   Social History Narrative   Divorced fall 2015, lives alone. adopted son nearby. Retired from [employer]:   03/24/15 1143  BP: 158/76  Pulse: 67  Height: 5' 6"  (1.676 m)  Weight: 145 lb (65.772 kg)    Not recorded      Body mass index is 23.41 kg/(m^2).  PHYSICAL EXAMNIATION:  Gen: NAD, conversant, well nourised, obese, well groomed                     Cardiovascular: Regular rate rhythm, no peripheral edema, warm, nontender. Eyes: Conjunctivae clear without exudates or hemorrhage Neck: Supple, no carotid bruise. Pulmonary: Clear to auscultation bilaterally   NEUROLOGICAL EXAM:  MENTAL STATUS: Speech:    Speech is normal; fluent and spontaneous with normal comprehension.    Cognition:Mini-Mental Status Examination is 26 out of 30      Orientation; not oriented to time and place     Recent and remote memory: Missed one out of 3 recalls     Normal Attention span and concentration     Normal Language, naming, repeating,spontaneous speech     Fund of knowledge   CRANIAL NERVES: CN II: Visual fields are full to confrontation. Fundoscopic exam is normal with sharp discs and no vascular changes. Pupils are round equal and briskly reactive to light. CN III, IV, VI: extraocular movement are normal. No ptosis. CN V: Facial sensation is intact to pinprick in all 3 divisions bilaterally. Corneal responses are intact.  CN VII: Face is symmetric with normal eye closure and smile. CN VIII: Hearing is normal to rubbing fingers CN IX, X: Palate elevates symmetrically. Phonation is normal. CN XI: Head turning and shoulder shrug are intact CN XII: Tongue is midline with normal movements and no atrophy.  MOTOR: There is no pronator drift of out-stretched arms. Muscle bulk and tone are normal. Muscle strength is normal.  REFLEXES: Reflexes are 2+ and symmetric at the biceps, triceps, knees, and ankles. Plantar responses are flexor.  SENSORY: Intact to light touch, pinprick, position sense, and vibration sense are intact in fingers and toes.  COORDINATION: Rapid alternating movements and fine finger movements are intact. There is no dysmetria on finger-to-nose and heel-knee-shin.    GAIT/STANCE: Posture is normal. Gait is steady with normal steps, base, arm swing, and turning. Heel and toe walking are normal. Tandem gait is normal.  Romberg is absent.   DIAGNOSTIC DATA (LABS, IMAGING, TESTING) - I reviewed patient records, labs, notes, testing and imaging myself where available.   ASSESSMENT AND PLAN  [name] Fox is a 74 y.o. 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 Ph.D.  [facility] [address], [place], [place] [address] Ph: [phone] Fax: [phone]  CC: Referring Provider

## 2015-04-13 ENCOUNTER — Ambulatory Visit: Payer: Medicare Other

## 2015-05-04 ENCOUNTER — Encounter: Payer: Self-pay | Admitting: Internal Medicine

## 2015-05-04 ENCOUNTER — Ambulatory Visit (INDEPENDENT_AMBULATORY_CARE_PROVIDER_SITE_OTHER): Payer: Medicare Other | Admitting: Internal Medicine

## 2015-05-04 VITALS — BP 130/82 | HR 68 | Temp 97.8°F | Resp 16 | Wt 143.0 lb

## 2015-05-04 DIAGNOSIS — E785 Hyperlipidemia, unspecified: Secondary | ICD-10-CM

## 2015-05-04 DIAGNOSIS — R0789 Other chest pain: Secondary | ICD-10-CM | POA: Insufficient documentation

## 2015-05-04 DIAGNOSIS — Z23 Encounter for immunization: Secondary | ICD-10-CM | POA: Diagnosis not present

## 2015-05-04 DIAGNOSIS — F0390 Unspecified dementia without behavioral disturbance: Secondary | ICD-10-CM | POA: Diagnosis not present

## 2015-05-04 DIAGNOSIS — F411 Generalized anxiety disorder: Secondary | ICD-10-CM

## 2015-05-04 DIAGNOSIS — F329 Major depressive disorder, single episode, unspecified: Secondary | ICD-10-CM

## 2015-05-04 DIAGNOSIS — I10 Essential (primary) hypertension: Secondary | ICD-10-CM

## 2015-05-04 DIAGNOSIS — F32A Depression, unspecified: Secondary | ICD-10-CM

## 2015-05-04 NOTE — Assessment & Plan Note (Signed)
blood pressure controlled here today Continue current medications at current doses

## 2015-05-04 NOTE — Assessment & Plan Note (Signed)
Stable, controlled She is unsure if she is taking the xanax and will check at home Encouraged her to use only as needed if she is anxious since it can cause memory difficulties with long term use

## 2015-05-04 NOTE — Progress Notes (Signed)
Pre visit review using our clinic review tool, if applicable. No additional management support is needed unless otherwise documented below in the visit note. 

## 2015-05-04 NOTE — Patient Instructions (Addendum)
  We have reviewed your prior records including labs and tests today.  An ekg was done today and a stress test was ordered.    All other Health Maintenance issues reviewed.   All recommended immunizations and age-appropriate screenings are up-to-date.  Flu vaccine was administered today.   Medications reviewed and updated.  No changes recommended at this time.  If you have any chest pain prior to having the stress test done you should go to the ED for revaluation.

## 2015-05-04 NOTE — Assessment & Plan Note (Signed)
Lipids checked a couple of months ago and were stable Continue healthy diet and regular exercise Continue current dose of pravastatin has been depending on stress test may need to change statin

## 2015-05-04 NOTE — Progress Notes (Signed)
Subjective:    Patient ID: [name] Fox, female    DOB: 04-25-1941, 74 y.o.   MRN: [id number]  HPI  She is here for routine follow up of hypertension, high cholesterol and memory difficulties.  She is also concerned about chest pain she experienced last night.  Memory difficulties: She follows with neurology.  She takes aricept daily and namenda.  She was taking namenda once a day - it looks like it should be twice a day, but she did not realize that. When she started taking it twice daily she had stomach upset and stopped the medication completely. She plans on restarting it once a day.  Her memory has been stable according to neurology.  Chest tightness:  Last night she woke up with tightness in chest.  It occurred 2-3 times throughout the night.  It lasted a few minutes each time.  This morning she feels normal.  She switched to decaf - usually does half and half.  She denies associated symptoms including sob, heartburn, lightheadedness.  She may have heart racing during the episodes, but is not sure.  She denies prior episodes.  She loves to garden and that is her main exercise.  She does all the housework as well.  She has not had any symptoms with activity.  She is under a lot of stress since her divorce.   Depression, anxiety:  Takes sertraline daily and does not think she is taking the xanax. He feels her depression and anxiety are currently controlled. She is happy with her current dose of the sertraline.  Hypertension: She is taking her medication daily. She is compliant with a low sodium diet.  She denies palpitations, edema, shortness of breath and regular headaches. She is exercising regularly, mostly gardening, but does a lot of housework.    Hyperlipidemia: She is taking her medication daily. She is compliant with a low fat/cholesterol diet. She is very active/exercising regularly. She denies myalgias.     Medications and allergies reviewed with patient and updated if  appropriate.  Patient Active Problem List   Diagnosis Date Noted  . Memory loss 09/03/2014  . Numbness and tingling in right hand   . Hypertension 11/15/2013  . Weight loss, unintentional 04/12/2013  . Depression   . PMR (polymyalgia rheumatica) (HCC) 10/31/2011  . Vitamin B12 deficiency 08/31/2010  . ANXIETY 08/31/2010  . Dementia 08/31/2010  . Dyslipidemia 12/08/2009  . KELOID 11/19/2009  . HEARING LOSS, BILATERAL 08/20/2009  . 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 BRONCHIECTASIS WITHOUT ACUTE EXACERBATION 10/18/2007  . HERPES ZOSTER 09/10/2007  . ALLERGIC RHINITIS 04/06/2007  . FIBROCYSTIC BREAST DISEASE 04/06/2007  . Osteoporosis     Past Medical History  Diagnosis Date  . HERPES ZOSTER   . HEARING LOSS, BILATERAL   . OSTEOPENIA   . VITAMIN B12 DEFICIENCY   . ALLERGIC RHINITIS   . ANXIETY   . DEGENERATIVE JOINT DISEASE, KNEES, BILATERAL   . DYSLIPIDEMIA   . PMR (polymyalgia rheumatica) (HCC) dx 10/2011    Initially dx GCA, then PMR clarified -on pred, follows with rheum  . Dementia 08/31/2010    neuropsyc eval 10/2012 and follows with neuro  . Fibrocystic breast disease   . Diverticulosis   . Hemorrhoids   . Adenomatous colon polyp 05/26/1999    procedure at [facility] in [place], [place]  . Osteoporosis     DEXA A LB 09/22/14: -2.4 R fem, progressive decline since 2011  Past Surgical History  Procedure Laterality Date  . Bilateral mastectomy      while removing cosmetic silicone implants (NO Breast cancer)  . Bilateral silicone breast explant  1993    Due to rupture/leak @ [facility] [place] History  . Marital Status: Married    Spouse Name: N/A  . Number of Children: N/A  . Years of Education: N/A   Social History Main Topics  . Smoking status: Never Smoker   . Smokeless tobacco: Never Used  . Alcohol Use: 1.8 oz/week    3 Glasses of wine per week     Comment: Occassional white wine  . Drug  Use: No  . Sexual Activity: Not Asked   Other Topics Concern  . None   Social History Narrative   Divorced fall 2015, lives alone. adopted son nearby. Retired from [employer]  Constitutional: Negative for fever, chills and fatigue.  Respiratory: Negative for cough, shortness of breath and wheezing.   Cardiovascular: Positive for chest pain. Negative for palpitations and leg swelling.  Gastrointestinal: Negative for abdominal pain.       No GERD  Neurological: Negative for dizziness, light-headedness and headaches.  Psychiatric/Behavioral: Negative for dysphoric mood. The patient is not nervous/anxious.        Objective:   Filed Vitals:   05/04/15 0937  BP: 130/82  Pulse: 68  Temp: 97.8 F (36.6 C)  Resp: 16   Filed Weights   05/04/15 0937  Weight: 143 lb (64.864 kg)   Body mass index is 23.09 kg/(m^2).   Physical Exam  Constitutional: She is oriented to person, place, and time. She appears well-developed and well-nourished. No distress.  HENT:  Head: Normocephalic and atraumatic.  Neck: Neck supple. No JVD present. No tracheal deviation present. No thyromegaly present.  No carotid bruit  Cardiovascular: Normal rate, regular rhythm and normal heart sounds.   No murmur heard. Pulmonary/Chest: Effort normal and breath sounds normal. No respiratory distress. She has no wheezes.  Musculoskeletal: She exhibits no edema.  Lymphadenopathy:    She has no cervical adenopathy.  Neurological: She is alert and oriented to person, place, and time.  Skin: Skin is warm and dry. She is not diaphoretic.  Psychiatric: She has a normal mood and affect. Her behavior is normal. Judgment and thought content normal.        Assessment & Plan:   Flu vaccine given today  See Problem List.  Binnie Rail, MD

## 2015-05-04 NOTE — Assessment & Plan Note (Signed)
Stable, controlled Continue current dose of sertraline

## 2015-05-04 NOTE — Assessment & Plan Note (Signed)
Following with neurology-memory has been stable Continue Aricept daily Will restart Namenda once daily, which she has tolerated

## 2015-05-04 NOTE — Assessment & Plan Note (Addendum)
Episodes overnight while sleeping, no episodes recently with activity Possibly related to increased stress, but cardiac ischemia needs to be ruled out EKG today - reviewed - it is NSR at 60 bpm, left atrial enlargement,negative T waves V1-V2 Will need a stress test - will order an exercise stress test If she has any symptoms prior to the stress test I have advised her to go to ED

## 2015-05-18 ENCOUNTER — Ambulatory Visit: Payer: Medicare Other

## 2015-06-15 ENCOUNTER — Ambulatory Visit: Payer: Medicare Other

## 2015-06-23 ENCOUNTER — Telehealth: Payer: Self-pay

## 2015-06-23 NOTE — Telephone Encounter (Signed)
I spoke to the patient in regards to the CREAD study. However, she requested to be called back at another time.

## 2015-06-25 ENCOUNTER — Telehealth: Payer: Self-pay

## 2015-06-25 NOTE — Telephone Encounter (Signed)
I left a message for the patient to return my call.

## 2015-07-15 ENCOUNTER — Ambulatory Visit: Payer: Medicare Other

## 2015-07-16 ENCOUNTER — Ambulatory Visit (INDEPENDENT_AMBULATORY_CARE_PROVIDER_SITE_OTHER): Payer: Medicare Other

## 2015-07-16 DIAGNOSIS — E538 Deficiency of other specified B group vitamins: Secondary | ICD-10-CM | POA: Diagnosis not present

## 2015-07-16 MED ORDER — CYANOCOBALAMIN 1000 MCG/ML IJ SOLN
1000.0000 ug | Freq: Once | INTRAMUSCULAR | Status: AC
  Administered 2015-07-16: 1000 ug via INTRAMUSCULAR

## 2015-07-22 ENCOUNTER — Other Ambulatory Visit: Payer: Self-pay | Admitting: Internal Medicine

## 2015-08-14 ENCOUNTER — Ambulatory Visit: Payer: Medicare Other

## 2015-09-15 ENCOUNTER — Ambulatory Visit: Payer: Medicare Other

## 2015-09-21 ENCOUNTER — Telehealth: Payer: Self-pay | Admitting: *Deleted

## 2015-09-21 ENCOUNTER — Ambulatory Visit: Payer: Medicare Other | Admitting: Neurology

## 2015-09-21 NOTE — Telephone Encounter (Signed)
Called the morning of her appt to cancel - stated she was sick with a cold.

## 2015-09-22 ENCOUNTER — Encounter: Payer: Self-pay | Admitting: Neurology

## 2015-10-14 ENCOUNTER — Ambulatory Visit: Payer: Medicare Other

## 2015-11-05 ENCOUNTER — Ambulatory Visit: Payer: Medicare Other | Admitting: Neurology

## 2015-11-21 ENCOUNTER — Other Ambulatory Visit: Payer: Self-pay | Admitting: Internal Medicine

## 2015-12-03 ENCOUNTER — Ambulatory Visit (INDEPENDENT_AMBULATORY_CARE_PROVIDER_SITE_OTHER): Payer: Medicare Other | Admitting: Neurology

## 2015-12-03 ENCOUNTER — Encounter: Payer: Self-pay | Admitting: Neurology

## 2015-12-03 VITALS — BP 182/77 | HR 63 | Ht 66.0 in | Wt 145.5 lb

## 2015-12-03 DIAGNOSIS — E538 Deficiency of other specified B group vitamins: Secondary | ICD-10-CM | POA: Diagnosis not present

## 2015-12-03 DIAGNOSIS — F0390 Unspecified dementia without behavioral disturbance: Secondary | ICD-10-CM

## 2015-12-03 MED ORDER — MEMANTINE HCL 10 MG PO TABS: 10.0000 mg | ORAL_TABLET | Freq: Two times a day (BID) | ORAL | Status: DC

## 2015-12-03 MED ORDER — DONEPEZIL HCL 10 MG PO TABS: 10.0000 mg | ORAL_TABLET | Freq: Every day | ORAL | Status: DC

## 2015-12-03 NOTE — Progress Notes (Signed)
Chief Complaint  Patient presents with  . Memory Loss    MMSE 26/30 - 14 animals.  She has continued both Aricept and Namenda.  Feels she has good and bad days with her memory.      PATIENT: [name] Fox DOB: 01/25/1941  Chief Complaint  Patient presents with  . Memory Loss    MMSE 26/30 - 14 animals.  She has continued both Aricept and Namenda.  Feels she has good and bad days with her memory.     HISTORICAL  [name] Fox is a 75 years old right-handed Caucasian female, referred by rheumatologist Dr. Ouida Sills, and her primary care physician Dr. Asa Lente evaluation of short-term memory trouble.  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 She quit driving for month ago, because noticed worsening short-term memory trouble, slow reaction time with her most recent diagnosis of arthritis  Her older brothers at age 2, 64, suffered mild memory loss, uncle at her father side also had severe dementia, her father died at age 61 of pancreatic cancer, mother died at age 3 of infection  Recent laboratory showed LDL 112, previous cholesterol 290, normal TSH 1.15, mild elevated alkaline phosphatase at 156, low sodium 133, glucose 158  MRI of the brain  showed mild atrophy, no significant change compared to previous study in 2012, MRA of the brain was normal,  She complains of excessive snoring, fatigue, moderate sleepiness, ESS score is 9, FSS is 50, she also complains of early morning headaches, denied previous history of headaches, no visual change    She was found to have elevated ESR 70, B12 220, she was referred to rheumatologist, diagnosed with polymyalgia rheumatica, she was put on higher dose of prednisone, 20 mg every day Jan 2014, now tapering dose to 10 mg every day, which has helped her diffuse body aching pain, she continued to be frustrated by her short-term memory trouble, she was started on Zoloft, and Aricept, without a significant improvement, she has quit driving for 2 months, is not exercising regularly  UPDATE April 18th 2014: She had neuropsychiatric evaluation by Dr. Vikki Ports, most consistent with diagnosis of mild cognitive impairment, there is significant for concern of neurodegenerative condition such as Alzheimer's disease, particularly giving evidence of medial temporal, and the hippocampal dysfunction, Mini-Mental Status was 25 out of 30, markedly impaired unstructured verbal free recall, and recognition, Delayed contextual free recall, reduced nonverbal learning and memory, there was no significant signs of depression or  Anxiety.  UPDATE Feb 17th 2016: She went through divorce with her husband, she is no longer taking Namenda, she attributed a lot of her memory issue to her stress, she is still taking Aricept 10 mg every day, there was no significant worsening of her  memory trouble, her son drove her to visit today.  UPDATE Sept 6 2016: She went through divorce in 2015.  She lives at her home with her son, still driving, she is actively involved in community activity, she enjoys painting, exercise regularly She continue has mild memory trouble, today's Mini-Mental status examination is 26 out of 30, she is able to  tolerate Namenda 10 mg twice a day,   UPDATE May 18th 2017: She lives with her 66 years old son, she is taking Namenda 10 mg twice a day, Aricept 10 mg daily, tolerating the medication well, she is active in her yard, no longer driving  She had a history of vitamin B12 deficiency, used to take B12 shots, since she quit driving, mild inconvenient in traveling, she has stopped B12 supplement  REVIEW OF SYSTEMS: Full 14 system review of systems performed and notable only for hearing loss  ALLERGIES: Allergies  Allergen Reactions  . Penicillins Shortness Of Breath, Swelling and Rash  . Sulfasalazine Rash    HOME MEDICATIONS: Current Outpatient Prescriptions  Medication Sig Dispense Refill  . alendronate (FOSAMAX) 70 MG tablet Take 1 tablet (70 mg total) by mouth once a week. 4 tablet 5  . ALPRAZolam (XANAX) 0.5 MG tablet Take 0.5-1 tablets (0.25-0.5 mg total) by mouth every 6 (six) hours as needed for anxiety or sleep. 60 tablet 1  . amLODipine (NORVASC) 5 MG tablet Take 1 tablet (5 mg total) by mouth daily. 90 tablet 1  . dextromethorphan-guaiFENesin (MUCINEX DM) 30-600 MG per 12 hr tablet Take 1 tablet by mouth 2 (two) times daily as needed.    . donepezil (ARICEPT) 10 MG tablet Take 1 tablet (10 mg total) by mouth at bedtime. 30 tablet 11  . loratadine (CLARITIN) 10 MG tablet Take 10 mg by mouth daily.    Marland Kitchen losartan (COZAAR) 50 MG tablet Take 1 tablet (50 mg total) by mouth daily. 90 tablet 3  . memantine (NAMENDA) 10 MG tablet Take 1 tablet (10 mg total) by mouth 2 (two) times daily. 60 tablet 11  . Omega-3 Fatty Acids (FISH OIL) 1000 MG CAPS Take 1,000 mg by mouth daily.      . pravastatin (PRAVACHOL) 20 MG tablet Take 1 tablet (20 mg total) by mouth daily. Yearly physical w/labs due in July must see MD for refills 90 tablet 0  . sertraline (ZOLOFT) 100 MG tablet TAKE 1 TABLET BY MOUTH DAILY 90 tablet 3   No current facility-administered medications for this visit.    PAST MEDICAL  HISTORY: Past Medical History  Diagnosis Date  . HERPES ZOSTER   . HEARING LOSS, BILATERAL   . OSTEOPENIA   . VITAMIN B12 DEFICIENCY   . ALLERGIC RHINITIS   . ANXIETY   . DEGENERATIVE JOINT DISEASE, KNEES, BILATERAL   . DYSLIPIDEMIA   . PMR (polymyalgia rheumatica) (HCC) dx 10/2011    Initially dx GCA, then PMR clarified -on pred, follows with rheum  . Dementia 08/31/2010    neuropsyc eval 10/2012 and follows with neuro  . Fibrocystic breast disease   . Diverticulosis   . Hemorrhoids   . Adenomatous colon polyp 05/26/1999    procedure at [facility] in [place], [place]  . Osteoporosis     DEXA A LB 09/22/14: -2.4 R fem, progressive decline since 2011      PAST SURGICAL HISTORY: Past Surgical History  Procedure Laterality Date  . Bilateral mastectomy      while removing cosmetic silicone implants (NO Breast  cancer)  . Bilateral silicone breast explant  1993    Due to rupture/leak @ [facility]    FAMILY HISTORY: Family History  Problem Relation Age of Onset  . Pancreatic cancer Father   . Heart disease Brother   . Colon cancer Paternal Grandfather   . Colon cancer Paternal Aunt   . Inflammatory bowel disease Cousin     SOCIAL HISTORY:  Social History   Social History  . Marital Status: Married    Spouse Name: N/A  . Number of Children: N/A  . Years of Education: N/A   Occupational History  . Not on file.   Social History Main Topics  . Smoking status: Never Smoker   . Smokeless tobacco: Never Used  . Alcohol Use: 1.8 oz/week    3 Glasses of wine per week     Comment: Occassional white wine  . Drug Use: No  . Sexual Activity: Not on file   Other Topics Concern  . Not on file   Social History Narrative   Divorced fall 2015, lives alone. adopted son nearby. Retired from [employer]:   12/03/15 1445  BP: 182/77  Pulse: 63  Height: 5' 6"  (1.676 m)  Weight: 145 lb 8 oz (65.998 kg)    Not  recorded      Body mass index is 23.5 kg/(m^2).  PHYSICAL EXAMNIATION:  Gen: NAD, conversant, well nourised, obese, well groomed                     Cardiovascular: Regular rate rhythm, no peripheral edema, warm, nontender. Eyes: Conjunctivae clear without exudates or hemorrhage Neck: Supple, no carotid bruise. Pulmonary: Clear to auscultation bilaterally   NEUROLOGICAL EXAM:  MENTAL STATUS: Speech:    Speech is normal; fluent and spontaneous with normal comprehension.  Cognition:Mini-Mental Status Examination is 26 out of 30, animal naming 14      Orientation; not oriented to days     Recent and remote memory: Missed 3 out of 3 recalls     Normal Attention span and concentration     Normal Language, naming, repeating,spontaneous speech     Fund of knowledge   CRANIAL NERVES: CN II: Visual fields are full to confrontation. Fundoscopic exam is normal with sharp discs and no vascular changes. Pupils are round equal and briskly reactive to light. CN III, IV, VI: extraocular movement are normal. No ptosis. CN V: Facial sensation is intact to pinprick in all 3 divisions bilaterally. Corneal responses are intact.  CN VII: Face is symmetric with normal eye closure and smile. CN VIII: Hearing is normal to rubbing fingers CN IX, X: Palate elevates symmetrically. Phonation is normal. CN XI: Head turning and shoulder shrug are intact CN XII: Tongue is midline with normal movements and no atrophy.  MOTOR: There is no pronator drift of out-stretched arms. Muscle bulk and tone are normal. Muscle strength is normal.  REFLEXES: Reflexes are 2+ and symmetric at the biceps, triceps, knees, and ankles. Plantar responses are flexor.  SENSORY: Intact to light touch, pinprick, position sense, and vibration sense are intact in fingers and toes.  COORDINATION: Rapid alternating movements and fine finger movements are intact. There is no dysmetria on finger-to-nose and heel-knee-shin.     GAIT/STANCE: Posture is normal. Gait is steady with normal steps, base, arm swing, and turning. Heel and toe walking are normal. Tandem gait is normal.  Romberg is absent.   DIAGNOSTIC  DATA (LABS, IMAGING, TESTING) - I reviewed patient records, labs, notes, testing and imaging myself where available.   ASSESSMENT AND PLAN  [name] Fox is a 75 y.o. 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 Elray Buba, M.D. Ph.D.  [facility] [address], [place], [place] [address] Ph: [phone] Fax: [phone]  CC: Referring Provider

## 2016-02-02 ENCOUNTER — Encounter: Payer: Self-pay | Admitting: Internal Medicine

## 2016-02-02 ENCOUNTER — Ambulatory Visit (HOSPITAL_COMMUNITY)
Admission: RE | Admit: 2016-02-02 | Discharge: 2016-02-02 | Disposition: A | Discharge status: Home / Self Care | Payer: Medicare Other | Source: Ambulatory Visit | Attending: Internal Medicine | Admitting: Internal Medicine

## 2016-02-02 ENCOUNTER — Telehealth: Payer: Self-pay | Admitting: Internal Medicine

## 2016-02-02 ENCOUNTER — Other Ambulatory Visit (INDEPENDENT_AMBULATORY_CARE_PROVIDER_SITE_OTHER): Payer: Medicare Other

## 2016-02-02 ENCOUNTER — Ambulatory Visit (INDEPENDENT_AMBULATORY_CARE_PROVIDER_SITE_OTHER): Payer: Medicare Other | Admitting: Internal Medicine

## 2016-02-02 VITALS — BP 134/70 | HR 76 | Temp 97.5°F | Ht 67.0 in | Wt 143.5 lb

## 2016-02-02 DIAGNOSIS — F419 Anxiety disorder, unspecified: Secondary | ICD-10-CM | POA: Insufficient documentation

## 2016-02-02 DIAGNOSIS — Z0000 Encounter for general adult medical examination without abnormal findings: Secondary | ICD-10-CM

## 2016-02-02 DIAGNOSIS — I10 Essential (primary) hypertension: Secondary | ICD-10-CM

## 2016-02-02 DIAGNOSIS — F411 Generalized anxiety disorder: Secondary | ICD-10-CM

## 2016-02-02 DIAGNOSIS — R2241 Localized swelling, mass and lump, right lower limb: Secondary | ICD-10-CM | POA: Diagnosis not present

## 2016-02-02 DIAGNOSIS — M79669 Pain in unspecified lower leg: Secondary | ICD-10-CM | POA: Insufficient documentation

## 2016-02-02 DIAGNOSIS — E538 Deficiency of other specified B group vitamins: Secondary | ICD-10-CM | POA: Diagnosis not present

## 2016-02-02 DIAGNOSIS — M810 Age-related osteoporosis without current pathological fracture: Secondary | ICD-10-CM | POA: Diagnosis not present

## 2016-02-02 DIAGNOSIS — M79661 Pain in right lower leg: Secondary | ICD-10-CM

## 2016-02-02 DIAGNOSIS — F329 Major depressive disorder, single episode, unspecified: Secondary | ICD-10-CM

## 2016-02-02 DIAGNOSIS — F32A Depression, unspecified: Secondary | ICD-10-CM

## 2016-02-02 DIAGNOSIS — E785 Hyperlipidemia, unspecified: Secondary | ICD-10-CM | POA: Insufficient documentation

## 2016-02-02 LAB — COMPREHENSIVE METABOLIC PANEL
ALK PHOS: 72 U/L (ref 39–117)
ALT: 10 U/L (ref 0–35)
AST: 18 U/L (ref 0–37)
Albumin: 4.3 g/dL (ref 3.5–5.2)
BILIRUBIN TOTAL: 0.5 mg/dL (ref 0.2–1.2)
BUN: 14 mg/dL (ref 6–23)
CO2: 29 mEq/L (ref 19–32)
Calcium: 9.3 mg/dL (ref 8.4–10.5)
Chloride: 95 mEq/L — ABNORMAL LOW (ref 96–112)
Creatinine, Ser: 0.75 mg/dL (ref 0.40–1.20)
GFR: 80.43 mL/min (ref >60.00)
GLUCOSE: 97 mg/dL (ref 70–99)
Potassium: 4.4 mEq/L (ref 3.5–5.1)
SODIUM: 130 meq/L — AB (ref 135–145)
TOTAL PROTEIN: 7.4 g/dL (ref 6.0–8.3)

## 2016-02-02 LAB — LIPID PANEL
Cholesterol: 200 mg/dL (ref 0–200)
HDL: 85.5 mg/dL (ref >39.00)
LDL Cholesterol: 107 mg/dL — ABNORMAL HIGH (ref 0–99)
NonHDL: 114.99
Total CHOL/HDL Ratio: 2
Triglycerides: 39 mg/dL (ref 0.0–149.0)
VLDL: 7.8 mg/dL (ref 0.0–40.0)

## 2016-02-02 LAB — VITAMIN B12: Vitamin B-12: 196 pg/mL — ABNORMAL LOW (ref 211–911)

## 2016-02-02 LAB — TSH: TSH: 1.28 u[IU]/mL (ref 0.35–4.50)

## 2016-02-02 NOTE — Patient Instructions (Addendum)
Take calcium 600 mg twice daily and vitamin D 1000 units - 2000 units daily  We will call you to discuss start B12 injections and Prolia injections.   Test(s) ordered today. Your results will be released to [place] (or called to you) after review, usually within 72hours after test completion. If any changes need to be made, you will be notified at that same time.  All other Health Maintenance issues reviewed.   All recommended immunizations and age-appropriate screenings are up-to-date or discussed.  No immunizations administered today.   Medications reviewed and updated.  No changes recommended at this time.   A leg ultrasound was ordered to rule out a blood clot.   Please followup in 6 months

## 2016-02-02 NOTE — Progress Notes (Signed)
Subjective:    Patient ID: [name] Fox, female    DOB: March 21, 1941, 75 y.o.   MRN: [id number]  HPI She is here for a physical exam.   Right calf pain:  It has been there for one week.  She denies new activities or falls/injuries.  The pain is constant and seems to be moving up her leg.  She denies swelling and redness.  Tylenol has helped a little.  She is concerned about a blood clot.   OP:  Her dexa is up to date.  She is not taking calcium and vitamin D. She is not taking fosamax.  She walks for exercise.   B12 def:  She is not taking any oral B12 and has not had any B12 injections in months.    Medications and allergies reviewed with patient and updated if appropriate.  Patient Active Problem List   Diagnosis Date Noted  . Chest tightness 05/04/2015  . Memory loss 09/03/2014  . Numbness and tingling in right hand   . Hypertension 11/15/2013  . Weight loss, unintentional 04/12/2013  . Depression   . PMR (polymyalgia rheumatica) (HCC) 10/31/2011  . Vitamin B12 deficiency 08/31/2010  . Anxiety state 08/31/2010  . Dementia 08/31/2010  . Dyslipidemia 12/08/2009  . KELOID 11/19/2009  . HEARING LOSS, BILATERAL 08/20/2009  . 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 BRONCHIECTASIS WITHOUT ACUTE EXACERBATION 10/18/2007  . HERPES ZOSTER 09/10/2007  . ALLERGIC RHINITIS 04/06/2007  . FIBROCYSTIC BREAST DISEASE 04/06/2007  . Osteoporosis     Current Outpatient Prescriptions on File Prior to Visit  Medication Sig Dispense Refill  . alendronate (FOSAMAX) 70 MG tablet Take 1 tablet (70 mg total) by mouth once a week. 4 tablet 5  . ALPRAZolam (XANAX) 0.5 MG tablet Take 0.5-1 tablets (0.25-0.5 mg total) by mouth every 6 (six) hours as needed for anxiety or sleep. 60 tablet 1  . amLODipine (NORVASC) 5 MG tablet Take 1 tablet (5 mg total) by mouth daily. 90 tablet 1  . dextromethorphan-guaiFENesin (MUCINEX DM) 30-600 MG per 12 hr tablet Take 1 tablet by mouth 2 (two)  times daily as needed.    . donepezil (ARICEPT) 10 MG tablet Take 1 tablet (10 mg total) by mouth at bedtime. 90 tablet 4  . loratadine (CLARITIN) 10 MG tablet Take 10 mg by mouth daily.    Marland Kitchen losartan (COZAAR) 50 MG tablet Take 1 tablet (50 mg total) by mouth daily. 90 tablet 3  . memantine (NAMENDA) 10 MG tablet Take 1 tablet (10 mg total) by mouth 2 (two) times daily. 180 tablet 4  . Omega-3 Fatty Acids (FISH OIL) 1000 MG CAPS Take 1,000 mg by mouth daily.      . pravastatin (PRAVACHOL) 20 MG tablet Take 1 tablet (20 mg total) by mouth daily. Yearly physical w/labs due in July must see MD for refills 90 tablet 0  . sertraline (ZOLOFT) 100 MG tablet TAKE 1 TABLET BY MOUTH DAILY 90 tablet 3   No current facility-administered medications on file prior to visit.    Past Medical History  Diagnosis Date  . HERPES ZOSTER   . HEARING LOSS, BILATERAL   . OSTEOPENIA   . VITAMIN B12 DEFICIENCY   . ALLERGIC RHINITIS   . ANXIETY   . DEGENERATIVE JOINT DISEASE, KNEES, BILATERAL   . DYSLIPIDEMIA   . PMR (polymyalgia rheumatica) (HCC) dx 10/2011    Initially dx GCA, then PMR clarified -on pred, follows with rheum  . Dementia  08/31/2010    neuropsyc eval 10/2012 and follows with neuro  . Fibrocystic breast disease   . Diverticulosis   . Hemorrhoids   . Adenomatous colon polyp 05/26/1999    procedure at [facility] in [place], [place]  . Osteoporosis     DEXA A LB 09/22/14: -2.4 R fem, progressive decline since 2011      Past Surgical History  Procedure Laterality Date  . Bilateral mastectomy      while removing cosmetic silicone implants (NO Breast cancer)  . Bilateral silicone breast explant  1993    Due to rupture/leak @ [facility] History  . Marital Status: Married    Spouse Name: N/A  . Number of Children: N/A  . Years of Education: N/A   Social History Main Topics  . Smoking status: Never Smoker   . Smokeless tobacco: Never Used  . Alcohol  Use: 1.8 oz/week    3 Glasses of wine per week     Comment: Occassional white wine  . Drug Use: No  . Sexual Activity: Not on file   Other Topics Concern  . Not on file   Social History Narrative   Divorced fall 2015, lives alone. adopted son nearby. Retired from public health-nutritionist    Family History  Problem Relation Age of Onset  . Pancreatic cancer Father   . Heart disease Brother   . Colon cancer Paternal Grandfather   . Colon cancer Paternal Aunt   . Inflammatory bowel disease Cousin     Review of Systems  Constitutional: Negative for fever, chills, appetite change, fatigue and unexpected weight change.  HENT: Positive for hearing loss (hearing aids).   Eyes: Negative for visual disturbance.  Respiratory: Negative for cough, shortness of breath and wheezing.   Cardiovascular: Negative for chest pain, palpitations and leg swelling.  Gastrointestinal: Negative for nausea, abdominal pain, diarrhea, constipation and blood in stool.       No gerd  Genitourinary: Negative for dysuria and hematuria.  Musculoskeletal: Negative for back pain and arthralgias.  Skin: Negative for color change and rash.  Neurological: Negative for light-headedness and headaches.  Psychiatric/Behavioral: Positive for dysphoric mood (controlled). The patient is nervous/anxious (controlled).        Objective:   Filed Vitals:   02/02/16 1342  BP: 134/70  Pulse: 76  Temp: 97.5 F (36.4 C)   Filed Weights   02/02/16 1342  Weight: 143 lb 8 oz (65.091 kg)   Body mass index is 22.47 kg/(m^2).   Physical Exam Constitutional: She appears well-developed and well-nourished. No distress.  HENT:  Head: Normocephalic and atraumatic.  Right Ear: External ear normal. Normal ear canal and TM Left Ear: External ear normal.  Normal ear canal and TM Mouth/Throat: Oropharynx is clear and moist.  Eyes: Conjunctivae and EOM are normal.  Neck: Neck supple. No tracheal deviation present. No  thyromegaly present.  No carotid bruit  Cardiovascular: Normal rate, regular rhythm and normal heart sounds.   No murmur heard.  No edema. Pulmonary/Chest: Effort normal and breath sounds normal. No respiratory distress. She has no wheezes. She has no rales.  Breast: deferred  Abdominal: Soft. She exhibits no distension. There is no tenderness.  Msk: tenderness in right calf with palpation and flexion of foot Lymphadenopathy: She has no cervical adenopathy.  Skin: Skin is warm and dry. She is not diaphoretic.  Psychiatric: She has a normal mood and affect. Her behavior is normal.  Assessment & Plan:   Physical exam: Screening blood work ordered Immunizations - discussed shingles vaccine - deferred, others up to date Colonoscopy  Up to date  Mammogram - not a candidate Gyn - no longer sees a gyn Dexa   Up to date  Eye exams  Up to date  EKG  Last done 04/2015 Exercise - typically walks for exercise - will restart when left is better Weight - normal BMI Skin - no concerns Substance abuse - none  See Problem List for Assessment and Plan of chronic medical problems.  F/u in 6 months

## 2016-02-02 NOTE — Assessment & Plan Note (Signed)
Controlled, stable Continue current dose of sertraline - does not take xanax

## 2016-02-02 NOTE — Assessment & Plan Note (Signed)
[place] to rule out dvt

## 2016-02-02 NOTE — Telephone Encounter (Signed)
Let her know the [place] of her right leg showed no DVT.  She does have a cyst in the back of her knee, which is related to arthritis in the knee.

## 2016-02-02 NOTE — Progress Notes (Signed)
Pre visit review using our clinic review tool, if applicable. No additional management support is needed unless otherwise documented below in the visit note. 

## 2016-02-02 NOTE — Assessment & Plan Note (Signed)
Controlled, stable Continue current dose of medication  

## 2016-02-02 NOTE — Assessment & Plan Note (Addendum)
Not taking fosamax - has had some GI upset Not taking calcium and vitamin d - stressed cal/vit d daily - advised amount to take Consider prolia - will improve compliance and unlikely to cause GI side effects

## 2016-02-02 NOTE — Assessment & Plan Note (Signed)
H/o B12 def - will recheck Stressed concern with low B12 affecting her memory She is interested in B12 injections - not compliant with taking it orally Will check level today and if low start B12 injections

## 2016-02-02 NOTE — Assessment & Plan Note (Signed)
BP well controlled Current regimen effective and well tolerated Continue current medications at current doses  

## 2016-02-03 ENCOUNTER — Encounter: Payer: Medicare Other | Admitting: Internal Medicine

## 2016-02-03 LAB — CBC WITH DIFFERENTIAL/PLATELET
BASOS ABS: 0 10*3/uL (ref 0.0–0.1)
Basophils Relative: 0.4 % (ref 0.0–3.0)
Eosinophils Absolute: 0.1 10*3/uL (ref 0.0–0.7)
Eosinophils Relative: 2 % (ref 0.0–5.0)
HCT: 42 % (ref 36.0–46.0)
HEMOGLOBIN: 14 g/dL (ref 12.0–15.0)
LYMPHS ABS: 1.3 10*3/uL (ref 0.7–4.0)
Lymphocytes Relative: 19.8 % (ref 12.0–46.0)
MCHC: 33.4 g/dL (ref 30.0–36.0)
MCV: 92.7 fl (ref 78.0–100.0)
MONO ABS: 0.4 10*3/uL (ref 0.1–1.0)
Monocytes Relative: 6.2 % (ref 3.0–12.0)
NEUTROS PCT: 71.6 % (ref 43.0–77.0)
Neutro Abs: 4.9 10*3/uL (ref 1.4–7.7)
Platelets: 347 10*3/uL (ref 150.0–400.0)
RBC: 4.53 Mil/uL (ref 3.87–5.11)
RDW: 16.5 % — ABNORMAL HIGH (ref 11.5–15.5)
WBC: 6.8 10*3/uL (ref 4.0–10.5)

## 2016-02-03 NOTE — Telephone Encounter (Signed)
Spoke with pt to inform.  

## 2016-02-04 ENCOUNTER — Encounter: Payer: Self-pay | Admitting: Internal Medicine

## 2016-02-06 ENCOUNTER — Other Ambulatory Visit: Payer: Self-pay | Admitting: Internal Medicine

## 2016-03-04 ENCOUNTER — Telehealth: Payer: Self-pay

## 2016-03-04 NOTE — Telephone Encounter (Signed)
Patient's insurance co is requiring PA to be submitted for prolia injections---given to [name] Fox to start PA process

## 2016-03-08 ENCOUNTER — Other Ambulatory Visit: Payer: Self-pay | Admitting: Internal Medicine

## 2016-03-31 ENCOUNTER — Telehealth: Payer: Self-pay

## 2016-03-31 NOTE — Telephone Encounter (Signed)
Patient does not need PA for prolia injections, insurance has been verified with estimated copay $275 ---left message advising patient to call back to schedule nurse visit for prolia injection---this will be her first injection, so she can schedule anytime at her convenience----can talk with tamara if any questions

## 2016-03-31 NOTE — Telephone Encounter (Signed)
Patient's insurance did not require PA process for prolia injections---PA only needed if patient was undergoing cancer treatment----I have reverified insurance and have estimated $275 copay for prolia injections---patient will be called to schedule appt

## 2016-04-13 ENCOUNTER — Other Ambulatory Visit: Payer: Self-pay | Admitting: Internal Medicine

## 2016-05-23 ENCOUNTER — Ambulatory Visit: Payer: Medicare Other | Admitting: Family

## 2016-06-14 ENCOUNTER — Ambulatory Visit: Payer: Medicare Other

## 2016-07-27 ENCOUNTER — Other Ambulatory Visit: Payer: Self-pay | Admitting: Internal Medicine

## 2016-07-31 NOTE — Progress Notes (Deleted)
Subjective:    Patient ID: [name] Fox, female    DOB: 06-28-1941, 76 y.o.   MRN: [id number]  HPI The patient is here for follow up.  Hypertension: She is taking her medication daily. She is compliant with a low sodium diet.  She denies chest pain, palpitations, edema, shortness of breath and regular headaches. She is exercising regularly.  She does not monitor her blood pressure at home.    Hyperlipidemia: She is taking her medication daily. She is compliant with a low fat/cholesterol diet. She is exercising regularly. She denies myalgias.   Depression: She is taking her medication daily as prescribed. She denies any side effects from the medication. She feels her depression is well controlled and she is happy with her current dose of medication.   Dementia:  She is following with neurology.  Osteoporosis:  Her last dexa was 09/2014.  She was on fosamax but for an unknown number of years.  She has been off of it for a year +/-.  She is not taking any calcium or vitamin d.    Medications and allergies reviewed with patient and updated if appropriate.  Patient Active Problem List   Diagnosis Date Noted  . Calf pain 02/02/2016  . Chest tightness 05/04/2015  . Memory loss 09/03/2014  . Numbness and tingling in right hand   . Hypertension 11/15/2013  . Weight loss, unintentional 04/12/2013  . Depression   . PMR (polymyalgia rheumatica) (HCC) 10/31/2011  . Vitamin B12 deficiency 08/31/2010  . Anxiety state 08/31/2010  . Dementia 08/31/2010  . Dyslipidemia 12/08/2009  . KELOID 11/19/2009  . HEARING LOSS, BILATERAL 08/20/2009  . 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 BRONCHIECTASIS WITHOUT ACUTE EXACERBATION 10/18/2007  . HERPES ZOSTER 09/10/2007  . ALLERGIC RHINITIS 04/06/2007  . FIBROCYSTIC BREAST DISEASE 04/06/2007  . Osteoporosis     Current Outpatient Prescriptions on File Prior to Visit  Medication Sig Dispense Refill  . alendronate (FOSAMAX) 70 MG  tablet Take 1 tablet (70 mg total) by mouth once a week. 4 tablet 5  . amLODipine (NORVASC) 5 MG tablet TAKE 1 TABLET(5 MG) BY MOUTH DAILY 90 tablet 3  . dextromethorphan-guaiFENesin (MUCINEX DM) 30-600 MG per 12 hr tablet Take 1 tablet by mouth 2 (two) times daily as needed.    . donepezil (ARICEPT) 10 MG tablet Take 1 tablet (10 mg total) by mouth at bedtime. 90 tablet 4  . loratadine (CLARITIN) 10 MG tablet Take 10 mg by mouth daily.    Marland Kitchen losartan (COZAAR) 50 MG tablet TAKE 1 TABLET(50 MG) BY MOUTH DAILY 90 tablet 1  . memantine (NAMENDA) 10 MG tablet Take 1 tablet (10 mg total) by mouth 2 (two) times daily. 180 tablet 4  . Omega-3 Fatty Acids (FISH OIL) 1000 MG CAPS Take 1,000 mg by mouth daily.      . pravastatin (PRAVACHOL) 20 MG tablet Take 1 tablet (20 mg total) by mouth daily. 90 tablet 3  . sertraline (ZOLOFT) 100 MG tablet TAKE 1 TABLET BY MOUTH DAILY 90 tablet 0   No current facility-administered medications on file prior to visit.     Past Medical History:  Diagnosis Date  . Adenomatous colon polyp 05/26/1999   procedure at [facility] in [place], [place]   . ANXIETY   . DEGENERATIVE JOINT DISEASE, KNEES, BILATERAL   . Dementia 08/31/2010   neuropsyc eval 10/2012 and follows with neuro  . Diverticulosis   . DYSLIPIDEMIA   .  Fibrocystic breast disease   . HEARING LOSS, BILATERAL   . Hemorrhoids   . HERPES ZOSTER   . OSTEOPENIA   . Osteoporosis    DEXA A LB 09/22/14: -2.4 R fem, progressive decline since 2011    . PMR (polymyalgia rheumatica) (HCC) dx 10/2011   Initially dx GCA, then PMR clarified -on pred, follows with rheum  . VITAMIN B12 DEFICIENCY     Past Surgical History:  Procedure Laterality Date  . Bilateral Mastectomy     while removing cosmetic silicone implants (NO Breast cancer)  . Bilateral silicone Breast explant  1993   Due to rupture/leak @ [place] History   Social History  . Marital status: Married     Spouse name: N/A  . Number of children: N/A  . Years of education: N/A   Social History Main Topics  . Smoking status: Never Smoker  . Smokeless tobacco: Never Used  . Alcohol use 1.8 oz/week    3 Glasses of wine per week     Comment: Occassional white wine  . Drug use: No  . Sexual activity: Not on file   Other Topics Concern  . Not on file   Social History Narrative   Divorced fall 2015, lives alone. adopted son nearby. Retired from public health-nutritionist    Family History  Problem Relation Age of Onset  . Pancreatic cancer Father   . Heart disease Brother   . Colon cancer Paternal Grandfather   . Colon cancer Paternal Aunt   . Inflammatory bowel disease Cousin     Review of Systems     Objective:  There were no vitals filed for this visit. Wt Readings from Last 3 Encounters:  02/02/16 143 lb 8 oz (65.1 kg)  12/03/15 145 lb 8 oz (66 kg)  05/04/15 143 lb (64.9 kg)   There is no height or weight on file to calculate BMI.   Physical Exam    Constitutional: Appears well-developed and well-nourished. No distress.  HENT:  Head: Normocephalic and atraumatic.  Neck: Neck supple. No tracheal deviation present. No thyromegaly present.  No cervical lymphadenopathy Cardiovascular: Normal rate, regular rhythm and normal heart sounds.   No murmur heard. No carotid bruit .  No edema Pulmonary/Chest: Effort normal and breath sounds normal. No respiratory distress. No has no wheezes. No rales.  Skin: Skin is warm and dry. Not diaphoretic.  Psychiatric: Normal mood and affect. Behavior is normal.      Assessment & Plan:    See Problem List for Assessment and Plan of chronic medical problems.

## 2016-08-01 ENCOUNTER — Ambulatory Visit: Payer: Medicare Other | Admitting: Internal Medicine

## 2016-08-09 ENCOUNTER — Ambulatory Visit: Payer: Medicare Other

## 2016-08-10 ENCOUNTER — Ambulatory Visit (INDEPENDENT_AMBULATORY_CARE_PROVIDER_SITE_OTHER): Payer: Medicare Other | Admitting: Internal Medicine

## 2016-08-10 VITALS — BP 202/110 | HR 71 | Temp 97.7°F | Resp 16 | Ht 67.0 in | Wt 141.0 lb

## 2016-08-10 DIAGNOSIS — I10 Essential (primary) hypertension: Secondary | ICD-10-CM | POA: Diagnosis not present

## 2016-08-10 DIAGNOSIS — F411 Generalized anxiety disorder: Secondary | ICD-10-CM | POA: Diagnosis not present

## 2016-08-10 DIAGNOSIS — Z23 Encounter for immunization: Secondary | ICD-10-CM

## 2016-08-10 DIAGNOSIS — M810 Age-related osteoporosis without current pathological fracture: Secondary | ICD-10-CM | POA: Diagnosis not present

## 2016-08-10 DIAGNOSIS — E538 Deficiency of other specified B group vitamins: Secondary | ICD-10-CM | POA: Diagnosis not present

## 2016-08-10 NOTE — Progress Notes (Signed)
Pre visit review using our clinic review tool, if applicable. No additional management support is needed unless otherwise documented below in the visit note. 

## 2016-08-10 NOTE — Patient Instructions (Addendum)
Start taking calcium and vitamin d daily.  Start taking vitamin  B 12 1000 mcg daily  Take an extra amlodipine today when you get home.  Start monitoring your BP at home.  Call if it stays over 140/90.    Test(s) ordered today. Your results will be released to [facility] (or called to you) after review, usually within 72hours after test completion. If any changes need to be made, you will be notified at that same time.  All other Health Maintenance issues reviewed.   All recommended immunizations and age-appropriate screenings are up-to-date or discussed.  Flu immunization administered today.   Medications reviewed and updated.  No changes recommended at this time.   A dexa ( bone density ) was ordered.    Please followup in 2 weeks
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
## 2016-08-11 ENCOUNTER — Encounter: Payer: Self-pay | Admitting: Internal Medicine
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
## 2016-08-11 NOTE — Assessment & Plan Note (Signed)
Not taking B12  Stressed taking the B12 daily - she stays she will start Advised B12 1000 mcg daily

## 2016-08-11 NOTE — Assessment & Plan Note (Signed)
dexa ordered Stressed taking calcium and vitamin d daily She is not exercising regularly - stressed regular exercicse

## 2016-08-11 NOTE — Assessment & Plan Note (Signed)
BP very hight today  - ? Related to anxiety or uncontrolled BP  encouraged her to start checking BP regularly Continue current medication for now Fu in 2 weeks

## 2016-08-30 NOTE — Progress Notes (Unsigned)
Subjective:   [name] Fox is a 76 y.o. female who presents for Medicare Annual (Subsequent) preventive examination.  Review of Systems:  No ROS.  Medicare Wellness Visit.    Sleep patterns: {SX; SLEEP PATTERNS:18802::"feels rested on waking","does not get up to void","gets up *** times nightly to void","sleeps *** hours nightly"}.   Home Safety/Smoke Alarms:   Living environment; residence and Firearm Safety: {Rehab home environment / accessibility:30080::"no firearms","firearms stored safely"}. Seat Belt Safety/Bike Helmet: Wears seat belt.   Counseling:   Eye Exam-  Dental-  Female:   Pap- N/A      Mammo-  History of bilateral masectomies    Dexa scan- Last 09/22/14, osteoporosis      CCS- Last 02/20/13, adenomatous polyp, recall 5 years      Objective:     Vitals: There were no vitals taken for this visit.  There is no height or weight on file to calculate BMI.   Tobacco History  Smoking Status  . Never Smoker  Smokeless Tobacco  . Never Used     Counseling given: Not Answered   Past Medical History:  Diagnosis Date  . Adenomatous colon polyp 05/26/1999   procedure at [facility] in [place], [place]   . ANXIETY   . DEGENERATIVE JOINT DISEASE, KNEES, BILATERAL   . Dementia 08/31/2010   neuropsyc eval 10/2012 and follows with neuro  . Diverticulosis   . DYSLIPIDEMIA   . Fibrocystic breast disease   . HEARING LOSS, BILATERAL   . Hemorrhoids   . HERPES ZOSTER   . OSTEOPENIA   . Osteoporosis    DEXA A LB 09/22/14: -2.4 R fem, progressive decline since 2011    . PMR (polymyalgia rheumatica) (HCC) dx 10/2011   Initially dx GCA, then PMR clarified -on pred, follows with rheum  . VITAMIN B12 DEFICIENCY    Past Surgical History:  Procedure Laterality Date  . Bilateral Mastectomy     while removing cosmetic silicone implants (NO Breast cancer)  . Bilateral silicone Breast explant  1993   Due to rupture/leak @ [place]   Family  History  Problem Relation Age of Onset  . Pancreatic cancer Father   . Heart disease Brother   . Colon cancer Paternal Grandfather   . Colon cancer Paternal Aunt   . Inflammatory bowel disease Cousin    History  Sexual Activity  . Sexual activity: Not on file    Outpatient Encounter Prescriptions as of 08/31/2016  Medication Sig  . amLODipine (NORVASC) 5 MG tablet TAKE 1 TABLET(5 MG) BY MOUTH DAILY  . donepezil (ARICEPT) 10 MG tablet Take 1 tablet (10 mg total) by mouth at bedtime.  Marland Kitchen loratadine (CLARITIN) 10 MG tablet Take 10 mg by mouth daily.  Marland Kitchen losartan (COZAAR) 50 MG tablet TAKE 1 TABLET(50 MG) BY MOUTH DAILY  . memantine (NAMENDA) 10 MG tablet Take 1 tablet (10 mg total) by mouth 2 (two) times daily.  . Omega-3 Fatty Acids (FISH OIL) 1000 MG CAPS Take 1,000 mg by mouth daily.    . pravastatin (PRAVACHOL) 20 MG tablet Take 1 tablet (20 mg total) by mouth daily.  . sertraline (ZOLOFT) 100 MG tablet TAKE 1 TABLET BY MOUTH DAILY  . triamcinolone ointment (KENALOG) 0.1 % USE 1 APPLICATION OF OINTMENT AA BID. APPLY BID TO TID.   No facility-administered encounter medications on file as of 08/31/2016.     Activities of Daily Living No flowsheet data found.  Patient Care Team:  Binnie Rail, MD as PCP - General (Internal Medicine) Collene Gobble, MD as Consulting Physician (Pulmonary Disease) Hurley Cisco, MD (Rheumatology) Marcial Pacas, MD (Neurology) Lafayette Dragon, MD (Inactive) (Gastroenterology)    Assessment:     Physical assessment deferred to PCP.  Exercise Activities and Dietary recommendations   Diet (meal preparation, eat out, water intake, caffeinated beverages, dairy products, fruits and vegetables): {Desc; diets:16563} Breakfast: Lunch:  Dinner:      Goals    None     Fall Risk Fall Risk  02/02/2016 02/02/2015 09/22/2014 02/05/2013  Falls in the past year? No No No No  Risk for fall due to : - - Mental status change -   Depression Screen PHQ 2/9 Scores  02/02/2016 02/02/2015 02/05/2013  PHQ - 2 Score 1 0 1     Cognitive Function MMSE - Mini Mental State Exam 12/03/2015 03/24/2015 09/03/2014  Orientation to time 4 2 2   Orientation to Place 5 5 5   Registration 3 3 3   Attention/ Calculation 5 5 5   Recall 0 2 2  Language- name 2 objects 2 2 2   Language- repeat 1 1 1   Language- follow 3 step command 3 3 3   Language- read & follow direction 1 1 1   Write a sentence 1 1 1   Copy design 1 1 1   Total score 26 26 26         Immunization History  Administered Date(s) Administered  . Influenza Split 05/13/2011, 04/10/2012  . Influenza Whole 04/17/2009  . Influenza, High Dose Seasonal PF 08/10/2016  . Influenza,inj,Quad PF,36+ Mos 04/09/2013, 06/17/2014, 05/04/2015  . Pneumococcal Conjugate-13 06/17/2014  . Pneumococcal Polysaccharide-23 08/20/2009  . Td 07/18/2004, 09/22/2014   Screening Tests Health Maintenance  Topic Date Due  . ZOSTAVAX  09/30/2002  . COLONOSCOPY  02/20/2018  . TETANUS/TDAP  09/21/2024  . INFLUENZA VACCINE  Completed  . DEXA SCAN  Completed  . PNA vac Low Risk Adult  Completed      Plan:    During the course of the visit the patient was educated and counseled about the following appropriate screening and preventive services:   Vaccines to include Pneumoccal, Influenza, Hepatitis B, Td, Zostavax, HCV  Cardiovascular Disease  Colorectal cancer screening  Bone density screening  Diabetes screening  Glaucoma screening  Mammography/PAP  Nutrition counseling   Patient Instructions (the written plan) was given to the patient.   Michiel Cowboy, RN  08/30/2016

## 2016-08-30 NOTE — Progress Notes (Unsigned)
Pre visit review using our clinic review tool, if applicable. No additional management support is needed unless otherwise documented below in the visit note. 

## 2016-08-31 ENCOUNTER — Ambulatory Visit: Payer: Medicare Other

## 2016-08-31 ENCOUNTER — Ambulatory Visit (INDEPENDENT_AMBULATORY_CARE_PROVIDER_SITE_OTHER): Payer: Medicare Other | Admitting: Internal Medicine

## 2016-08-31 ENCOUNTER — Encounter: Payer: Self-pay | Admitting: Internal Medicine

## 2016-08-31 VITALS — BP 174/86 | HR 59 | Temp 97.5°F | Resp 16 | Wt 141.0 lb

## 2016-08-31 DIAGNOSIS — I10 Essential (primary) hypertension: Secondary | ICD-10-CM

## 2016-08-31 DIAGNOSIS — Z0000 Encounter for general adult medical examination without abnormal findings: Secondary | ICD-10-CM

## 2016-08-31 MED ORDER — LOSARTAN POTASSIUM 100 MG PO TABS: 100.0000 mg | ORAL_TABLET | Freq: Every day | ORAL | 3 refills | Status: DC

## 2016-08-31 NOTE — Progress Notes (Signed)
Pre visit review using our clinic review tool, if applicable. No additional management support is needed unless otherwise documented below in the visit note. 

## 2016-08-31 NOTE — Patient Instructions (Addendum)
Continue to eat heart healthy diet (full of fruits, vegetables, whole grains, lean protein, water--limit salt, fat, and sugar intake) and increase physical activity as tolerated.   Continue doing brain stimulating activities (puzzles, reading, adult coloring books, staying active) to keep memory sharp.   Per Dr.Burns start to take 1000 mcg vitamin B-12  Check into Advance Directive to update  [name] Fox , Thank you for taking time to come for your Medicare Wellness Visit. I appreciate your ongoing commitment to your health goals. Please review the following plan we discussed and let me know if I can assist you in the future.   These are the goals we discussed: Goals    . decrease stress          Using  Stress reduction tips as mindfulness and deep breathing.       This is a list of the screening recommended for you and due dates:  Health Maintenance  Topic Date Due  . Shingles Vaccine  09/30/2002  . Colon Cancer Screening  02/20/2018  . Tetanus Vaccine  09/21/2024  . Flu Shot  Completed  . DEXA scan (bone density measurement)  Completed  . Pneumonia vaccines  Completed    Increase losartan to 100 mg daily.  Monitor your BP - goal is less than 140/90 -- call if elevated.   FU in 4 months

## 2016-08-31 NOTE — Assessment & Plan Note (Signed)
BP still elevated, but slightly better at home Increase losartan to 100 mg daily Continue amlodipine 5 mg daily Continue to monitor at home - call if over 140/90 Continue regular exercise, low sodium diet Follow up in 4 months - sooner if needed

## 2016-08-31 NOTE — Progress Notes (Addendum)
Subjective:   [name] Fox is a 76 y.o. female who presents for Medicare Annual (Subsequent) preventive examination.  Review of Systems:  No ROS.  Medicare Wellness Visit.  Cardiac Risk Factors include: hypertension;advanced age (>31men, >22 women);dyslipidemia;family history of premature cardiovascular disease Sleep patterns: no sleep issues, feels rested on waking, gets up 1 times nightly to void and sleeps 8-9 hours nightly.   Home Safety/Smoke Alarms:  Feels safe in home. Smoke alarms in place.   Living environment; residence and Firearm Safety: 2-story house, no firearms. Lives with son Seat Belt Safety/Bike Helmet: Wears seat belt.   Counseling:   Eye Exam- goes yearly Dental-  Goes yearly  Female:   Pap-    N/A   Mammo-   N/A bilateral masectomies    Dexa scan-   Last 09/22/14, osteoporosis    CCS- Last 02/20/13, adenomatous polyp, 5 year recall      Objective:     Vitals: BP (!) 174/86   Pulse (!) 59   Temp 97.5 F (36.4 C) (Oral)   Resp 16   Wt 141 lb (64 kg)   SpO2 96%   BMI 22.08 kg/m   Body mass index is 22.08 kg/m.   Tobacco History  Smoking Status  . Never Smoker  Smokeless Tobacco  . Never Used     Counseling given: Not Answered   Past Medical History:  Diagnosis Date  . Adenomatous colon polyp 05/26/1999   procedure at [facility] in [place], [place]   . ANXIETY   . DEGENERATIVE JOINT DISEASE, KNEES, BILATERAL   . Dementia 08/31/2010   neuropsyc eval 10/2012 and follows with neuro  . Diverticulosis   . DYSLIPIDEMIA   . Fibrocystic breast disease   . HEARING LOSS, BILATERAL   . Hemorrhoids   . HERPES ZOSTER   . OSTEOPENIA   . Osteoporosis    DEXA A LB 09/22/14: -2.4 R fem, progressive decline since 2011    . PMR (polymyalgia rheumatica) (HCC) dx 10/2011   Initially dx [facility], then PMR clarified -on pred, follows with rheum  . VITAMIN B12 DEFICIENCY    Past Surgical History:  Procedure Laterality Date  .  Bilateral Mastectomy     while removing cosmetic silicone implants (NO Breast cancer)  . Bilateral silicone Breast explant  1993   Due to rupture/leak @ [place]   Family History  Problem Relation Age of Onset  . Pancreatic cancer Father   . Heart disease Brother   . Colon cancer Paternal Grandfather   . Colon cancer Paternal Aunt   . Inflammatory bowel disease Cousin    History  Sexual Activity  . Sexual activity: Not Currently    Outpatient Encounter Prescriptions as of 08/31/2016  Medication Sig  . amLODipine (NORVASC) 5 MG tablet TAKE 1 TABLET(5 MG) BY MOUTH DAILY  . Calcium-Magnesium-Vitamin D (CALCIUM 500 PO) Take 2 tablets by mouth daily.  . Cholecalciferol (VITAMIN D3) 10000 units TABS Take 1,000 Units by mouth daily.   Marland Kitchen donepezil (ARICEPT) 10 MG tablet Take 1 tablet (10 mg total) by mouth at bedtime.  Marland Kitchen loratadine (CLARITIN) 10 MG tablet Take 10 mg by mouth daily.  Marland Kitchen losartan (COZAAR) 100 MG tablet Take 1 tablet (100 mg total) by mouth daily.  . memantine (NAMENDA) 10 MG tablet Take 1 tablet (10 mg total) by mouth 2 (two) times daily.  . Omega-3 Fatty Acids (FISH OIL) 1000 MG CAPS Take 1,000 mg by mouth daily.    Marland Kitchen  pravastatin (PRAVACHOL) 20 MG tablet Take 1 tablet (20 mg total) by mouth daily.  . sertraline (ZOLOFT) 100 MG tablet TAKE 1 TABLET BY MOUTH DAILY  . triamcinolone ointment (KENALOG) 0.1 % USE 1 APPLICATION OF OINTMENT AA BID. APPLY BID TO TID.  . [DISCONTINUED] losartan (COZAAR) 50 MG tablet TAKE 1 TABLET(50 MG) BY MOUTH DAILY   No facility-administered encounter medications on file as of 08/31/2016.     Activities of Daily Living In your present state of health, do you have any difficulty performing the following activities: 08/31/2016  Hearing? N  Vision? N  Difficulty concentrating or making decisions? N  Walking or climbing stairs? N  Dressing or bathing? N  Doing errands, shopping? N  Preparing Food and eating ? N  Using the Toilet? N  In the  past six months, have you accidently leaked urine? Y  Do you have problems with loss of bowel control? N  Managing your Medications? N  Managing your Finances? N  Housekeeping or managing your Housekeeping? N  Some recent data might be hidden    Patient Care Team: Binnie Rail, MD as PCP - General (Internal Medicine) Collene Gobble, MD as Consulting Physician (Pulmonary Disease) Hurley Cisco, MD (Rheumatology) Marcial Pacas, MD (Neurology)    Assessment:     Physical assessment deferred to PCP.  Exercise Activities and Dietary recommendations Current Exercise Habits: The patient does not participate in regular exercise at present (plans to join [employer] ), Type of exercise: walking (states she can start home exercises and walking in neighborhood), Exercise limited by: None identified  Diet (meal preparation, eat out, water intake, caffeinated beverages, dairy products, fruits and vegetables): in general, a "healthy" diet  , well balanced Limits caffeine and soda intake.  Discussed reading sodium labels and encouraged strict low sodium diet while blood pressure remains high.    Goals    . decrease stress          Using  Stress reduction tips as mindfulness and deep breathing.      Fall Risk Fall Risk  08/31/2016 02/02/2016 02/02/2015 09/22/2014 02/05/2013  Falls in the past year? No No No No No  Risk for fall due to : - - - Mental status change -   Depression Screen PHQ 2/9 Scores 08/31/2016 02/02/2016 02/02/2015 02/05/2013  PHQ - 2 Score 1 1 0 1     Cognitive Function MMSE - Mini Mental State Exam 12/03/2015 03/24/2015 09/03/2014  Orientation to time 4 2 2   Orientation to Place 5 5 5   Registration 3 3 3   Attention/ Calculation 5 5 5   Recall 0 2 2  Language- name 2 objects 2 2 2   Language- repeat 1 1 1   Language- follow 3 step command 3 3 3   Language- read & follow direction 1 1 1   Write a sentence 1 1 1   Copy design 1 1 1   Total score 26 26 26    Patient states she  feels her memory has improved as her stress has decreased. She explains she is planning to make an appointment with her neurologist to re-evaluate her cognitive status. She declines performing MMS Exam and explains that her neurologist will perform her evaluation.       Immunization History  Administered Date(s) Administered  . Influenza Split 05/13/2011, 04/10/2012  . Influenza Whole 04/17/2009  . Influenza, High Dose Seasonal PF 08/10/2016  . Influenza,inj,Quad PF,36+ Mos 04/09/2013, 06/17/2014, 05/04/2015  . Pneumococcal Conjugate-13 06/17/2014  . Pneumococcal Polysaccharide-23 08/20/2009  .  Td 07/18/2004, 09/22/2014   Screening Tests Health Maintenance  Topic Date Due  . ZOSTAVAX  09/30/2002  . COLONOSCOPY  02/20/2018  . TETANUS/TDAP  09/21/2024  . INFLUENZA VACCINE  Completed  . DEXA SCAN  Completed  . PNA vac Low Risk Adult  Completed      Plan:     Continue to eat heart healthy diet (full of fruits, vegetables, whole grains, lean protein, water--limit salt, fat, and sugar intake) and increase physical activity as tolerated.  Continue doing brain stimulating activities (puzzles, reading, adult coloring books, staying active) to keep memory sharp.   Per Dr.Burns start to take 1000 mcg vitamin B-12 and continue to take calcium and vitamin D supplements.  Reminder to update Advance Directive   During the course of the visit the patient was educated and counseled about the following appropriate screening and preventive services:   Vaccines to include Pneumoccal, Influenza, Hepatitis B, Td, Zostavax, HCV  Cardiovascular Disease  Colorectal cancer screening  Bone density screening  Diabetes screening  Glaucoma screening  Mammography/PAP  Nutrition counseling   Patient Instructions (the written plan) was given to the patient.   Michiel Cowboy, RN  08/31/2016    Medical screening examination/treatment/procedure(s) were performed by non-physician practitioner and  as supervising physician I was immediately available for consultation/collaboration. I agree with above. Binnie Rail, MD

## 2016-08-31 NOTE — Progress Notes (Signed)
Subjective:    Patient ID: [name] Fox, female    DOB: [birth year], 76 y.o.   MRN: [id number]  HPI The patient is here for follow up.  Hypertension: She is taking her medication daily. She is compliant with a low sodium diet.  She denies chest pain, palpitations, edema, shortness of breath and regular headaches. She is exercising regularly.  She does monitor her blood pressure at home - it is down, but not normal.    Her stress level has decreased and she is feeling better about her divorce.     Medications and allergies reviewed with patient and updated if appropriate.  Patient Active Problem List   Diagnosis Date Noted  . Calf pain 02/02/2016  . Chest tightness 05/04/2015  . Numbness and tingling in right hand   . Hypertension 11/15/2013  . Weight loss, unintentional 04/12/2013  . Depression   . PMR (polymyalgia rheumatica) (HCC) 10/31/2011  . Vitamin B12 deficiency 08/31/2010  . Anxiety state 08/31/2010  . Dementia 08/31/2010  . Dyslipidemia 12/08/2009  . KELOID 11/19/2009  . HEARING LOSS, BILATERAL 08/20/2009  . 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 BRONCHIECTASIS WITHOUT ACUTE EXACERBATION 10/18/2007  . HERPES ZOSTER 09/10/2007  . ALLERGIC RHINITIS 04/06/2007  . FIBROCYSTIC BREAST DISEASE 04/06/2007  . Osteoporosis     Current Outpatient Prescriptions on File Prior to Visit  Medication Sig Dispense Refill  . amLODipine (NORVASC) 5 MG tablet TAKE 1 TABLET(5 MG) BY MOUTH DAILY 90 tablet 3  . donepezil (ARICEPT) 10 MG tablet Take 1 tablet (10 mg total) by mouth at bedtime. 90 tablet 4  . loratadine (CLARITIN) 10 MG tablet Take 10 mg by mouth daily.    Marland Kitchen losartan (COZAAR) 50 MG tablet TAKE 1 TABLET(50 MG) BY MOUTH DAILY 90 tablet 1  . memantine (NAMENDA) 10 MG tablet Take 1 tablet (10 mg total) by mouth 2 (two) times daily. 180 tablet 4  . Omega-3 Fatty Acids (FISH OIL) 1000 MG CAPS Take 1,000 mg by mouth daily.      . pravastatin (PRAVACHOL) 20  MG tablet Take 1 tablet (20 mg total) by mouth daily. 90 tablet 3  . sertraline (ZOLOFT) 100 MG tablet TAKE 1 TABLET BY MOUTH DAILY 90 tablet 0  . triamcinolone ointment (KENALOG) 0.1 % USE 1 APPLICATION OF OINTMENT AA BID. APPLY BID TO TID.  1   No current facility-administered medications on file prior to visit.     Past Medical History:  Diagnosis Date  . Adenomatous colon polyp 05/26/1999   procedure at [facility] in [place], [place]   . ANXIETY   . DEGENERATIVE JOINT DISEASE, KNEES, BILATERAL   . Dementia 08/31/2010   neuropsyc eval 10/2012 and follows with neuro  . Diverticulosis   . DYSLIPIDEMIA   . Fibrocystic breast disease   . HEARING LOSS, BILATERAL   . Hemorrhoids   . HERPES ZOSTER   . OSTEOPENIA   . Osteoporosis    DEXA A LB 09/22/14: -2.4 R fem, progressive decline since 2011    . PMR (polymyalgia rheumatica) (HCC) dx 10/2011   Initially dx [facility], then PMR clarified -on pred, follows with rheum  . VITAMIN B12 DEFICIENCY     Past Surgical History:  Procedure Laterality Date  . Bilateral Mastectomy     while removing cosmetic silicone implants (NO Breast cancer)  . Bilateral silicone Breast explant  1993   Due to rupture/leak @ [place]  History   Social History  . Marital status: Married    Spouse name: N/A  . Number of children: N/A  . Years of education: N/A   Social History Main Topics  . Smoking status: Never Smoker  . Smokeless tobacco: Never Used  . Alcohol use 1.8 oz/week    3 Glasses of wine per week     Comment: Occassional white wine  . Drug use: No  . Sexual activity: Not Asked   Other Topics Concern  . None   Social History Narrative   Divorced fall 2015, lives alone. adopted son nearby. Retired from public health-nutritionist    Family History  Problem Relation Age of Onset  . Pancreatic cancer Father   . Heart disease Brother   . Colon cancer Paternal Grandfather   . Colon cancer Paternal  Aunt   . Inflammatory bowel disease Cousin     Review of Systems  Constitutional: Negative for appetite change, fever and unexpected weight change.  Respiratory: Negative for cough, shortness of breath and wheezing.   Cardiovascular: Negative for chest pain, palpitations and leg swelling.  Neurological: Negative for light-headedness and headaches.  Psychiatric/Behavioral: Negative for sleep disturbance.       Objective:   Vitals:   08/31/16 1433  BP: (!) 174/86  Pulse: (!) 59  Resp: 16  Temp: 97.5 F (36.4 C)   Wt Readings from Last 3 Encounters:  08/31/16 141 lb (64 kg)  08/10/16 141 lb (64 kg)  02/02/16 143 lb 8 oz (65.1 kg)   Body mass index is 22.08 kg/m.   Physical Exam    Constitutional: Appears well-developed and well-nourished. No distress.  HENT:  Head: Normocephalic and atraumatic.  Neck: Neck supple. No tracheal deviation present. No thyromegaly present.  No cervical lymphadenopathy Cardiovascular: Normal rate, regular rhythm and normal heart sounds.   No murmur heard. No carotid bruit .  No edema Pulmonary/Chest: Effort normal and breath sounds normal. No respiratory distress. No has no wheezes. No rales.  Skin: Skin is warm and dry. Not diaphoretic.  Psychiatric: Normal mood and affect. Behavior is normal.      Assessment & Plan:    See Problem List for Assessment and Plan of chronic medical problems.

## 2016-12-05 ENCOUNTER — Ambulatory Visit: Payer: Medicare Other | Admitting: Nurse Practitioner

## 2016-12-06 ENCOUNTER — Encounter: Payer: Self-pay | Admitting: Nurse Practitioner

## 2016-12-29 ENCOUNTER — Ambulatory Visit: Payer: Medicare Other | Admitting: Internal Medicine

## 2017-01-17 ENCOUNTER — Ambulatory Visit: Payer: Medicare Other | Admitting: Internal Medicine

## 2017-01-17 NOTE — Progress Notes (Deleted)
Subjective:    Patient ID: [name] Fox, female    DOB: [birth year], 76 y.o.   MRN: [id number]  HPI She is here for an acute visit.   Nausea:   Medications and allergies reviewed with patient and updated if appropriate.  Patient Active Problem List   Diagnosis Date Noted  . Calf pain 02/02/2016  . Chest tightness 05/04/2015  . Numbness and tingling in right hand   . Hypertension 11/15/2013  . Weight loss, unintentional 04/12/2013  . Depression   . PMR (polymyalgia rheumatica) (HCC) 10/31/2011  . Vitamin B12 deficiency 08/31/2010  . Anxiety state 08/31/2010  . Dementia 08/31/2010  . Dyslipidemia 12/08/2009  . KELOID 11/19/2009  . HEARING LOSS, BILATERAL 08/20/2009  . 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 BRONCHIECTASIS WITHOUT ACUTE EXACERBATION 10/18/2007  . HERPES ZOSTER 09/10/2007  . ALLERGIC RHINITIS 04/06/2007  . FIBROCYSTIC BREAST DISEASE 04/06/2007  . Osteoporosis     Current Outpatient Prescriptions on File Prior to Visit  Medication Sig Dispense Refill  . amLODipine (NORVASC) 5 MG tablet TAKE 1 TABLET(5 MG) BY MOUTH DAILY 90 tablet 3  . Calcium Carbonate (CALCIUM 600 PO) Take 1,200 mg by mouth daily.    Marland Kitchen donepezil (ARICEPT) 10 MG tablet Take 1 tablet (10 mg total) by mouth at bedtime. 90 tablet 4  . loratadine (CLARITIN) 10 MG tablet Take 10 mg by mouth daily.    Marland Kitchen losartan (COZAAR) 100 MG tablet Take 1 tablet (100 mg total) by mouth daily. 90 tablet 3  . memantine (NAMENDA) 10 MG tablet Take 1 tablet (10 mg total) by mouth 2 (two) times daily. 180 tablet 4  . Omega-3 Fatty Acids (FISH OIL) 1000 MG CAPS Take 1,000 mg by mouth daily.      . pravastatin (PRAVACHOL) 20 MG tablet Take 1 tablet (20 mg total) by mouth daily. 90 tablet 3  . sertraline (ZOLOFT) 100 MG tablet TAKE 1 TABLET BY MOUTH DAILY 90 tablet 0  . triamcinolone ointment (KENALOG) 0.1 % USE 1 APPLICATION OF OINTMENT AA BID. APPLY BID TO TID.  1  . Vitamin D, Cholecalciferol,  1000 units TABS Take 1,000 Units by mouth daily.     No current facility-administered medications on file prior to visit.     Past Medical History:  Diagnosis Date  . Adenomatous colon polyp 05/26/1999   procedure at [facility] in [place], [place]   . ANXIETY   . DEGENERATIVE JOINT DISEASE, KNEES, BILATERAL   . Dementia 08/31/2010   neuropsyc eval 10/2012 and follows with neuro  . Diverticulosis   . DYSLIPIDEMIA   . Fibrocystic breast disease   . HEARING LOSS, BILATERAL   . Hemorrhoids   . HERPES ZOSTER   . OSTEOPENIA   . Osteoporosis    DEXA A LB 09/22/14: -2.4 R fem, progressive decline since 2011    . PMR (polymyalgia rheumatica) (HCC) dx 10/2011   Initially dx [facility], then PMR clarified -on pred, follows with rheum  . VITAMIN B12 DEFICIENCY     Past Surgical History:  Procedure Laterality Date  . Bilateral Mastectomy     while removing cosmetic silicone implants (NO Breast cancer)  . Bilateral silicone Breast explant  1993   Due to rupture/leak @ [place] History   Social History  . Marital status: Married    Spouse name: N/A  . Number of children: N/A  . Years of education: N/A   Social  History Main Topics  . Smoking status: Never Smoker  . Smokeless tobacco: Never Used  . Alcohol use 1.8 oz/week    3 Glasses of wine per week     Comment: Occassional white wine  . Drug use: No  . Sexual activity: Not Currently   Other Topics Concern  . Not on file   Social History Narrative   Divorced fall 2015, lives alone. adopted son nearby. Retired from public health-nutritionist    Family History  Problem Relation Age of Onset  . Pancreatic cancer Father   . Heart disease Brother   . Colon cancer Paternal Grandfather   . Colon cancer Paternal Aunt   . Inflammatory bowel disease Cousin     Review of Systems     Objective:  There were no vitals filed for this visit. There were no vitals filed for this visit. There is  no height or weight on file to calculate BMI.  Wt Readings from Last 3 Encounters:  08/31/16 141 lb (64 kg)  08/10/16 141 lb (64 kg)  02/02/16 143 lb 8 oz (65.1 kg)     Physical Exam        Assessment & Plan:   See Problem List for Assessment and Plan of chronic medical problems.

## 2017-01-20 ENCOUNTER — Ambulatory Visit: Payer: Medicare Other | Admitting: Internal Medicine

## 2017-02-03 ENCOUNTER — Telehealth: Payer: Self-pay | Admitting: Internal Medicine

## 2017-02-03 NOTE — Telephone Encounter (Signed)
Pt called stating that she has been itching on her stomach, back and arms. She thinks that it started around the time that her Amlodipine changed manufactures. She made an appointment to see Dr Ronnald Ramp on Monday morning.

## 2017-02-06 ENCOUNTER — Ambulatory Visit: Payer: Medicare Other | Admitting: Internal Medicine

## 2017-02-06 NOTE — Telephone Encounter (Signed)
Pt has made appt w/dr. Jenny Reichmann for 02/08/17...Johny Chess

## 2017-02-07 ENCOUNTER — Ambulatory Visit: Payer: Medicare Other | Admitting: Family

## 2017-02-08 ENCOUNTER — Ambulatory Visit: Payer: Medicare Other | Admitting: Internal Medicine

## 2017-02-22 ENCOUNTER — Ambulatory Visit (INDEPENDENT_AMBULATORY_CARE_PROVIDER_SITE_OTHER): Payer: Medicare Other | Admitting: Internal Medicine

## 2017-02-22 VITALS — BP 198/90 | HR 73 | Temp 97.9°F | Wt 131.0 lb

## 2017-02-22 DIAGNOSIS — R21 Rash and other nonspecific skin eruption: Secondary | ICD-10-CM | POA: Diagnosis not present

## 2017-02-22 DIAGNOSIS — I10 Essential (primary) hypertension: Secondary | ICD-10-CM | POA: Diagnosis not present

## 2017-02-22 NOTE — Progress Notes (Signed)
Subjective:    Patient ID: [name] Fox, female    DOB: 04/16/1941, 76 y.o.   MRN: [id number]  HPI She is here for an acute visit for elevated blood pressure and rash.  She has a rash throughout her body that is very itchy. The rash is all over.   It started about 6 weeks ago when her pharmacy change the manufacturer of her norvasc.  She thinks that is the cause.  She denies other changes in medication, supplements, lifestyle or environment.  She did not take the amlodipine today.  She is taking claritin.  She has been using a hydrocortisone 1% cream and it helps.  She denies other concerning symptoms.   Medications and allergies reviewed with patient and updated if appropriate.  Patient Active Problem List   Diagnosis Date Noted  . Calf pain 02/02/2016  . Chest tightness 05/04/2015  . Numbness and tingling in right hand   . Hypertension 11/15/2013  . Weight loss, unintentional 04/12/2013  . Depression   . PMR (polymyalgia rheumatica) (HCC) 10/31/2011  . Vitamin B12 deficiency 08/31/2010  . Anxiety state 08/31/2010  . Dementia 08/31/2010  . Dyslipidemia 12/08/2009  . KELOID 11/19/2009  . HEARING LOSS, BILATERAL 08/20/2009  . 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 BRONCHIECTASIS WITHOUT ACUTE EXACERBATION 10/18/2007  . HERPES ZOSTER 09/10/2007  . ALLERGIC RHINITIS 04/06/2007  . FIBROCYSTIC BREAST DISEASE 04/06/2007  . Osteoporosis     Current Outpatient Prescriptions on File Prior to Visit  Medication Sig Dispense Refill  . amLODipine (NORVASC) 5 MG tablet TAKE 1 TABLET(5 MG) BY MOUTH DAILY 90 tablet 3  . Calcium Carbonate (CALCIUM 600 PO) Take 1,200 mg by mouth daily.    Marland Kitchen donepezil (ARICEPT) 10 MG tablet Take 1 tablet (10 mg total) by mouth at bedtime. 90 tablet 4  . loratadine (CLARITIN) 10 MG tablet Take 10 mg by mouth daily.    Marland Kitchen losartan (COZAAR) 100 MG tablet Take 1 tablet (100 mg total) by mouth daily. 90 tablet 3  . memantine (NAMENDA) 10 MG  tablet Take 1 tablet (10 mg total) by mouth 2 (two) times daily. 180 tablet 4  . Omega-3 Fatty Acids (FISH OIL) 1000 MG CAPS Take 1,000 mg by mouth daily.      . pravastatin (PRAVACHOL) 20 MG tablet Take 1 tablet (20 mg total) by mouth daily. 90 tablet 3  . sertraline (ZOLOFT) 100 MG tablet TAKE 1 TABLET BY MOUTH DAILY 90 tablet 0  . triamcinolone ointment (KENALOG) 0.1 % USE 1 APPLICATION OF OINTMENT AA BID. APPLY BID TO TID.  1  . Vitamin D, Cholecalciferol, 1000 units TABS Take 1,000 Units by mouth daily.     No current facility-administered medications on file prior to visit.     Past Medical History:  Diagnosis Date  . Adenomatous colon polyp 05/26/1999   procedure at [facility] in [place], [place]   . ANXIETY   . DEGENERATIVE JOINT DISEASE, KNEES, BILATERAL   . Dementia 08/31/2010   neuropsyc eval 10/2012 and follows with neuro  . Diverticulosis   . DYSLIPIDEMIA   . Fibrocystic breast disease   . HEARING LOSS, BILATERAL   . Hemorrhoids   . HERPES ZOSTER   . OSTEOPENIA   . Osteoporosis    DEXA A LB 09/22/14: -2.4 R fem, progressive decline since 2011    . PMR (polymyalgia rheumatica) (HCC) dx 10/2011   Initially dx [facility], then PMR clarified -on pred,  follows with rheum  . VITAMIN B12 DEFICIENCY     Past Surgical History:  Procedure Laterality Date  . Bilateral Mastectomy     while removing cosmetic silicone implants (NO Breast cancer)  . Bilateral silicone Breast explant  1993   Due to rupture/leak @ [place] History   Social History  . Marital status: Married    Spouse name: N/A  . Number of children: N/A  . Years of education: N/A   Social History Main Topics  . Smoking status: Never Smoker  . Smokeless tobacco: Never Used  . Alcohol use 1.8 oz/week    3 Glasses of wine per week     Comment: Occassional white wine  . Drug use: No  . Sexual activity: Not Currently   Other Topics Concern  . None   Social History  Narrative   Divorced fall 2015, lives alone. adopted son nearby. Retired from public health-nutritionist    Family History  Problem Relation Age of Onset  . Pancreatic cancer Father   . Heart disease Brother   . Colon cancer Paternal Grandfather   . Colon cancer Paternal Aunt   . Inflammatory bowel disease Cousin     Review of Systems  Constitutional: Negative for fever.  HENT: Negative for sore throat and trouble swallowing.   Respiratory: Negative for cough, shortness of breath and wheezing.   Cardiovascular: Negative for chest pain, palpitations and leg swelling.  Neurological: Negative for light-headedness and headaches.       Objective:   Vitals:   02/23/17 1919  BP: (!) 198/90  Pulse: 73  Temp: 97.9 F (36.6 C)  SpO2: 96%   Filed Weights   02/23/17 1919  Weight: 131 lb (59.4 kg)   Body mass index is 20.52 kg/m.  Wt Readings from Last 3 Encounters:  02/23/17 131 lb (59.4 kg)  08/31/16 141 lb (64 kg)  08/10/16 141 lb (64 kg)     Physical Exam Constitutional: Appears well-developed and well-nourished. No distress.  HENT:  Head: Normocephalic and atraumatic.  Neck: Neck supple. No tracheal deviation present. No thyromegaly present.  no oropharynx erythema or swelling.  No cervical lymphadenopathy Cardiovascular: Normal rate, regular rhythm and normal heart sounds.   No murmur heard. No carotid bruit .  No edema Pulmonary/Chest: Effort normal and breath sounds normal. No respiratory distress. No has no wheezes. No rales.  Skin: Skin is warm and dry. Not diaphoretic.  Psychiatric: Normal mood and affect. Behavior is normal.         Assessment & Plan:   See Problem List for Assessment and Plan of chronic medical problems.

## 2017-02-23 ENCOUNTER — Encounter: Payer: Self-pay | Admitting: Internal Medicine

## 2017-02-23 DIAGNOSIS — R21 Rash and other nonspecific skin eruption: Secondary | ICD-10-CM | POA: Insufficient documentation

## 2017-02-23 NOTE — Assessment & Plan Note (Signed)
?   Related to new norvasc manufacturer No other known possible causes Will d/c norvasc Continue claritin Medrol dose pak Call if no improvement

## 2017-02-23 NOTE — Assessment & Plan Note (Signed)
D/c amlodipine since the rash may be due to that Continue losartan Start bystolic 5 mg daily - samples given Monitor BP F/u in 3 weeks

## 2017-02-23 NOTE — Patient Instructions (Signed)
Continue claritin Take medrol dose pak  Stop norvasc Continue losartan Start bystolic - samples given  Monitor BP closely  Follow u pin 3 weeks

## 2017-05-27 ENCOUNTER — Encounter: Payer: Self-pay | Admitting: Family Medicine

## 2017-05-27 ENCOUNTER — Ambulatory Visit: Payer: Medicare Other | Admitting: Family Medicine

## 2017-05-27 VITALS — BP 166/90 | HR 72 | Temp 97.6°F | Wt 120.0 lb

## 2017-05-27 DIAGNOSIS — R21 Rash and other nonspecific skin eruption: Secondary | ICD-10-CM

## 2017-05-27 DIAGNOSIS — I10 Essential (primary) hypertension: Secondary | ICD-10-CM

## 2017-05-27 DIAGNOSIS — L853 Xerosis cutis: Secondary | ICD-10-CM | POA: Diagnosis not present

## 2017-05-27 MED ORDER — TRIAMCINOLONE ACETONIDE 0.1 % EX CREA: TOPICAL_CREAM | CUTANEOUS | 1 refills | Status: AC

## 2017-05-27 NOTE — Patient Instructions (Addendum)
Try to avoid prolonged bathing Continue with Dove soap Use medicated cream twice daily to areas of affected rash Avoid scratching as much as possible Follow up with Dr Quay Burow within 2 weeks. STOP the Amlodipine and Start Bystolic 5 mg once daily.

## 2017-05-27 NOTE — Progress Notes (Signed)
Subjective:     Patient ID: [name] Fox, female   DOB: [birth year], 76 y.o.   MRN: [id number]  HPI Patient is seen Saturday clinic with progressive skin rash. She was seen here in August and was felt that might be having a reaction to amlodipine and she was instructed to discontinue amlodipine and start Bystolic. Patient never did. She has fairly advanced dementia. She is here with daughter-in-law today who is helping to coordinate some care issues for her. Daughter-in-law lives in [place] and is visiting.  Patient apparently consumes a fair amount of wine (up to a bottle per day per family) and they have some concerns regarding that. She lives with her son. Son is supposed to be helping with medications but not apparently consistently. Patient has rash involving her trunk and upper lower extremities. She uses [employer]. She has used over-the-counter hydrocortisone cream without much improvement. Her chronic problems include history of  dementia, osteoporosis, hypertension, B12 deficiency. She apparently is not taking B12 replacement regularly  Past Medical History:  Diagnosis Date  . Adenomatous colon polyp 05/26/1999   procedure at [facility] in [place], [place]   . ANXIETY   . DEGENERATIVE JOINT DISEASE, KNEES, BILATERAL   . Dementia 08/31/2010   neuropsyc eval 10/2012 and follows with neuro  . Diverticulosis   . DYSLIPIDEMIA   . Fibrocystic breast disease   . HEARING LOSS, BILATERAL   . Hemorrhoids   . HERPES ZOSTER   . OSTEOPENIA   . Osteoporosis    DEXA A LB 09/22/14: -2.4 R fem, progressive decline since 2011    . PMR (polymyalgia rheumatica) (HCC) dx 10/2011   Initially dx GCA, then PMR clarified -on pred, follows with rheum  . VITAMIN B12 DEFICIENCY    Past Surgical History:  Procedure Laterality Date  . Bilateral Mastectomy     while removing cosmetic silicone implants (NO Breast cancer)  . Bilateral silicone Breast explant  1993   Due to  rupture/leak @ [place]    reports that  has never smoked. she has never used smokeless tobacco. She reports that she drinks about 1.8 oz of alcohol per week. She reports that she does not use drugs. family history includes Colon cancer in her paternal aunt and paternal grandfather; Heart disease in her brother; Inflammatory bowel disease in her cousin; Pancreatic cancer in her father. Allergies  Allergen Reactions  . Penicillins Shortness Of Breath, Swelling and Rash  . Sulfasalazine Rash     Review of Systems  Constitutional: Negative for chills and fever.  Respiratory: Negative for cough.   Cardiovascular: Negative for chest pain.  Gastrointestinal: Negative for abdominal pain.  Genitourinary: Negative for dysuria.  Skin: Positive for rash.  Neurological: Negative for dizziness and headaches.       Objective:   Physical Exam  Constitutional: She appears well-developed and well-nourished.  Cardiovascular: Normal rate.  Musculoskeletal: She exhibits no edema.  Skin:  Patient has extensive skin rash especially involving her forearms and arms as well as lower legs and trunk region. Very excoriated and very dry scan. No urticaria.       Assessment:     #1 hypertension with history of poor control. Questionable compliance. Probably exacerbated by excessive alcohol consumption with wine  #2 progressive pruritic skin rash with multiple excoriations. No signs of secondary infection. Previous concern for possible problems with amlodipine. Suspect rash exacerbated by dryness and excoriations    Plan:     -Needs  close follow-up with primary provider and we've recommended she schedule follow-up in 2 weeks -Discontinue amlodipine and start Bystolic 5 mg daily with samples provided for 2 weeks -Avoid prolonged bathing -Triamcinolone compounded with Eucerin 1:1 and apply twice daily as needed -Continue with gentle soap such as Hulan Fray -We've encouraged family to try to restrict her  alcohol consumption which may be exacerbating her blood pressure  Eulas Post MD [facility]

## 2017-05-28 NOTE — Progress Notes (Deleted)
Subjective:    Patient ID: [name] Fox, female    DOB: Jan 21, 1941, 76 y.o.   MRN: [id number]  HPI She is here for follow up.   She was seen last Saturday for a rash.  It is a pruritic rash with multiple excoriations.  There were no signs of infection.  The rash looked like it was exacerbated by dryness and excoriations.    She had a rash back in August and I advised her to stop the amlodipine which may have potentially been the cause.  She did not do this at the time.  She was advised to do this last Saturday.  She was advised to avoid prolonged bathing.  She was instructed to use triamcinolone compounded with Eucerin 1:1 twice daily.    Medications and allergies reviewed with patient and updated if appropriate.  Patient Active Problem List   Diagnosis Date Noted  . Rash and nonspecific skin eruption 02/23/2017  . Calf pain 02/02/2016  . Chest tightness 05/04/2015  . Numbness and tingling in right hand   . Hypertension 11/15/2013  . Weight loss, unintentional 04/12/2013  . Depression   . PMR (polymyalgia rheumatica) (HCC) 10/31/2011  . Vitamin B12 deficiency 08/31/2010  . Anxiety state 08/31/2010  . Dementia 08/31/2010  . Dyslipidemia 12/08/2009  . KELOID 11/19/2009  . HEARING LOSS, BILATERAL 08/20/2009  . 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 BRONCHIECTASIS WITHOUT ACUTE EXACERBATION 10/18/2007  . HERPES ZOSTER 09/10/2007  . ALLERGIC RHINITIS 04/06/2007  . FIBROCYSTIC BREAST DISEASE 04/06/2007  . Osteoporosis     Current Outpatient Medications on File Prior to Visit  Medication Sig Dispense Refill  . Calcium Carbonate (CALCIUM 600 PO) Take 1,200 mg by mouth daily.    Marland Kitchen donepezil (ARICEPT) 10 MG tablet Take 1 tablet (10 mg total) by mouth at bedtime. 90 tablet 4  . loratadine (CLARITIN) 10 MG tablet Take 10 mg by mouth daily.    Marland Kitchen losartan (COZAAR) 100 MG tablet Take 1 tablet (100 mg total) by mouth daily. 90 tablet 3  . memantine (NAMENDA) 10 MG  tablet Take 1 tablet (10 mg total) by mouth 2 (two) times daily. 180 tablet 4  . nebivolol (BYSTOLIC) 5 MG tablet Take 5 mg daily by mouth.    . Omega-3 Fatty Acids (FISH OIL) 1000 MG CAPS Take 1,000 mg by mouth daily.      . pravastatin (PRAVACHOL) 20 MG tablet Take 1 tablet (20 mg total) by mouth daily. 90 tablet 3  . sertraline (ZOLOFT) 100 MG tablet TAKE 1 TABLET BY MOUTH DAILY 90 tablet 0  . triamcinolone cream (KENALOG) 0.1 % Compound 1:1 with Eucerin cream and apply bid as needed to affected rash 453 g 1  . triamcinolone ointment (KENALOG) 0.1 % USE 1 APPLICATION OF OINTMENT AA BID. APPLY BID TO TID.  1  . Vitamin D, Cholecalciferol, 1000 units TABS Take 1,000 Units by mouth daily.     No current facility-administered medications on file prior to visit.     Past Medical History:  Diagnosis Date  . Adenomatous colon polyp 05/26/1999   procedure at [facility] in [place], [place]   . ANXIETY   . DEGENERATIVE JOINT DISEASE, KNEES, BILATERAL   . Dementia 08/31/2010   neuropsyc eval 10/2012 and follows with neuro  . Diverticulosis   . DYSLIPIDEMIA   . Fibrocystic breast disease   . HEARING LOSS, BILATERAL   . Hemorrhoids   . HERPES ZOSTER   .  OSTEOPENIA   . Osteoporosis    DEXA A LB 09/22/14: -2.4 R fem, progressive decline since 2011    . PMR (polymyalgia rheumatica) (HCC) dx 10/2011   Initially dx GCA, then PMR clarified -on pred, follows with rheum  . VITAMIN B12 DEFICIENCY     Past Surgical History:  Procedure Laterality Date  . Bilateral Mastectomy     while removing cosmetic silicone implants (NO Breast cancer)  . Bilateral silicone Breast explant  1993   Due to rupture/leak @ [place] History   Socioeconomic History  . Marital status: Married    Spouse name: Not on file  . Number of children: Not on file  . Years of education: Not on file  . Highest education level: Not on file  Social Needs  . Financial resource  strain: Not on file  . Food insecurity - worry: Not on file  . Food insecurity - inability: Not on file  . Transportation needs - medical: Not on file  . Transportation needs - non-medical: Not on file  Occupational History  . Not on file  Tobacco Use  . Smoking status: Never Smoker  . Smokeless tobacco: Never Used  Substance and Sexual Activity  . Alcohol use: Yes    Alcohol/week: 1.8 oz    Types: 3 Glasses of wine per week    Comment: Occassional white wine  . Drug use: No  . Sexual activity: Not Currently  Other Topics Concern  . Not on file  Social History Narrative   Divorced fall 2015, lives alone. adopted son nearby. Retired from public health-nutritionist    Family History  Problem Relation Age of Onset  . Pancreatic cancer Father   . Heart disease Brother   . Colon cancer Paternal Grandfather   . Colon cancer Paternal Aunt   . Inflammatory bowel disease Cousin     Review of Systems     Objective:  There were no vitals filed for this visit. There were no vitals filed for this visit. There is no height or weight on file to calculate BMI.  Wt Readings from Last 3 Encounters:  05/27/17 120 lb (54.4 kg)  02/23/17 131 lb (59.4 kg)  08/31/16 141 lb (64 kg)     Physical Exam        Assessment & Plan:   See Problem List for Assessment and Plan of chronic medical problems.

## 2017-05-29 ENCOUNTER — Telehealth: Payer: Self-pay | Admitting: Internal Medicine

## 2017-05-29 NOTE — Telephone Encounter (Signed)
Pt sister in law Dr. Parke Simmers (not on DPR) called stating she had spent several days with the patient and would like to report some concerns  The patient is living alone with Stage 5 alzheimer's and is constantly falling, she forgets her medications and takes them twice, she wanders around and the neighbors take her back home,  She is drinking 1-2 bottles of wine a day  And some other concerns  Please call back or Son on [place]  Please advise

## 2017-05-30 ENCOUNTER — Ambulatory Visit: Payer: Medicare Other | Admitting: Internal Medicine

## 2017-05-30 NOTE — Telephone Encounter (Signed)
Pts sister in law called in again, she is very concerned that pts dementia is getting worse and that pt is not taking meds as she is supposed to. States son lives with her but does not have the mental capacity to take care of her. She would like pt evaluated for a long term facility. I advised that we could suggest these things to the pt but could not force the pt into treatment. I also advised that they needed to take the time and discuss this with the pt as well.

## 2017-05-30 NOTE — Progress Notes (Signed)
Subjective:    Patient ID: [name] Fox, female    DOB: 04-15-1941, 76 y.o.   MRN: [id number]  HPI She is here for follow up.  She is here today with her son, whom she lives with.  Rash:  She was seen last Saturday for a rash.  It was  a pruritic rash with multiple excoriations.  There were no signs of infection.  The rash looked like it was exacerbated by dryness and excoriations.  She had a rash back in August and I advised her to stop the amlodipine which may have potentially been the cause.  She did not do this at the time.  She was advised to do this last Saturday.  She was advised to avoid prolonged bathing.  She was instructed to use triamcinolone compounded with Eucerin 1:1 twice daily.  The rash is better.  The cream has helped.  She has minimal itching.  Dementia:  Has seen Dr Krista Blue, but visits not up to date.  She is taking aricept.  They are not sure if she is taking her namenda.    B12 def:  She is unsure if she is taking B12.    Depression, anxiety:  She is taking the sertraline nightly.  Her son feels her depression and anxiety are not controlled.    Hyperlipidemia: She is taking her medication daily. She is compliant with a low fat/cholesterol diet. She is not exercising regularly. She denies myalgias.   Hypertension: She is taking her medication daily. She is compliant with a low sodium diet.  She denies chest pain, palpitations, edema, shortness of breath and regular headaches. She is not exercising regularly.  She does not monitor her blood pressure at home.    She does not exercise, but is active with her household activities.  She is drinking a lot - she denies this, but her son admits she can drink a bottle of wine in a night. She no longer drives.    Medications and allergies reviewed with patient and updated if appropriate.  Patient Active Problem List   Diagnosis Date Noted  . Rash and nonspecific skin eruption 02/23/2017  . Calf pain 02/02/2016  . Chest  tightness 05/04/2015  . Numbness and tingling in right hand   . Hypertension 11/15/2013  . Weight loss, unintentional 04/12/2013  . Depression   . PMR (polymyalgia rheumatica) (HCC) 10/31/2011  . Vitamin B12 deficiency 08/31/2010  . Anxiety state 08/31/2010  . Dementia 08/31/2010  . Dyslipidemia 12/08/2009  . KELOID 11/19/2009  . HEARING LOSS, BILATERAL 08/20/2009  . 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 BRONCHIECTASIS WITHOUT ACUTE EXACERBATION 10/18/2007  . HERPES ZOSTER 09/10/2007  . ALLERGIC RHINITIS 04/06/2007  . FIBROCYSTIC BREAST DISEASE 04/06/2007  . Osteoporosis     Current Outpatient Medications on File Prior to Visit  Medication Sig Dispense Refill  . Calcium Carbonate (CALCIUM 600 PO) Take 1,200 mg by mouth daily.    Marland Kitchen donepezil (ARICEPT) 10 MG tablet Take 1 tablet (10 mg total) by mouth at bedtime. 90 tablet 4  . loratadine (CLARITIN) 10 MG tablet Take 10 mg by mouth daily.    Marland Kitchen losartan (COZAAR) 100 MG tablet Take 1 tablet (100 mg total) by mouth daily. 90 tablet 3  . memantine (NAMENDA) 10 MG tablet Take 1 tablet (10 mg total) by mouth 2 (two) times daily. 180 tablet 4  . nebivolol (BYSTOLIC) 5 MG tablet Take 5 mg daily by mouth.    . Omega-3  Fatty Acids (FISH OIL) 1000 MG CAPS Take 1,000 mg by mouth daily.      . pravastatin (PRAVACHOL) 20 MG tablet Take 1 tablet (20 mg total) by mouth daily. 90 tablet 3  . sertraline (ZOLOFT) 100 MG tablet TAKE 1 TABLET BY MOUTH DAILY 90 tablet 0  . triamcinolone cream (KENALOG) 0.1 % Compound 1:1 with Eucerin cream and apply bid as needed to affected rash 453 g 1  . Vitamin D, Cholecalciferol, 1000 units TABS Take 1,000 Units by mouth daily.     No current facility-administered medications on file prior to visit.     Past Medical History:  Diagnosis Date  . Adenomatous colon polyp 05/26/1999   procedure at [facility] in [place], [place]   . ANXIETY   . DEGENERATIVE JOINT  DISEASE, KNEES, BILATERAL   . Dementia 08/31/2010   neuropsyc eval 10/2012 and follows with neuro  . Diverticulosis   . DYSLIPIDEMIA   . Fibrocystic breast disease   . HEARING LOSS, BILATERAL   . Hemorrhoids   . HERPES ZOSTER   . OSTEOPENIA   . Osteoporosis    DEXA A LB 09/22/14: -2.4 R fem, progressive decline since 2011    . PMR (polymyalgia rheumatica) (HCC) dx 10/2011   Initially dx GCA, then PMR clarified -on pred, follows with rheum  . VITAMIN B12 DEFICIENCY     Past Surgical History:  Procedure Laterality Date  . Bilateral Mastectomy     while removing cosmetic silicone implants (NO Breast cancer)  . Bilateral silicone Breast explant  1993   Due to rupture/leak @ [place] History   Socioeconomic History  . Marital status: Married    Spouse name: Not on file  . Number of children: Not on file  . Years of education: Not on file  . Highest education level: Not on file  Social Needs  . Financial resource strain: Not on file  . Food insecurity - worry: Not on file  . Food insecurity - inability: Not on file  . Transportation needs - medical: Not on file  . Transportation needs - non-medical: Not on file  Occupational History  . Not on file  Tobacco Use  . Smoking status: Never Smoker  . Smokeless tobacco: Never Used  Substance and Sexual Activity  . Alcohol use: Yes    Alcohol/week: 1.8 oz    Types: 3 Glasses of wine per week    Comment: Occassional white wine  . Drug use: No  . Sexual activity: Not Currently  Other Topics Concern  . Not on file  Social History Narrative   Divorced fall 2015, lives alone. adopted son nearby. Retired from public health-nutritionist    Family History  Problem Relation Age of Onset  . Pancreatic cancer Father   . Heart disease Brother   . Colon cancer Paternal Grandfather   . Colon cancer Paternal Aunt   . Inflammatory bowel disease Cousin     Review of Systems  Constitutional: Negative for fever.    Respiratory: Positive for cough (daily). Negative for shortness of breath and wheezing.   Cardiovascular: Negative for chest pain, palpitations and leg swelling.  Gastrointestinal: Positive for abdominal distention (intermittent) and nausea (intermittent). Negative for abdominal pain.       No gerd  Neurological: Positive for headaches (past couple of days.). Negative for dizziness and light-headedness.  Psychiatric/Behavioral: Positive for dysphoric mood. The patient is nervous/anxious.  Objective:   Vitals:   05/31/17 0945  BP: (!) 154/90  Pulse: 76  Resp: 16  Temp: 97.6 F (36.4 C)  SpO2: 97%   Filed Weights   05/31/17 0945  Weight: 120 lb (54.4 kg)   Body mass index is 18.79 kg/m.  Wt Readings from Last 3 Encounters:  05/31/17 120 lb (54.4 kg)  05/27/17 120 lb (54.4 kg)  02/23/17 131 lb (59.4 kg)     Physical Exam Constitutional: Appears well-developed and well-nourished. No distress.  HENT:  Head: Normocephalic and atraumatic.  Neck: Neck supple. No tracheal deviation present. No thyromegaly present.  No cervical lymphadenopathy Cardiovascular: Normal rate, regular rhythm and normal heart sounds.   No murmur heard. No carotid bruit .  No edema Pulmonary/Chest: Effort normal and breath sounds normal. No respiratory distress. No has no wheezes. No rales.  Skin: Skin is warm and dry. Not diaphoretic. fading rash on arm and legs - improved per patient Psychiatric: Normal mood and affect. Behavior is normal.         Assessment & Plan:   See Problem List for Assessment and Plan of chronic medical problems.

## 2017-05-30 NOTE — Telephone Encounter (Signed)
Noted.  Can call someone on DPR and let them know she did not show for her appt.  If she does not want any help we are limited in what we can do

## 2017-05-31 ENCOUNTER — Encounter: Payer: Self-pay | Admitting: Internal Medicine

## 2017-05-31 ENCOUNTER — Ambulatory Visit: Payer: Medicare Other | Admitting: Internal Medicine

## 2017-05-31 ENCOUNTER — Other Ambulatory Visit (INDEPENDENT_AMBULATORY_CARE_PROVIDER_SITE_OTHER): Payer: Medicare Other

## 2017-05-31 VITALS — BP 154/90 | HR 76 | Temp 97.6°F | Resp 16 | Wt 120.0 lb

## 2017-05-31 DIAGNOSIS — F329 Major depressive disorder, single episode, unspecified: Secondary | ICD-10-CM

## 2017-05-31 DIAGNOSIS — E538 Deficiency of other specified B group vitamins: Secondary | ICD-10-CM | POA: Diagnosis not present

## 2017-05-31 DIAGNOSIS — E785 Hyperlipidemia, unspecified: Secondary | ICD-10-CM | POA: Diagnosis not present

## 2017-05-31 DIAGNOSIS — R21 Rash and other nonspecific skin eruption: Secondary | ICD-10-CM

## 2017-05-31 DIAGNOSIS — F0390 Unspecified dementia without behavioral disturbance: Secondary | ICD-10-CM

## 2017-05-31 DIAGNOSIS — F411 Generalized anxiety disorder: Secondary | ICD-10-CM | POA: Diagnosis not present

## 2017-05-31 DIAGNOSIS — I10 Essential (primary) hypertension: Secondary | ICD-10-CM

## 2017-05-31 DIAGNOSIS — F32A Depression, unspecified: Secondary | ICD-10-CM

## 2017-05-31 LAB — COMPREHENSIVE METABOLIC PANEL
ALBUMIN: 4 g/dL (ref 3.5–5.2)
ALT: 42 U/L — AB (ref 0–35)
AST: 66 U/L — AB (ref 0–37)
Alkaline Phosphatase: 50 U/L (ref 39–117)
BILIRUBIN TOTAL: 0.9 mg/dL (ref 0.2–1.2)
BUN: 9 mg/dL (ref 6–23)
CALCIUM: 9.2 mg/dL (ref 8.4–10.5)
CHLORIDE: 95 meq/L — AB (ref 96–112)
CO2: 28 meq/L (ref 19–32)
CREATININE: 0.84 mg/dL (ref 0.40–1.20)
GFR: 70.32 mL/min (ref >60.00)
Glucose, Bld: 86 mg/dL (ref 70–99)
Potassium: 3.6 mEq/L (ref 3.5–5.1)
SODIUM: 134 meq/L — AB (ref 135–145)
Total Protein: 6.9 g/dL (ref 6.0–8.3)

## 2017-05-31 LAB — CBC WITH DIFFERENTIAL/PLATELET
BASOS PCT: 1 % (ref 0.0–3.0)
Basophils Absolute: 0.1 10*3/uL (ref 0.0–0.1)
Eosinophils Absolute: 0.1 10*3/uL (ref 0.0–0.7)
Eosinophils Relative: 1.3 % (ref 0.0–5.0)
HCT: 39.7 % (ref 36.0–46.0)
HEMOGLOBIN: 13.4 g/dL (ref 12.0–15.0)
LYMPHS PCT: 15.2 % (ref 12.0–46.0)
Lymphs Abs: 0.8 10*3/uL (ref 0.7–4.0)
MCHC: 33.6 g/dL (ref 30.0–36.0)
MCV: 104.7 fl — AB (ref 78.0–100.0)
Monocytes Absolute: 0.5 10*3/uL (ref 0.1–1.0)
Monocytes Relative: 8.7 % (ref 3.0–12.0)
Neutro Abs: 4 10*3/uL (ref 1.4–7.7)
Neutrophils Relative %: 73.8 % (ref 43.0–77.0)
Platelets: 242 10*3/uL (ref 150.0–400.0)
RBC: 3.79 Mil/uL — AB (ref 3.87–5.11)
RDW: 15.6 % — ABNORMAL HIGH (ref 11.5–15.5)
WBC: 5.4 10*3/uL (ref 4.0–10.5)

## 2017-05-31 LAB — LIPID PANEL
CHOL/HDL RATIO: 2
Cholesterol: 233 mg/dL — ABNORMAL HIGH (ref 0–200)
HDL: 105.8 mg/dL (ref >39.00)
LDL CALC: 116 mg/dL — AB (ref 0–99)
NONHDL: 127.5
Triglycerides: 57 mg/dL (ref 0.0–149.0)
VLDL: 11.4 mg/dL (ref 0.0–40.0)

## 2017-05-31 LAB — TSH: TSH: 3.11 u[IU]/mL (ref 0.35–4.50)

## 2017-05-31 LAB — VITAMIN B12: Vitamin B-12: 233 pg/mL (ref 211–911)

## 2017-05-31 LAB — HEMOGLOBIN A1C: HEMOGLOBIN A1C: 5.8 % (ref 4.6–6.5)

## 2017-05-31 MED ORDER — SERTRALINE HCL 100 MG PO TABS: 150.0000 mg | ORAL_TABLET | Freq: Every day | ORAL | 1 refills | Status: AC

## 2017-05-31 MED ORDER — DONEPEZIL HCL 10 MG PO TABS: 10.0000 mg | ORAL_TABLET | Freq: Every day | ORAL | 1 refills | Status: AC

## 2017-05-31 MED ORDER — SERTRALINE HCL 100 MG PO TABS: 100.0000 mg | ORAL_TABLET | Freq: Every day | ORAL | 1 refills | Status: DC

## 2017-05-31 MED ORDER — NEBIVOLOL HCL 10 MG PO TABS: 10.0000 mg | ORAL_TABLET | Freq: Every day | ORAL | 5 refills | Status: AC

## 2017-05-31 NOTE — Assessment & Plan Note (Signed)
Following with with Dr Krista Blue - but overdue for an appt - her son will call and schedule one Continue aricept - they will check to see if she is taking the namenda Stressed taking B12 - has been very low in past - not sure if she is taking any Encouraged regular exercise Avoid alcohol

## 2017-05-31 NOTE — Assessment & Plan Note (Signed)
Related to amlodipine - improved since stopping the medication and using the triamcinolone  - eucerin combination

## 2017-05-31 NOTE — Patient Instructions (Addendum)
Please schedule a follow up with neurology with Dr Krista Blue.  Test(s) ordered today. Your results will be released to [place] (or called to you) after review, usually within 72hours after test completion. If any changes need to be made, you will be notified at that same time.  No immunizations administered today.   Medications reviewed and updated.  Changes include increasing sertraline to 150 mg daily. bystolic increased to 10 mg daily.  Your prescription(s) have been submitted to your pharmacy. Please take as directed and contact our office if you believe you are having problem(s) with the medication(s).  Please followup in 1 months

## 2017-05-31 NOTE — Assessment & Plan Note (Signed)
?   Taking  Will check level

## 2017-05-31 NOTE — Assessment & Plan Note (Signed)
Not controlled Increase sertraline to 150 mg

## 2017-05-31 NOTE — Telephone Encounter (Signed)
Pts appt was rescheduled to today at 930. Pt did show to appt today.

## 2017-05-31 NOTE — Assessment & Plan Note (Signed)
continue statin

## 2017-06-01 ENCOUNTER — Encounter: Payer: Self-pay | Admitting: Internal Medicine

## 2017-06-14 ENCOUNTER — Telehealth: Payer: Self-pay | Admitting: Internal Medicine

## 2017-06-14 DIAGNOSIS — Z91199 Patient's noncompliance with other medical treatment and regimen due to unspecified reason: Secondary | ICD-10-CM

## 2017-06-14 DIAGNOSIS — F0390 Unspecified dementia without behavioral disturbance: Secondary | ICD-10-CM

## 2017-06-14 DIAGNOSIS — R4189 Other symptoms and signs involving cognitive functions and awareness: Secondary | ICD-10-CM

## 2017-06-14 DIAGNOSIS — Z9119 Patient's noncompliance with other medical treatment and regimen: Secondary | ICD-10-CM

## 2017-06-14 NOTE — Telephone Encounter (Signed)
ordered

## 2017-06-14 NOTE — Telephone Encounter (Signed)
Copied from [place]. Topic: General - Other >> Jun 14, 2017  3:11 PM Vernona Rieger wrote: Janann August, NP [facility] & went out and did a AWV on her today. She said she has some issues going on. She has dementia, she couldn't find any of her medicines. Her neighbor came in and checked on her while she was there. He told her that her brother that lives in [place] about 3 weeks ago and filled her med box. She took everything in the pill box like she should (could have been hit or miss). She is without her meds & has been. She can not take care of herself. She is wondering if Dr Jenny Reichmann could put in a order for a home health nurse to come out and do a safety assesment and medication mgt. Her son lives with her but she cant saying her son was asleep while she was there. The neighbor told Layne that her son does nothing for her. Layne stated she doesn't eat. She said anyone can call her @ [phone]

## 2017-06-22 ENCOUNTER — Telehealth: Payer: Self-pay | Admitting: Internal Medicine

## 2017-06-22 NOTE — Telephone Encounter (Signed)
Copied from [place] [id number]. Topic: Quick Communication - See Telephone Encounter >> Jun 22, 2017  4:38 PM Ivar Drape wrote: CRM for notification. See Telephone encounter for:  06/22/17. Malachy Mood w/[facility] [phone] wanted to let the PCP know the patient is not taking Vit B12 and she wants to know what to put her on.

## 2017-06-23 NOTE — Telephone Encounter (Signed)
LVM with [name] Fox Mood informing her of MDs note

## 2017-06-23 NOTE — Telephone Encounter (Signed)
Should take 1000 mcg of B12 daily

## 2017-06-27 NOTE — Progress Notes (Deleted)
Subjective:    Patient ID: [name] Fox, female    DOB: 1941/02/10, 76 y.o.   MRN: [id number]  HPI The patient is here for follow up.  Depression: we increased her sertraline one month ago.  She is taking her medication daily as prescribed. She denies any side effects from the medication. She feels her depression is well controlled and she is happy with her current dose of medication.   Hyperlipidemia: She is taking her medication daily. She is compliant with a low fat/cholesterol diet. She is exercising regularly. She denies myalgias.   Hypertension: We increased her medication one month ago.  She is taking her medication daily. She is compliant with a low sodium diet.  She denies chest pain, palpitations, edema, shortness of breath and regular headaches. She is exercising regularly.  She does not monitor her blood pressure at home.    Anxiety: She is taking her medication daily as prescribed. She denies any side effects from the medication. She feels her anxiety is well controlled and she is happy with her current dose of medication.      Medications and allergies reviewed with patient and updated if appropriate.  Patient Active Problem List   Diagnosis Date Noted  . Rash and nonspecific skin eruption 02/23/2017  . Chest tightness 05/04/2015  . Numbness and tingling in right hand   . Hypertension 11/15/2013  . Weight loss, unintentional 04/12/2013  . Depression   . PMR (polymyalgia rheumatica) (HCC) 10/31/2011  . Vitamin B12 deficiency 08/31/2010  . Anxiety state 08/31/2010  . Dementia 08/31/2010  . Dyslipidemia 12/08/2009  . KELOID 11/19/2009  . HEARING LOSS, BILATERAL 08/20/2009  . 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 BRONCHIECTASIS WITHOUT ACUTE EXACERBATION 10/18/2007  . HERPES ZOSTER 09/10/2007  . ALLERGIC RHINITIS 04/06/2007  . FIBROCYSTIC BREAST DISEASE 04/06/2007  . Osteoporosis     Current Outpatient Medications on File Prior to Visit    Medication Sig Dispense Refill  . Calcium Carbonate (CALCIUM 600 PO) Take 1,200 mg by mouth daily.    Marland Kitchen donepezil (ARICEPT) 10 MG tablet Take 1 tablet (10 mg total) at bedtime by mouth. 90 tablet 1  . loratadine (CLARITIN) 10 MG tablet Take 10 mg by mouth daily.    Marland Kitchen losartan (COZAAR) 100 MG tablet Take 1 tablet (100 mg total) by mouth daily. 90 tablet 3  . memantine (NAMENDA) 10 MG tablet Take 1 tablet (10 mg total) by mouth 2 (two) times daily. 180 tablet 4  . nebivolol (BYSTOLIC) 10 MG tablet Take 1 tablet (10 mg total) daily by mouth. 30 tablet 5  . Omega-3 Fatty Acids (FISH OIL) 1000 MG CAPS Take 1,000 mg by mouth daily.      . pravastatin (PRAVACHOL) 20 MG tablet Take 1 tablet (20 mg total) by mouth daily. 90 tablet 3  . sertraline (ZOLOFT) 100 MG tablet Take 1.5 tablets (150 mg total) daily by mouth. 135 tablet 1  . triamcinolone cream (KENALOG) 0.1 % Compound 1:1 with Eucerin cream and apply bid as needed to affected rash 453 g 1  . Vitamin D, Cholecalciferol, 1000 units TABS Take 1,000 Units by mouth daily.     No current facility-administered medications on file prior to visit.     Past Medical History:  Diagnosis Date  . Adenomatous colon polyp 05/26/1999   procedure at [facility] in [place], [place]   . ANXIETY   . DEGENERATIVE JOINT DISEASE, KNEES, BILATERAL   .  Dementia 08/31/2010   neuropsyc eval 10/2012 and follows with neuro  . Diverticulosis   . DYSLIPIDEMIA   . Fibrocystic breast disease   . HEARING LOSS, BILATERAL   . Hemorrhoids   . HERPES ZOSTER   . OSTEOPENIA   . Osteoporosis    DEXA A LB 09/22/14: -2.4 R fem, progressive decline since 2011    . PMR (polymyalgia rheumatica) (HCC) dx 10/2011   Initially dx GCA, then PMR clarified -on pred, follows with rheum  . VITAMIN B12 DEFICIENCY     Past Surgical History:  Procedure Laterality Date  . Bilateral Mastectomy     while removing cosmetic silicone implants (NO Breast cancer)   . Bilateral silicone Breast explant  1993   Due to rupture/leak @ [place] History   Socioeconomic History  . Marital status: Married    Spouse name: Not on file  . Number of children: Not on file  . Years of education: Not on file  . Highest education level: Not on file  Social Needs  . Financial resource strain: Not on file  . Food insecurity - worry: Not on file  . Food insecurity - inability: Not on file  . Transportation needs - medical: Not on file  . Transportation needs - non-medical: Not on file  Occupational History  . Not on file  Tobacco Use  . Smoking status: Never Smoker  . Smokeless tobacco: Never Used  Substance and Sexual Activity  . Alcohol use: Yes    Alcohol/week: 1.8 oz    Types: 3 Glasses of wine per week    Comment: Occassional white wine  . Drug use: No  . Sexual activity: Not Currently  Other Topics Concern  . Not on file  Social History Narrative   Divorced fall 2015, lives alone. adopted son nearby. Retired from public health-nutritionist    Family History  Problem Relation Age of Onset  . Pancreatic cancer Father   . Heart disease Brother   . Colon cancer Paternal Grandfather   . Colon cancer Paternal Aunt   . Inflammatory bowel disease Cousin     Review of Systems     Objective:  There were no vitals filed for this visit. Wt Readings from Last 3 Encounters:  05/31/17 120 lb (54.4 kg)  05/27/17 120 lb (54.4 kg)  02/23/17 131 lb (59.4 kg)   There is no height or weight on file to calculate BMI.   Physical Exam    Constitutional: Appears well-developed and well-nourished. No distress.  HENT:  Head: Normocephalic and atraumatic.  Neck: Neck supple. No tracheal deviation present. No thyromegaly present.  No cervical lymphadenopathy Cardiovascular: Normal rate, regular rhythm and normal heart sounds.   No murmur heard. No carotid bruit .  No edema Pulmonary/Chest: Effort normal and breath sounds normal. No  respiratory distress. No has no wheezes. No rales.  Skin: Skin is warm and dry. Not diaphoretic.  Psychiatric: Normal mood and affect. Behavior is normal.      Assessment & Plan:    See Problem List for Assessment and Plan of chronic medical problems.

## 2017-06-30 ENCOUNTER — Ambulatory Visit: Payer: Medicare Other | Admitting: Internal Medicine

## 2017-07-03 DIAGNOSIS — E538 Deficiency of other specified B group vitamins: Secondary | ICD-10-CM | POA: Diagnosis not present

## 2017-07-03 DIAGNOSIS — F0390 Unspecified dementia without behavioral disturbance: Secondary | ICD-10-CM | POA: Diagnosis not present

## 2017-07-03 DIAGNOSIS — I10 Essential (primary) hypertension: Secondary | ICD-10-CM | POA: Diagnosis not present

## 2017-07-03 DIAGNOSIS — M353 Polymyalgia rheumatica: Secondary | ICD-10-CM | POA: Diagnosis not present

## 2017-07-03 DIAGNOSIS — F419 Anxiety disorder, unspecified: Secondary | ICD-10-CM | POA: Diagnosis not present

## 2017-07-03 DIAGNOSIS — M170 Bilateral primary osteoarthritis of knee: Secondary | ICD-10-CM | POA: Diagnosis not present

## 2017-07-03 DIAGNOSIS — F329 Major depressive disorder, single episode, unspecified: Secondary | ICD-10-CM | POA: Diagnosis not present

## 2017-07-03 DIAGNOSIS — M810 Age-related osteoporosis without current pathological fracture: Secondary | ICD-10-CM | POA: Diagnosis not present

## 2017-07-14 ENCOUNTER — Telehealth: Payer: Self-pay | Admitting: Emergency Medicine

## 2017-07-14 NOTE — Telephone Encounter (Signed)
PA completed for Bystolic. KEY- [id number], awaiting response.

## 2017-07-14 NOTE — Telephone Encounter (Signed)
Copied from [place] [id number]. Topic: General - Other >> Jul 14, 2017  1:50 PM Ether Griffins B wrote: Reason for CRM: [employer] called to let office know Bystolic has been approved for one year

## 2017-07-14 NOTE — Telephone Encounter (Signed)
Spoke with pharmacy to advise.

## 2017-07-26 ENCOUNTER — Other Ambulatory Visit: Payer: Self-pay | Admitting: Internal Medicine

## 2017-07-26 ENCOUNTER — Other Ambulatory Visit: Payer: Self-pay | Admitting: *Deleted

## 2017-07-26 MED ORDER — MEMANTINE HCL 10 MG PO TABS: 10.0000 mg | ORAL_TABLET | Freq: Two times a day (BID) | ORAL | 0 refills | Status: AC

## 2017-07-26 MED ORDER — LOSARTAN POTASSIUM 100 MG PO TABS: 100.0000 mg | ORAL_TABLET | Freq: Every day | ORAL | 3 refills | Status: AC

## 2017-07-26 MED ORDER — PRAVASTATIN SODIUM 20 MG PO TABS: 20.0000 mg | ORAL_TABLET | Freq: Every day | ORAL | 3 refills | Status: AC

## 2017-07-26 NOTE — Telephone Encounter (Signed)
Pravachol and Cozaar refilled per protocol. Namenda not prescribed by PCP- request sent for review.

## 2017-07-26 NOTE — Telephone Encounter (Signed)
Copied from [place] [id number]. Topic: Quick Communication - Rx Refill/Question >> Jul 26, 2017 12:32 PM Waylan Rocher, Lumin L wrote: Medication: pravastatin (PRAVACHOL) 20 MG tablet, memantine (NAMENDA) 10 MG tablet,  losartan (COZAAR) 100 MG tablet Has the patient contacted their pharmacy? Yes.   (Agent: If no, request that the patient contact the pharmacy for the refill.) Preferred Pharmacy (with phone number or street name): [facility] [place], [place] - [address] AT [address] Agent: Please be advised that RX refills may take up to 3 business days. We ask that you follow-up with your pharmacy.

## 2017-07-26 NOTE — Telephone Encounter (Signed)
Will send to pharmacy.  Should f/u with neurology.

## 2017-07-27 NOTE — Telephone Encounter (Signed)
Notified pt/spouse  w/MD response.[name] Fox

## 2017-08-09 ENCOUNTER — Encounter: Payer: Self-pay | Admitting: Internal Medicine

## 2017-08-09 ENCOUNTER — Ambulatory Visit: Payer: Medicare Other | Admitting: Internal Medicine

## 2017-08-09 VITALS — BP 130/82 | HR 90 | Temp 97.7°F | Resp 16 | Wt 114.0 lb

## 2017-08-09 DIAGNOSIS — F411 Generalized anxiety disorder: Secondary | ICD-10-CM

## 2017-08-09 DIAGNOSIS — I10 Essential (primary) hypertension: Secondary | ICD-10-CM | POA: Diagnosis not present

## 2017-08-09 DIAGNOSIS — Z23 Encounter for immunization: Secondary | ICD-10-CM

## 2017-08-09 DIAGNOSIS — R634 Abnormal weight loss: Secondary | ICD-10-CM

## 2017-08-09 DIAGNOSIS — F32A Depression, unspecified: Secondary | ICD-10-CM

## 2017-08-09 DIAGNOSIS — F329 Major depressive disorder, single episode, unspecified: Secondary | ICD-10-CM

## 2017-08-09 DIAGNOSIS — E538 Deficiency of other specified B group vitamins: Secondary | ICD-10-CM

## 2017-08-09 DIAGNOSIS — F0390 Unspecified dementia without behavioral disturbance: Secondary | ICD-10-CM | POA: Diagnosis not present

## 2017-08-09 MED ORDER — VITAMIN B-12 1000 MCG PO TABS: 1000.0000 ug | ORAL_TABLET | Freq: Every day | ORAL | Status: AC

## 2017-08-09 NOTE — Assessment & Plan Note (Signed)
Controlled Continue current dose of sertraline

## 2017-08-09 NOTE — Assessment & Plan Note (Addendum)
She has lost weight in the past 3 months and she was here Concerned that she is not eating well or enough.  She is preparing her own food Monitor closely  Avoid/limit alcohol

## 2017-08-09 NOTE — Assessment & Plan Note (Signed)
BP well controlled Current regimen effective and well tolerated Continue current medications at current doses  

## 2017-08-09 NOTE — Assessment & Plan Note (Addendum)
Has not seen neurology in a while-advise follow-up Continue Aricept and Namenda Currently lives with her son, but she is responsible for her own care-often not taking medication Discussed with her brother she needs more assistance with her health care She no longer drives Stressed the importance of limiting alcohol use completely and if she does still drink it should only be on a rare occasion

## 2017-08-09 NOTE — Progress Notes (Signed)
Subjective:    Patient ID: [name] Fox, female    DOB: [birth year], 77 y.o.   MRN: [id number]  HPI The patient is here for follow up.  Her brother is here with her today - he lives in [place].   She lives with her son.  Takes her medications on her own.  Her brother states she forgets to take them.  Her appetite is low.  She prepares the food herself.  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  She is drinking alcohol daily. She denies that she drinks daily and is vague about how much she drinks.  She is sedentary.   Depression, anxiety: Her last visit in November we increased her sertraline because her depression was not controlled to 150 mg daily.  She thinks she is taking this on a daily basis.  She denies any side effects.  She denies any depression today.   Her anxiety is controlled.    Hypertension: Her blood pressure was elevated at her last visit as well and we increased the Bystolic to 10 mg daily.  She states she takes her medication, but admits she may forget on occasion.    Dementia: She does follow with neurology and at her last visit this cost that she needed a follow-up appointment.  Her son was going to call for an appointment, but no appointment is scheduled and she does not remember.  She is taking Aricept and Namenda.  B12 deficiency: She is unsure if she is taking B12 supplementation.    Medications and allergies reviewed with patient and updated if appropriate.  Patient Active Problem List   Diagnosis Date Noted  . Rash and nonspecific skin eruption 02/23/2017  . Chest tightness 05/04/2015  . Numbness and tingling in right hand   . Hypertension 11/15/2013  . Weight loss, unintentional 04/12/2013  . Depression   . PMR (polymyalgia rheumatica) (HCC) 10/31/2011  . Vitamin B12 deficiency 08/31/2010  . Anxiety state 08/31/2010  . Dementia 08/31/2010  . Dyslipidemia 12/08/2009  . KELOID 11/19/2009  . HEARING LOSS, BILATERAL 08/20/2009  .  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 BRONCHIECTASIS WITHOUT ACUTE EXACERBATION 10/18/2007  . HERPES ZOSTER 09/10/2007  . ALLERGIC RHINITIS 04/06/2007  . FIBROCYSTIC BREAST DISEASE 04/06/2007  . Osteoporosis     Current Outpatient Medications on File Prior to Visit  Medication Sig Dispense Refill  . Calcium Carbonate (CALCIUM 600 PO) Take 1,200 mg by mouth daily.    Marland Kitchen donepezil (ARICEPT) 10 MG tablet Take 1 tablet (10 mg total) at bedtime by mouth. 90 tablet 1  . loratadine (CLARITIN) 10 MG tablet Take 10 mg by mouth daily.    Marland Kitchen losartan (COZAAR) 100 MG tablet Take 1 tablet (100 mg total) by mouth daily. 90 tablet 3  . memantine (NAMENDA) 10 MG tablet Take 1 tablet (10 mg total) by mouth 2 (two) times daily. 180 tablet 0  . nebivolol (BYSTOLIC) 10 MG tablet Take 1 tablet (10 mg total) daily by mouth. 30 tablet 5  . Omega-3 Fatty Acids (FISH OIL) 1000 MG CAPS Take 1,000 mg by mouth daily.      . pravastatin (PRAVACHOL) 20 MG tablet Take 1 tablet (20 mg total) by mouth daily. 90 tablet 3  . sertraline (ZOLOFT) 100 MG tablet Take 1.5 tablets (150 mg total) daily by mouth. 135 tablet 1  . triamcinolone cream (KENALOG) 0.1 % Compound 1:1 with Eucerin cream and apply bid as needed to affected  rash 453 g 1  . Vitamin D, Cholecalciferol, 1000 units TABS Take 1,000 Units by mouth daily.     No current facility-administered medications on file prior to visit.     Past Medical History:  Diagnosis Date  . Adenomatous colon polyp 05/26/1999   procedure at [facility] [place] in [place], [place]   . ANXIETY   . DEGENERATIVE JOINT DISEASE, KNEES, BILATERAL   . Dementia 08/31/2010   neuropsyc eval 10/2012 and follows with neuro  . Diverticulosis   . DYSLIPIDEMIA   . Fibrocystic breast disease   . HEARING LOSS, BILATERAL   . Hemorrhoids   . HERPES ZOSTER   . OSTEOPENIA   . Osteoporosis    DEXA A LB 09/22/14: -2.4 R fem, progressive decline since 2011      . PMR (polymyalgia rheumatica) (HCC) dx 10/2011   Initially dx GCA, then PMR clarified -on pred, follows with rheum  . VITAMIN B12 DEFICIENCY     Past Surgical History:  Procedure Laterality Date  . Bilateral Mastectomy     while removing cosmetic silicone implants (NO Breast cancer)  . Bilateral silicone Breast explant  1993   Due to rupture/leak @ [place] History   Socioeconomic History  . Marital status: Married    Spouse name: None  . Number of children: None  . Years of education: None  . Highest education level: None  Social Needs  . Financial resource strain: None  . Food insecurity - worry: None  . Food insecurity - inability: None  . Transportation needs - medical: None  . Transportation needs - non-medical: None  Occupational History  . None  Tobacco Use  . Smoking status: Never Smoker  . Smokeless tobacco: Never Used  Substance and Sexual Activity  . Alcohol use: Yes    Alcohol/week: 1.8 oz    Types: 3 Glasses of wine per week    Comment: Occassional white wine  . Drug use: No  . Sexual activity: Not Currently  Other Topics Concern  . None  Social History Narrative   Divorced fall 2015, lives alone. adopted son nearby. Retired from public health-nutritionist    Family History  Problem Relation Age of Onset  . Pancreatic cancer Father   . Heart disease Brother   . Colon cancer Paternal Grandfather   . Colon cancer Paternal Aunt   . Inflammatory bowel disease Cousin     Review of Systems  Constitutional: Negative for chills and fever.  Respiratory: Negative for cough, shortness of breath and wheezing.   Cardiovascular: Negative for chest pain, palpitations and leg swelling.  Neurological: Negative for light-headedness and headaches.  Psychiatric/Behavioral: Positive for dysphoric mood (controlled). Negative for sleep disturbance. The patient is nervous/anxious (controlled).        Objective:   Vitals:   08/09/17 1340  BP:  130/82  Pulse: 90  Resp: 16  Temp: 97.7 F (36.5 C)  SpO2: 96%   Wt Readings from Last 3 Encounters:  08/09/17 114 lb (51.7 kg)  05/31/17 120 lb (54.4 kg)  05/27/17 120 lb (54.4 kg)   Body mass index is 17.85 kg/m.   Physical Exam    Constitutional: Appears well-developed and well-nourished. No distress.  HENT:  Head: Normocephalic and atraumatic.  Neck: Neck supple. No tracheal deviation present. No thyromegaly present.  No cervical lymphadenopathy Cardiovascular: Normal rate, regular rhythm and normal heart sounds.   No murmur heard. No carotid bruit .  No  edema Pulmonary/Chest: Effort normal and breath sounds normal. No respiratory distress. No has no wheezes. No rales.  Skin: Skin is warm and dry. Not diaphoretic.  Psychiatric: Normal mood and affect. Behavior is normal.      Assessment & Plan:    See Problem List for Assessment and Plan of chronic medical problems.

## 2017-08-09 NOTE — Assessment & Plan Note (Signed)
?    Taking vitamin B12 Advised 1000 mcg daily

## 2017-08-09 NOTE — Patient Instructions (Addendum)
Follow up with neurology:  [facility] - Dr Krista Blue Address: [address], [place], [place] [address]  Phone  [phone]     Flu immunization administered today.   Medications reviewed and updated.  No changes recommended at this time.   Please followup in 4 months

## 2017-08-16 ENCOUNTER — Telehealth: Payer: Self-pay | Admitting: *Deleted

## 2017-08-16 NOTE — Telephone Encounter (Signed)
Called patient's brother [name] Fox to discuss resource options to assist the patient in her home. Brother shared that at this time the patient is refusing to allow any health care professionals to come to her home. Mr. [name] Fox explained that the patient's son is not in the home on a daily basis and may be away for days. Mr. [name] Fox does not feel that the patient is taking her medication correctly and states that she is not getting adequate nutrition.  [name] Fox does have 2 different neighbors who come to check on her several times weekly, he adds that she takes a taxi to the grocery store, and that she does at this time prepare her meals. Mr. [name] Fox explained that a nurse from the patient's church has been discussing with the brother and [name] Fox options for different assisted living facilities but the patient has not stated she would be willing to make this move. Nurse did discuss options for home health agencies and it was contracted that this information would be sent to Mr. [name] Fox via email for future use. Nurse did give her contact information to Mr. [name] Fox and requested that he call if he felt that the nurse or Dr. Quay Fox could help in the future. Mr. [name] Fox was appreciative for the outreach phone call and stated he would contact the nurse for further assistance if needed.

## 2017-08-16 NOTE — Telephone Encounter (Signed)
Sharee Pimple, thank you so much.  That was very helpful.  I appreciate you reaching out to him!

## 2017-09-05 ENCOUNTER — Ambulatory Visit: Payer: Self-pay

## 2017-09-05 ENCOUNTER — Encounter (HOSPITAL_COMMUNITY): Payer: Self-pay | Admitting: Family Medicine

## 2017-09-05 ENCOUNTER — Ambulatory Visit (HOSPITAL_COMMUNITY)
Admission: EM | Admit: 2017-09-05 | Discharge: 2017-09-05 | Disposition: A | Discharge status: Home / Self Care | Payer: Medicare Other | Attending: Family Medicine | Admitting: Family Medicine

## 2017-09-05 DIAGNOSIS — S81811A Laceration without foreign body, right lower leg, initial encounter: Secondary | ICD-10-CM

## 2017-09-05 NOTE — Discharge Instructions (Signed)
Return in 7 days for suture removal. Change dressing daily May bathe beginning Thursday.

## 2017-09-05 NOTE — Telephone Encounter (Signed)
Patient's neighbor [name] called to say "the patient fell last night against a porcelain flower pot, as best as I can tell, and she cut her right buttock. It is about 5" in length and about 1" deep, open with what looks like fatty tissue. I live about 2 houses up and check on her everyday along with other neighbors. She had blood on her pants and a lot of bloody paper towels were evident that it bled right much, but it's not bleeding now. I was wondering should I take her to the emergency room or come in to the office." He says she has dementia. He put me on speaker phone when he got to her house, she verified her identity. I asked her what happened, she said "I don't know, what happened," then Mali said "you fell." She said "ok." I asked her what hurts her, she said "my butt." Mali started talking to me and she was quiet.  I asked who would take her to the office, he said "her son [name] Fox who lives with her, I guess. If not, then I will. Let me call him to find out." [name] Fox was called and placed on speaker. Mali informed him that an appointment was being made to take the patient to the doctor tomorrow, what is a good time, he said "anytime, first morning time is fine. I will get her there." Appointment made for tomorrow at 9:20 with [name] Mourning, NP, care advice given, Mali verbalized understanding.   Reason for Disposition . [1] Last tetanus shot > 10 years ago AND [2] CLEAN cut  Answer Assessment - Initial Assessment Questions 1. APPEARANCE of INJURY: "What does the injury look like?"      Open 1" fatty tissue  2. SIZE: "How large is the cut?"      5"  3. BLEEDING: "Is it bleeding now?" If so, ask: "Is it difficult to stop?"      No 4. LOCATION: "Where is the injury located?"      Right Buttock 5. ONSET: "How long ago did the injury occur?"      Last night 6. MECHANISM: "Tell me how it happened."     Fell against porcelain flower pot 7. TETANUS: "When was the last tetanus booster?"  Unknown 8. PREGNANCY: "Is there any chance you are pregnant?" "When was your last menstrual period?"     N/A  Protocols used: [place]

## 2017-09-05 NOTE — ED Triage Notes (Signed)
Pt here for cut to the right buttocks  after falling on a broken pot earlier this evening.

## 2017-09-05 NOTE — ED Provider Notes (Signed)
[place]   [id number] 09/05/17 Arrival Time: 1816   SUBJECTIVE:  [name] Fox is a 77 y.o. female who presents to the urgent care with complaint of cut to the right buttocks  after falling on a broken pot earlier this evening.   Last dT March 2016  Past Medical History:  Diagnosis Date  . Adenomatous colon polyp 05/26/1999   procedure at [facility] in [place], [place]   . ANXIETY   . DEGENERATIVE JOINT DISEASE, KNEES, BILATERAL   . Dementia 08/31/2010   neuropsyc eval 10/2012 and follows with neuro  . Diverticulosis   . DYSLIPIDEMIA   . Fibrocystic breast disease   . HEARING LOSS, BILATERAL   . Hemorrhoids   . HERPES ZOSTER   . OSTEOPENIA   . Osteoporosis    DEXA A LB 09/22/14: -2.4 R fem, progressive decline since 2011    . PMR (polymyalgia rheumatica) (HCC) dx 10/2011   Initially dx GCA, then PMR clarified -on pred, follows with rheum  . VITAMIN B12 DEFICIENCY    Family History  Problem Relation Age of Onset  . Pancreatic cancer Father   . Heart disease Brother   . Colon cancer Paternal Grandfather   . Colon cancer Paternal Aunt   . Inflammatory bowel disease Cousin    Social History   Socioeconomic History  . Marital status: Married    Spouse name: Not on file  . Number of children: Not on file  . Years of education: Not on file  . Highest education level: Not on file  Social Needs  . Financial resource strain: Not on file  . Food insecurity - worry: Not on file  . Food insecurity - inability: Not on file  . Transportation needs - medical: Not on file  . Transportation needs - non-medical: Not on file  Occupational History  . Not on file  Tobacco Use  . Smoking status: Never Smoker  . Smokeless tobacco: Never Used  Substance and Sexual Activity  . Alcohol use: Yes    Alcohol/week: 1.8 oz    Types: 3 Glasses of wine per week    Comment: Occassional white wine  . Drug use: No  . Sexual activity: Not Currently    Other Topics Concern  . Not on file  Social History Narrative   Divorced fall 2015, lives alone. adopted son nearby. Retired from public health-nutritionist   No outpatient medications have been marked as taking for the 09/05/17 encounter [facility] Encounter).   Allergies  Allergen Reactions  . Penicillins Shortness Of Breath, Swelling and Rash  . Amlodipine Rash  . Sulfasalazine Rash      ROS: As per HPI, remainder of ROS negative.   OBJECTIVE:   Vitals:   09/05/17 1903  BP: (!) 123/57  Pulse: 80  Resp: 18  Temp: 98.3 F (36.8 C)  SpO2: 96%     General appearance: alert; no distress Eyes: PERRL; EOMI; conjunctiva normal HENT: normocephalic; atraumatic; TMs normal, canal normal, external ears normal without trauma; nasal mucosa normal; oral mucosa normal Neck: supple Skin: warm and dry Neurologic: normal gait; grossly normal Psychological: alert and cooperative; forgetful      Labs:  Results for orders placed or performed in visit on 05/31/17  Vitamin B12  Result Value Ref Range   Vitamin B-12 233 211 - 911 pg/mL  TSH  Result Value Ref Range   TSH 3.11 0.35 - 4.50 uIU/mL  Comprehensive metabolic panel  Result Value  Ref Range   Sodium 134 (L) 135 - 145 mEq/L   Potassium 3.6 3.5 - 5.1 mEq/L   Chloride 95 (L) 96 - 112 mEq/L   CO2 28 19 - 32 mEq/L   Glucose, Bld 86 70 - 99 mg/dL   BUN 9 6 - 23 mg/dL   Creatinine, Ser 0.84 0.40 - 1.20 mg/dL   Total Bilirubin 0.9 0.2 - 1.2 mg/dL   Alkaline Phosphatase 50 39 - 117 U/L   AST 66 (H) 0 - 37 U/L   ALT 42 (H) 0 - 35 U/L   Total Protein 6.9 6.0 - 8.3 g/dL   Albumin 4.0 3.5 - 5.2 g/dL   Calcium 9.2 8.4 - 10.5 mg/dL   GFR 70.32 >60.00 mL/min  CBC with Differential/Platelet  Result Value Ref Range   WBC 5.4 4.0 - 10.5 K/uL   RBC 3.79 (L) 3.87 - 5.11 Mil/uL   Hemoglobin 13.4 12.0 - 15.0 g/dL   HCT 39.7 36.0 - 46.0 %   MCV 104.7 (H) 78.0 - 100.0 fl   MCHC 33.6 30.0 - 36.0 g/dL   RDW 15.6 (H) 11.5 - 15.5 %    Platelets 242.0 150.0 - 400.0 K/uL   Neutrophils Relative % 73.8 43.0 - 77.0 %   Lymphocytes Relative 15.2 12.0 - 46.0 %   Monocytes Relative 8.7 3.0 - 12.0 %   Eosinophils Relative 1.3 0.0 - 5.0 %   Basophils Relative 1.0 0.0 - 3.0 %   Neutro Abs 4.0 1.4 - 7.7 K/uL   Lymphs Abs 0.8 0.7 - 4.0 K/uL   Monocytes Absolute 0.5 0.1 - 1.0 K/uL   Eosinophils Absolute 0.1 0.0 - 0.7 K/uL   Basophils Absolute 0.1 0.0 - 0.1 K/uL  Lipid panel  Result Value Ref Range   Cholesterol 233 (H) 0 - 200 mg/dL   Triglycerides 57.0 0.0 - 149.0 mg/dL   HDL 105.80 >39.00 mg/dL   VLDL 11.4 0.0 - 40.0 mg/dL   LDL Cholesterol 116 (H) 0 - 99 mg/dL   Total CHOL/HDL Ratio 2    NonHDL 127.50   Hemoglobin A1c  Result Value Ref Range   Hgb A1c MFr Bld 5.8 4.6 - 6.5 %    Labs Reviewed - No data to display  No results found.     ASSESSMENT & PLAN:  1. Laceration of right lower extremity, initial encounter     No orders of the defined types were placed in this encounter.   Reviewed expectations re: course of current medical issues. Questions answered. Outlined signs and symptoms indicating need for more acute intervention. Patient verbalized understanding. After Visit Summary given.    Procedures: 10 " long right gluteal laceration 3 cm deep repaired with 2 x running 3-0 ethilon sutures after sterile prep with betadine and 2%xylo with epi (8cc) local anesth.  Tolerated well.  No significant blood loss.     Robyn Haber, MD 09/05/17 [place]

## 2017-09-05 NOTE — ED Notes (Signed)
Wound dressed with telfa

## 2017-09-06 ENCOUNTER — Ambulatory Visit: Payer: Medicare Other | Admitting: Family

## 2017-09-06 ENCOUNTER — Telehealth: Payer: Self-pay | Admitting: Internal Medicine

## 2017-09-06 NOTE — Telephone Encounter (Signed)
Called her brother Quita Skye in [place] at [phone].  Per Quita Skye she is drinking more - she fell three times in the past 2 weeks.  He is talking to her neighbors who has given him the information.  She did need to go to the emergency room last night after cutting herself when she fell.  Stitches were placed.  Her son was not home at the time apparently, but he did come home from work and take her to the emergency room.  Her brother is concerned about her welfare.  He is concerned about her being home alone so much and not being safe at home by herself.  She has been known to cook and leave things in the [place] without realizing it.  She has had multiple falls in the last couple of weeks.  He wondered about calling DSS.    Advised calling DSS for further evaluation from them.  Her son does have power of attorney.  It is my opinion that Vaughan Basta does not have the capacity to make her own medical decisions.  She has not been evaluated by a psychologist or psychiatrist, but this would be a good idea in the near future.

## 2017-09-06 NOTE — Telephone Encounter (Signed)
Copied from [place] [id number]. Topic: Quick Communication - See Telephone Encounter >> Sep 06, 2017 11:38 AM Ether Griffins B wrote: CRM for notification. See Telephone encounter for:  Pt's brother calling in wanting to speak with Dr. Quay Burow regarding pt. Pt has fallen 3 times in the past 2 weeks and was brought to [place] ER 09/05/17. Pt has a son who lives with her but is not very effective in taking care of her and he would like to discuss some options for Mrs. Eland. He is wondering if he should be calling social services to step in and do a check. Her son is power of attorney so he is wondering what would be the best route to go.  09/06/17.

## 2017-09-07 ENCOUNTER — Telehealth: Payer: Self-pay | Admitting: Internal Medicine

## 2017-09-07 NOTE — Telephone Encounter (Signed)
LVM for pt to call back and discuss and make appt to have stitches removed.

## 2017-09-07 NOTE — Telephone Encounter (Signed)
She was in the emergency room 2/19 for a laceration that was repaired with stitches.  She does have dementia.  She lives with her son, but he is often not at home because he works.  Please call her and make sure the wound is doing well.  Make sure someone is helping her with wound care.  Remind her she needs to have the stitches taken out and I am not sure if she has had any arrangements for this.

## 2017-09-12 ENCOUNTER — Ambulatory Visit (HOSPITAL_COMMUNITY)
Admission: EM | Admit: 2017-09-12 | Discharge: 2017-09-12 | Disposition: A | Discharge status: Home / Self Care | Payer: Medicare Other

## 2017-09-12 DIAGNOSIS — Z4802 Encounter for removal of sutures: Secondary | ICD-10-CM

## 2017-09-12 DIAGNOSIS — S81811D Laceration without foreign body, right lower leg, subsequent encounter: Secondary | ICD-10-CM

## 2017-09-12 NOTE — Telephone Encounter (Signed)
Pt was seen at [facility] today.

## 2017-09-12 NOTE — ED Triage Notes (Signed)
PT had running sutures placed to right buttock 1 week ago. Wound edges well approximated. No signs of infection. Sutures removed and steri strips placed per [place], [place] reccomendation

## 2017-09-12 NOTE — ED Notes (Signed)
PT denies VS recheck

## 2017-12-05 NOTE — Progress Notes (Signed)
Subjective:    Patient ID: [name] Fox, female    DOB: 05/10/1941, 77 y.o.   MRN: [id number]  HPI The patient is here for follow up.  She is here alone and due to her dementia she is most likely not a reliable historian.    Dementia: she states she is taking her medication daily.    Hypertension: She is taking her medication daily. She is compliant with a low sodium diet.  She denies chest pain, palpitations, edema, shortness of breath and regular headaches. She is exercising regularly - walks.  She does not monitor her blood pressure at home.    Hyperlipidemia: She is taking her medication daily. She is compliant with a low fat/cholesterol diet. She is exercising regularly. She denies myalgias.   Depression, Anxiety: She is taking her medication daily as prescribed. She denies any side effects from the medication. She feels her anxiety and depression are well controlled.   Alcohol abuse:  She denies regular alcohol use.    Weight loss:  Her weight loss is down today.  She states she is eating.  She has noticed her clothes have gotten worse.   OP:  She is taking calcium/ vitamin sometimes.  She is walking in the neighborhood.    Medications and allergies reviewed with patient and updated if appropriate.  Patient Active Problem List   Diagnosis Date Noted  . Rash and nonspecific skin eruption 02/23/2017  . Chest tightness 05/04/2015  . Numbness and tingling in right hand   . Hypertension 11/15/2013  . Weight loss, unintentional 04/12/2013  . Depression   . PMR (polymyalgia rheumatica) (HCC) 10/31/2011  . Vitamin B12 deficiency 08/31/2010  . Anxiety state 08/31/2010  . Dementia 08/31/2010  . Dyslipidemia 12/08/2009  . KELOID 11/19/2009  . HEARING LOSS, BILATERAL 08/20/2009  . 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 BRONCHIECTASIS WITHOUT ACUTE EXACERBATION 10/18/2007  . HERPES ZOSTER 09/10/2007  . ALLERGIC RHINITIS 04/06/2007  . FIBROCYSTIC BREAST  DISEASE 04/06/2007  . Osteoporosis     Current Outpatient Medications on File Prior to Visit  Medication Sig Dispense Refill  . Calcium Carbonate (CALCIUM 600 PO) Take 1,200 mg by mouth daily.    Marland Kitchen donepezil (ARICEPT) 10 MG tablet Take 1 tablet (10 mg total) at bedtime by mouth. 90 tablet 1  . loratadine (CLARITIN) 10 MG tablet Take 10 mg by mouth daily.    Marland Kitchen losartan (COZAAR) 100 MG tablet Take 1 tablet (100 mg total) by mouth daily. 90 tablet 3  . memantine (NAMENDA) 10 MG tablet Take 1 tablet (10 mg total) by mouth 2 (two) times daily. 180 tablet 0  . nebivolol (BYSTOLIC) 10 MG tablet Take 1 tablet (10 mg total) daily by mouth. 30 tablet 5  . pravastatin (PRAVACHOL) 20 MG tablet Take 1 tablet (20 mg total) by mouth daily. 90 tablet 3  . sertraline (ZOLOFT) 100 MG tablet Take 1.5 tablets (150 mg total) daily by mouth. 135 tablet 1  . triamcinolone cream (KENALOG) 0.1 % Compound 1:1 with Eucerin cream and apply bid as needed to affected rash 453 g 1  . vitamin B-12 (CYANOCOBALAMIN) 1000 MCG tablet Take 1 tablet (1,000 mcg total) by mouth daily.    . Vitamin D, Cholecalciferol, 1000 units TABS Take 1,000 Units by mouth daily.     No current facility-administered medications on file prior to visit.     Past Medical History:  Diagnosis Date  . Adenomatous colon polyp 05/26/1999   procedure  at [facility] in [place], [place]   . ANXIETY   . DEGENERATIVE JOINT DISEASE, KNEES, BILATERAL   . Dementia 08/31/2010   neuropsyc eval 10/2012 and follows with neuro  . Diverticulosis   . DYSLIPIDEMIA   . Fibrocystic breast disease   . HEARING LOSS, BILATERAL   . Hemorrhoids   . HERPES ZOSTER   . OSTEOPENIA   . Osteoporosis    DEXA A LB 09/22/14: -2.4 R fem, progressive decline since 2011    . PMR (polymyalgia rheumatica) (HCC) dx 10/2011   Initially dx GCA, then PMR clarified -on pred, follows with rheum  . VITAMIN B12 DEFICIENCY     Past Surgical History:    Procedure Laterality Date  . Bilateral Mastectomy     while removing cosmetic silicone implants (NO Breast cancer)  . Bilateral silicone Breast explant  1993   Due to rupture/leak @ [facility] History   Socioeconomic History  . Marital status: Married    Spouse name: Not on file  . Number of children: Not on file  . Years of education: Not on file  . Highest education level: Not on file  Occupational History  . Not on file  Social Needs  . Financial resource strain: Not on file  . Food insecurity:    Worry: Not on file    Inability: Not on file  . Transportation needs:    Medical: Not on file    Non-medical: Not on file  Tobacco Use  . Smoking status: Never Smoker  . Smokeless tobacco: Never Used  Substance and Sexual Activity  . Alcohol use: Yes    Alcohol/week: 1.8 oz    Types: 3 Glasses of wine per week    Comment: Occassional white wine  . Drug use: No  . Sexual activity: Not Currently  Lifestyle  . Physical activity:    Days per week: Not on file    Minutes per session: Not on file  . Stress: Not on file  Relationships  . Social connections:    Talks on phone: Not on file    Gets together: Not on file    Attends religious service: Not on file    Active member of club or organization: Not on file    Attends meetings of clubs or organizations: Not on file    Relationship status: Not on file  Other Topics Concern  . Not on file  Social History Narrative   Divorced fall 2015, lives alone. adopted son nearby. Retired from public health-nutritionist    Family History  Problem Relation Age of Onset  . Pancreatic cancer Father   . Heart disease Brother   . Colon cancer Paternal Grandfather   . Colon cancer Paternal Aunt   . Inflammatory bowel disease Cousin     Review of Systems  Constitutional: Negative for appetite change, chills and fever.  Respiratory: Negative for cough, shortness of breath and wheezing.   Cardiovascular: Negative for  chest pain, palpitations and leg swelling.  Gastrointestinal: Negative for abdominal pain and nausea.       No gerd  Neurological: Negative for light-headedness and headaches.  Psychiatric/Behavioral: Negative for dysphoric mood and sleep disturbance. The patient is not nervous/anxious.        Objective:   Vitals:   12/06/17 1306  BP: (!) 156/82  Pulse: 83  Resp: 16  SpO2: 95%   BP Readings from Last 3 Encounters:  12/06/17 (!) 156/82  09/05/17 (!) 123/57  08/09/17 130/82   Wt Readings from Last 3 Encounters:  12/06/17 106 lb (48.1 kg)  08/09/17 114 lb (51.7 kg)  05/31/17 120 lb (54.4 kg)   Body mass index is 16.6 kg/m.   Physical Exam    Constitutional: Appears well-developed and well-nourished. No distress.  HENT:  Head: Normocephalic and atraumatic.  Neck: Neck supple. No tracheal deviation present. No thyromegaly present.  No cervical lymphadenopathy Cardiovascular: Normal rate, regular rhythm and normal heart sounds.   1/6 systolic murmur heard. No carotid bruit .  No edema Pulmonary/Chest: Effort normal and breath sounds normal. No respiratory distress. No has no wheezes. No rales.  Skin: Skin is warm and dry. Not diaphoretic.  Psychiatric: Normal mood and affect. Behavior is normal.      Assessment & Plan:    See Problem List for Assessment and Plan of chronic medical problems.

## 2017-12-06 ENCOUNTER — Ambulatory Visit: Payer: Medicare Other | Admitting: Internal Medicine

## 2017-12-06 ENCOUNTER — Encounter: Payer: Self-pay | Admitting: Internal Medicine

## 2017-12-06 VITALS — BP 156/82 | HR 83 | Resp 16 | Wt 106.0 lb

## 2017-12-06 DIAGNOSIS — F0390 Unspecified dementia without behavioral disturbance: Secondary | ICD-10-CM | POA: Diagnosis not present

## 2017-12-06 DIAGNOSIS — F329 Major depressive disorder, single episode, unspecified: Secondary | ICD-10-CM | POA: Diagnosis not present

## 2017-12-06 DIAGNOSIS — E538 Deficiency of other specified B group vitamins: Secondary | ICD-10-CM

## 2017-12-06 DIAGNOSIS — F411 Generalized anxiety disorder: Secondary | ICD-10-CM | POA: Diagnosis not present

## 2017-12-06 DIAGNOSIS — F32A Depression, unspecified: Secondary | ICD-10-CM

## 2017-12-06 DIAGNOSIS — M810 Age-related osteoporosis without current pathological fracture: Secondary | ICD-10-CM

## 2017-12-06 DIAGNOSIS — I10 Essential (primary) hypertension: Secondary | ICD-10-CM

## 2017-12-06 DIAGNOSIS — E785 Hyperlipidemia, unspecified: Secondary | ICD-10-CM | POA: Diagnosis not present

## 2017-12-06 DIAGNOSIS — R634 Abnormal weight loss: Secondary | ICD-10-CM

## 2017-12-06 NOTE — Assessment & Plan Note (Signed)
She is here by herself and it is difficult to know if she is truly taking her meds and if there has been any change in her dementia Continue current meds

## 2017-12-06 NOTE — Assessment & Plan Note (Signed)
BP elevated here today, but typically better ? Taking her meds - she states she is No change in medications Monitor bp cmp

## 2017-12-06 NOTE — Assessment & Plan Note (Signed)
Weight is down She has dementia and alcohol abuse - she denies excessive alcohol use, but she is not a reliable historian Advised good nutrition and no alcohol monitor weight

## 2017-12-06 NOTE — Assessment & Plan Note (Signed)
Taking calcium / vitamin but not regularly Walking Would consider fosamax again dexa ordered

## 2017-12-06 NOTE — Assessment & Plan Note (Signed)
Controlled, stable Continue current dose of medication  

## 2017-12-06 NOTE — Patient Instructions (Addendum)
  Test(s) ordered today. Your results will be released to MyChart (or called to you) after review, usually within 72hours after test completion. If any changes need to be made, you will be notified at that same time.  Medications reviewed and updated.  No changes recommended at this time.    Please followup in 6 months   

## 2017-12-06 NOTE — Assessment & Plan Note (Signed)
Check lipid panel  Continue daily statin Regular exercise and healthy diet encouraged  

## 2017-12-06 NOTE — Assessment & Plan Note (Signed)
?   Taking b12 Check level

## 2017-12-07 ENCOUNTER — Inpatient Hospital Stay: Admission: RE | Admit: 2017-12-07 | Payer: Medicare Other | Source: Ambulatory Visit

## 2018-02-15 ENCOUNTER — Encounter: Payer: Self-pay | Admitting: Gastroenterology

## 2018-02-28 ENCOUNTER — Emergency Department (HOSPITAL_COMMUNITY): Payer: Medicare Other

## 2018-02-28 ENCOUNTER — Other Ambulatory Visit: Payer: Self-pay

## 2018-02-28 ENCOUNTER — Observation Stay (HOSPITAL_COMMUNITY)
Admission: EM | Admit: 2018-02-28 | Discharge: 2018-03-05 | Disposition: A | Discharge status: Skilled Nursing Facility | Payer: Medicare Other | Attending: Internal Medicine | Admitting: Internal Medicine

## 2018-02-28 ENCOUNTER — Encounter (HOSPITAL_COMMUNITY): Payer: Self-pay | Admitting: Emergency Medicine

## 2018-02-28 DIAGNOSIS — S2231XA Fracture of one rib, right side, initial encounter for closed fracture: Secondary | ICD-10-CM

## 2018-02-28 DIAGNOSIS — I77810 Thoracic aortic ectasia: Secondary | ICD-10-CM

## 2018-02-28 DIAGNOSIS — I700 Atherosclerosis of aorta: Secondary | ICD-10-CM | POA: Diagnosis not present

## 2018-02-28 DIAGNOSIS — J180 Bronchopneumonia, unspecified organism: Secondary | ICD-10-CM | POA: Diagnosis present

## 2018-02-28 DIAGNOSIS — Z800 Family history of malignant neoplasm of digestive organs: Secondary | ICD-10-CM | POA: Diagnosis not present

## 2018-02-28 DIAGNOSIS — F411 Generalized anxiety disorder: Secondary | ICD-10-CM | POA: Diagnosis not present

## 2018-02-28 DIAGNOSIS — I10 Essential (primary) hypertension: Secondary | ICD-10-CM | POA: Diagnosis not present

## 2018-02-28 DIAGNOSIS — S2241XA Multiple fractures of ribs, right side, initial encounter for closed fracture: Secondary | ICD-10-CM | POA: Insufficient documentation

## 2018-02-28 DIAGNOSIS — W1800XA Striking against unspecified object with subsequent fall, initial encounter: Secondary | ICD-10-CM

## 2018-02-28 DIAGNOSIS — M353 Polymyalgia rheumatica: Secondary | ICD-10-CM | POA: Insufficient documentation

## 2018-02-28 DIAGNOSIS — F10929 Alcohol use, unspecified with intoxication, unspecified: Secondary | ICD-10-CM | POA: Diagnosis present

## 2018-02-28 DIAGNOSIS — Z888 Allergy status to other drugs, medicaments and biological substances status: Secondary | ICD-10-CM | POA: Diagnosis not present

## 2018-02-28 DIAGNOSIS — Z9013 Acquired absence of bilateral breasts and nipples: Secondary | ICD-10-CM | POA: Insufficient documentation

## 2018-02-28 DIAGNOSIS — W01198A Fall on same level from slipping, tripping and stumbling with subsequent striking against other object, initial encounter: Secondary | ICD-10-CM | POA: Diagnosis not present

## 2018-02-28 DIAGNOSIS — Z79899 Other long term (current) drug therapy: Secondary | ICD-10-CM | POA: Diagnosis not present

## 2018-02-28 DIAGNOSIS — I2510 Atherosclerotic heart disease of native coronary artery without angina pectoris: Secondary | ICD-10-CM | POA: Insufficient documentation

## 2018-02-28 DIAGNOSIS — Y908 Blood alcohol level of 240 mg/100 ml or more: Secondary | ICD-10-CM | POA: Insufficient documentation

## 2018-02-28 DIAGNOSIS — Z880 Allergy status to penicillin: Secondary | ICD-10-CM | POA: Insufficient documentation

## 2018-02-28 DIAGNOSIS — F32A Depression, unspecified: Secondary | ICD-10-CM | POA: Diagnosis present

## 2018-02-28 DIAGNOSIS — Z882 Allergy status to sulfonamides status: Secondary | ICD-10-CM | POA: Diagnosis not present

## 2018-02-28 DIAGNOSIS — E538 Deficiency of other specified B group vitamins: Secondary | ICD-10-CM | POA: Diagnosis present

## 2018-02-28 DIAGNOSIS — F329 Major depressive disorder, single episode, unspecified: Secondary | ICD-10-CM | POA: Diagnosis not present

## 2018-02-28 DIAGNOSIS — Z86010 Personal history of colonic polyps: Secondary | ICD-10-CM | POA: Diagnosis not present

## 2018-02-28 DIAGNOSIS — R413 Other amnesia: Secondary | ICD-10-CM | POA: Diagnosis present

## 2018-02-28 DIAGNOSIS — S0101XA Laceration without foreign body of scalp, initial encounter: Secondary | ICD-10-CM | POA: Insufficient documentation

## 2018-02-28 DIAGNOSIS — F0390 Unspecified dementia without behavioral disturbance: Secondary | ICD-10-CM | POA: Diagnosis not present

## 2018-02-28 DIAGNOSIS — F10921 Alcohol use, unspecified with intoxication delirium: Secondary | ICD-10-CM

## 2018-02-28 DIAGNOSIS — H9193 Unspecified hearing loss, bilateral: Secondary | ICD-10-CM | POA: Insufficient documentation

## 2018-02-28 DIAGNOSIS — E871 Hypo-osmolality and hyponatremia: Secondary | ICD-10-CM | POA: Diagnosis not present

## 2018-02-28 DIAGNOSIS — F1010 Alcohol abuse, uncomplicated: Secondary | ICD-10-CM | POA: Diagnosis not present

## 2018-02-28 DIAGNOSIS — S0990XA Unspecified injury of head, initial encounter: Secondary | ICD-10-CM

## 2018-02-28 DIAGNOSIS — E785 Hyperlipidemia, unspecified: Secondary | ICD-10-CM | POA: Diagnosis present

## 2018-02-28 DIAGNOSIS — Z008 Encounter for other general examination: Secondary | ICD-10-CM

## 2018-02-28 DIAGNOSIS — J439 Emphysema, unspecified: Secondary | ICD-10-CM | POA: Diagnosis not present

## 2018-02-28 DIAGNOSIS — F10229 Alcohol dependence with intoxication, unspecified: Principal | ICD-10-CM | POA: Insufficient documentation

## 2018-02-28 DIAGNOSIS — H9190 Unspecified hearing loss, unspecified ear: Secondary | ICD-10-CM

## 2018-02-28 HISTORY — DX: Anemia, unspecified: D64.9

## 2018-02-28 HISTORY — DX: Essential (primary) hypertension: I10

## 2018-02-28 HISTORY — DX: Alcohol abuse, uncomplicated: F10.10

## 2018-02-28 LAB — URINALYSIS, ROUTINE W REFLEX MICROSCOPIC
BILIRUBIN URINE: NEGATIVE
Bacteria, UA: NONE SEEN
Glucose, UA: NEGATIVE mg/dL
Ketones, ur: 5 mg/dL — AB
Leukocytes, UA: NEGATIVE
NITRITE: NEGATIVE
PROTEIN: NEGATIVE mg/dL
SPECIFIC GRAVITY, URINE: 1.005 (ref 1.005–1.030)
pH: 5 (ref 5.0–8.0)

## 2018-02-28 LAB — RAPID URINE DRUG SCREEN, HOSP PERFORMED
Amphetamines: NOT DETECTED
Barbiturates: NOT DETECTED
Benzodiazepines: NOT DETECTED
Cocaine: NOT DETECTED
OPIATES: NOT DETECTED
TETRAHYDROCANNABINOL: NOT DETECTED

## 2018-02-28 LAB — COMPREHENSIVE METABOLIC PANEL
ALBUMIN: 4 g/dL (ref 3.5–5.0)
ALT: 41 U/L (ref 0–44)
AST: 61 U/L — ABNORMAL HIGH (ref 15–41)
Alkaline Phosphatase: 75 U/L (ref 38–126)
Anion gap: 15 (ref 5–15)
BILIRUBIN TOTAL: 0.7 mg/dL (ref 0.3–1.2)
BUN: 9 mg/dL (ref 8–23)
CO2: 23 mmol/L (ref 22–32)
Calcium: 8.8 mg/dL — ABNORMAL LOW (ref 8.9–10.3)
Chloride: 95 mmol/L — ABNORMAL LOW (ref 98–111)
Creatinine, Ser: 0.64 mg/dL (ref 0.44–1.00)
GFR calc Af Amer: >60 mL/min (ref >60)
GFR calc non Af Amer: >60 mL/min (ref >60)
Glucose, Bld: 86 mg/dL (ref 70–99)
POTASSIUM: 3.5 mmol/L (ref 3.5–5.1)
Sodium: 133 mmol/L — ABNORMAL LOW (ref 135–145)
TOTAL PROTEIN: 7.1 g/dL (ref 6.5–8.1)

## 2018-02-28 LAB — CBC WITH DIFFERENTIAL/PLATELET
Abs Immature Granulocytes: 0.1 10*3/uL (ref 0.0–0.1)
Basophils Absolute: 0.1 10*3/uL (ref 0.0–0.1)
Basophils Relative: 1 %
EOS ABS: 0.1 10*3/uL (ref 0.0–0.7)
EOS PCT: 1 %
HEMATOCRIT: 37.5 % (ref 36.0–46.0)
Hemoglobin: 13 g/dL (ref 12.0–15.0)
Immature Granulocytes: 1 %
LYMPHS ABS: 1.1 10*3/uL (ref 0.7–4.0)
Lymphocytes Relative: 11 %
MCH: 35.8 pg — ABNORMAL HIGH (ref 26.0–34.0)
MCHC: 34.7 g/dL (ref 30.0–36.0)
MCV: 103.3 fL — AB (ref 78.0–100.0)
Monocytes Absolute: 0.5 10*3/uL (ref 0.1–1.0)
Monocytes Relative: 5 %
Neutro Abs: 8.1 10*3/uL — ABNORMAL HIGH (ref 1.7–7.7)
Neutrophils Relative %: 81 %
Platelets: 260 10*3/uL (ref 150–400)
RBC: 3.63 MIL/uL — AB (ref 3.87–5.11)
RDW: 13.3 % (ref 11.5–15.5)
WBC: 9.9 10*3/uL (ref 4.0–10.5)

## 2018-02-28 LAB — ETHANOL: ALCOHOL ETHYL (B): 270 mg/dL — AB (ref <10)

## 2018-02-28 LAB — I-STAT TROPONIN, ED: TROPONIN I, POC: 0 ng/mL (ref 0.00–0.08)

## 2018-02-28 LAB — CK: Total CK: 98 U/L (ref 38–234)

## 2018-02-28 MED ORDER — VITAMIN D 1000 UNITS PO TABS
1000.0000 [IU] | ORAL_TABLET | Freq: Every day | ORAL | Status: DC
  Administered 2018-02-28 – 2018-03-05 (×6): 1000 [IU] via ORAL
  Filled 2018-02-28 (×6): qty 1

## 2018-02-28 MED ORDER — CALCIUM CARBONATE 1250 (500 CA) MG PO TABS
1200.0000 mg | ORAL_TABLET | Freq: Every day | ORAL | Status: DC
  Administered 2018-02-28 – 2018-03-05 (×4): 1250 mg via ORAL
  Filled 2018-02-28 (×6): qty 1

## 2018-02-28 MED ORDER — MEMANTINE HCL 10 MG PO TABS
10.0000 mg | ORAL_TABLET | Freq: Two times a day (BID) | ORAL | Status: DC
  Administered 2018-02-28 – 2018-03-05 (×10): 10 mg via ORAL
  Filled 2018-02-28 (×13): qty 1

## 2018-02-28 MED ORDER — THIAMINE HCL 100 MG/ML IJ SOLN
100.0000 mg | Freq: Once | INTRAMUSCULAR | Status: AC
  Administered 2018-02-28: 100 mg via INTRAVENOUS
  Filled 2018-02-28: qty 2

## 2018-02-28 MED ORDER — BISACODYL 10 MG RE SUPP: 10.0000 mg | Freq: Every day | RECTAL | Status: DC | PRN

## 2018-02-28 MED ORDER — ALBUTEROL SULFATE (2.5 MG/3ML) 0.083% IN NEBU: 2.5000 mg | INHALATION_SOLUTION | Freq: Four times a day (QID) | RESPIRATORY_TRACT | Status: DC | PRN

## 2018-02-28 MED ORDER — ONDANSETRON HCL 4 MG/2ML IJ SOLN
4.0000 mg | Freq: Four times a day (QID) | INTRAMUSCULAR | Status: DC | PRN
  Administered 2018-02-28: 4 mg via INTRAVENOUS
  Filled 2018-02-28: qty 2

## 2018-02-28 MED ORDER — SODIUM CHLORIDE 0.9 % IV SOLN
INTRAVENOUS | Status: AC
  Administered 2018-02-28 – 2018-03-01 (×4): via INTRAVENOUS

## 2018-02-28 MED ORDER — HYDROCODONE-ACETAMINOPHEN 5-325 MG PO TABS
1.0000 | ORAL_TABLET | ORAL | Status: DC | PRN
  Administered 2018-03-01: 1 via ORAL
  Filled 2018-02-28: qty 1

## 2018-02-28 MED ORDER — THIAMINE HCL 100 MG/ML IJ SOLN: 100.0000 mg | Freq: Every day | INTRAMUSCULAR | Status: DC

## 2018-02-28 MED ORDER — LORAZEPAM 1 MG PO TABS: 0.0000 mg | ORAL_TABLET | Freq: Two times a day (BID) | ORAL | Status: DC

## 2018-02-28 MED ORDER — SENNOSIDES-DOCUSATE SODIUM 8.6-50 MG PO TABS
1.0000 | ORAL_TABLET | Freq: Every evening | ORAL | Status: DC | PRN
  Administered 2018-03-02: 1 via ORAL
  Filled 2018-02-28: qty 1

## 2018-02-28 MED ORDER — LORAZEPAM 2 MG/ML IJ SOLN: 0.0000 mg | Freq: Four times a day (QID) | INTRAMUSCULAR | Status: DC

## 2018-02-28 MED ORDER — VITAMIN B-1 100 MG PO TABS
100.0000 mg | ORAL_TABLET | Freq: Every day | ORAL | Status: DC
  Administered 2018-03-01: 100 mg via ORAL
  Filled 2018-02-28: qty 1

## 2018-02-28 MED ORDER — LORAZEPAM 1 MG PO TABS: 0.0000 mg | ORAL_TABLET | Freq: Four times a day (QID) | ORAL | Status: DC

## 2018-02-28 MED ORDER — PRAVASTATIN SODIUM 10 MG PO TABS
20.0000 mg | ORAL_TABLET | Freq: Every day | ORAL | Status: DC
  Administered 2018-02-28 – 2018-03-05 (×6): 20 mg via ORAL
  Filled 2018-02-28 (×2): qty 2
  Filled 2018-02-28: qty 1
  Filled 2018-02-28 (×3): qty 2

## 2018-02-28 MED ORDER — LORAZEPAM 1 MG PO TABS
1.0000 mg | ORAL_TABLET | Freq: Four times a day (QID) | ORAL | Status: AC | PRN
  Administered 2018-03-02: 1 mg via ORAL
  Filled 2018-02-28: qty 1

## 2018-02-28 MED ORDER — NEBIVOLOL HCL 10 MG PO TABS
10.0000 mg | ORAL_TABLET | Freq: Every day | ORAL | Status: DC
  Administered 2018-02-28 – 2018-03-05 (×6): 10 mg via ORAL
  Filled 2018-02-28 (×6): qty 1

## 2018-02-28 MED ORDER — ACETAMINOPHEN 325 MG PO TABS: 650.0000 mg | ORAL_TABLET | Freq: Four times a day (QID) | ORAL | Status: DC | PRN

## 2018-02-28 MED ORDER — VITAMIN B-12 1000 MCG PO TABS
1000.0000 ug | ORAL_TABLET | Freq: Every day | ORAL | Status: DC
  Administered 2018-02-28 – 2018-03-05 (×5): 1000 ug via ORAL
  Filled 2018-02-28 (×5): qty 1

## 2018-02-28 MED ORDER — FOLIC ACID 1 MG PO TABS
1.0000 mg | ORAL_TABLET | Freq: Every day | ORAL | Status: DC
  Administered 2018-02-28 – 2018-03-05 (×6): 1 mg via ORAL
  Filled 2018-02-28 (×6): qty 1

## 2018-02-28 MED ORDER — LIDOCAINE-EPINEPHRINE (PF) 2 %-1:200000 IJ SOLN
10.0000 mL | Freq: Once | INTRAMUSCULAR | Status: AC
  Administered 2018-02-28: 10 mL via INTRADERMAL
  Filled 2018-02-28: qty 20

## 2018-02-28 MED ORDER — VITAMIN B-1 100 MG PO TABS: 100.0000 mg | ORAL_TABLET | Freq: Every day | ORAL | Status: DC

## 2018-02-28 MED ORDER — SODIUM CHLORIDE 0.9 % IV BOLUS
1000.0000 mL | Freq: Once | INTRAVENOUS | Status: AC
  Administered 2018-02-28: 1000 mL via INTRAVENOUS

## 2018-02-28 MED ORDER — DOXYCYCLINE HYCLATE 100 MG PO TABS
100.0000 mg | ORAL_TABLET | Freq: Two times a day (BID) | ORAL | Status: DC
  Administered 2018-02-28 – 2018-03-05 (×11): 100 mg via ORAL
  Filled 2018-02-28 (×11): qty 1

## 2018-02-28 MED ORDER — DONEPEZIL HCL 10 MG PO TABS
10.0000 mg | ORAL_TABLET | Freq: Every day | ORAL | Status: DC
  Administered 2018-02-28 – 2018-03-04 (×5): 10 mg via ORAL
  Filled 2018-02-28 (×5): qty 1

## 2018-02-28 MED ORDER — SERTRALINE HCL 50 MG PO TABS
150.0000 mg | ORAL_TABLET | Freq: Every day | ORAL | Status: DC
  Administered 2018-02-28 – 2018-03-05 (×6): 150 mg via ORAL
  Filled 2018-02-28 (×6): qty 1

## 2018-02-28 MED ORDER — ACETAMINOPHEN 650 MG RE SUPP: 650.0000 mg | Freq: Four times a day (QID) | RECTAL | Status: DC | PRN

## 2018-02-28 MED ORDER — MAGNESIUM SULFATE 2 GM/50ML IV SOLN
2.0000 g | Freq: Once | INTRAVENOUS | Status: AC
Start: 2018-02-28 — End: 2018-02-28
  Administered 2018-02-28: 2 g via INTRAVENOUS
  Filled 2018-02-28: qty 50

## 2018-02-28 MED ORDER — ONDANSETRON HCL 4 MG PO TABS
4.0000 mg | ORAL_TABLET | Freq: Four times a day (QID) | ORAL | Status: DC | PRN
  Administered 2018-03-02: 4 mg via ORAL
  Filled 2018-02-28: qty 1

## 2018-02-28 MED ORDER — LORAZEPAM 2 MG/ML IJ SOLN: 0.0000 mg | Freq: Two times a day (BID) | INTRAMUSCULAR | Status: DC

## 2018-02-28 MED ORDER — LORAZEPAM 2 MG/ML IJ SOLN: 1.0000 mg | Freq: Four times a day (QID) | INTRAMUSCULAR | Status: AC | PRN

## 2018-02-28 MED ORDER — LOSARTAN POTASSIUM 50 MG PO TABS
100.0000 mg | ORAL_TABLET | Freq: Every day | ORAL | Status: DC
  Administered 2018-02-28 – 2018-03-05 (×6): 100 mg via ORAL
  Filled 2018-02-28 (×6): qty 2

## 2018-02-28 MED ORDER — ADULT MULTIVITAMIN W/MINERALS CH
1.0000 | ORAL_TABLET | Freq: Every day | ORAL | Status: DC
  Administered 2018-02-28 – 2018-03-05 (×4): 1 via ORAL
  Filled 2018-02-28 (×5): qty 1

## 2018-02-28 MED ORDER — DOXYCYCLINE HYCLATE 100 MG PO TABS
100.0000 mg | ORAL_TABLET | Freq: Once | ORAL | Status: AC
  Administered 2018-02-28: 100 mg via ORAL
  Filled 2018-02-28: qty 1

## 2018-02-28 NOTE — ED Notes (Signed)
Patient transported to CT scan . 

## 2018-02-28 NOTE — Social Work (Signed)
CSW spoke with RN Case Manager regarding pt, aware PT/OT ordered for pt, await recommendations for discharge planning. If pt agreeable to SNF will need [employer] authorization.   [place], [place] Work [phone]

## 2018-02-28 NOTE — Care Management Note (Signed)
Case Management Note  Patient Details  Name: [name] Fox MRN: [id number] Date of Birth: 1941/02/24  Subjective/Objective:                    Action/Plan:  Received consult for home health vs placement. Requested PT/OT evals.Will await recommendations and MD orders. Expected Discharge Date:                  Expected Discharge Plan:     In-House Referral:     Discharge planning Services     Post Acute Care Choice:    Choice offered to:     DME Arranged:    DME Agency:     HH Arranged:    HH Agency:     Status of Service:     If discussed at [employer], dates discussed:    Additional Comments:  Marilu Favre, RN 02/28/2018, 1:09 PM

## 2018-02-28 NOTE — ED Notes (Signed)
Pt complaining of left sided tenderness to touch over the rib region. Mortimer Fries, RN made aware.

## 2018-02-28 NOTE — Evaluation (Signed)
Physical Therapy Evaluation Patient Details Name: [name] Fox MRN: [id number] DOB: 02-Sep-1940 Today's Date: 02/28/2018   History of Present Illness  Pt is a 77 y/o female with past medical history significant for dementia, alcohol abuse, hypertension, hyperlipidemia, brought to the emergency department after being found by her son on the kitchen floor, surrounded by blood due to a large scalp laceration.  Per son report, it appears that she hit her head on the kitchen counter.  At the time, she was awake and alert, but slow to respond. Pt found to have Nondisplaced fracture of the right posterior 12 and eleventh ribs at the costovertebral junction.    Clinical Impression  Pt presented supine in bed with HOB elevated, awake and willing to participate in therapy session. No family or caregiver present throughout session. Pt currently very limited secondary to reported nausea and cognitive deficits (see below). Pt would continue to benefit from skilled physical therapy services at this time while admitted and after d/c to address the below listed limitations in order to improve overall safety and independence with functional mobility.     Follow Up Recommendations SNF    Equipment Recommendations  None recommended by PT    Recommendations for Other Services       Precautions / Restrictions Precautions Precautions: Fall Restrictions Weight Bearing Restrictions: No      Mobility  Bed Mobility Overal bed mobility: Needs Assistance Bed Mobility: Rolling;Supine to Sit;Sit to Supine Rolling: Min guard   Supine to sit: Min guard Sit to supine: Min guard   General bed mobility comments: pt able to roll towards her L side with min guard, and able to achieve modified long sitting in bed with min guard; pt very limited secondary to cognitive deficits and reported nausea (pt's RN notified and reported had recently given pt nausea medication)  Transfers                 General  transfer comment: pt refusing to participate in OOB activity at this time secondary to nausea  Ambulation/Gait                Stairs            Wheelchair Mobility    Modified Rankin (Stroke Patients Only)       Balance                                             Pertinent Vitals/Pain Pain Assessment: Faces Faces Pain Scale: Hurts little more Pain Location: generalized Pain Descriptors / Indicators: Sore Pain Intervention(s): Monitored during session;Repositioned    Home Living Family/patient expects to be discharged to:: Private residence Living Arrangements: Children Available Help at Discharge: Family;Available PRN/intermittently Type of Home: House Home Access: Stairs to enter Entrance Stairs-Rails: None Entrance Stairs-Number of Steps: 2 Home Layout: One level Home Equipment: None Additional Comments: pt with dementia at baseline and no family/caregivers present to confirm information provided    Prior Function Level of Independence: Independent         Comments: pt with dementia at baseline and no family/caregivers present to confirm information provided     Hand Dominance        Extremity/Trunk Assessment   Upper Extremity Assessment Upper Extremity Assessment: Generalized weakness    Lower Extremity Assessment Lower Extremity Assessment: Generalized weakness       Communication  Communication: HOH  Cognition Arousal/Alertness: Awake/alert Behavior During Therapy: WFL for tasks assessed/performed Overall Cognitive Status: Impaired/Different from baseline Area of Impairment: Memory;Following commands;Safety/judgement;Problem solving;Orientation                 Orientation Level: Disoriented to;Place;Time;Situation   Memory: Decreased short-term memory;Decreased recall of precautions Following Commands: Follows one step commands inconsistently;Follows one step commands with increased  time Safety/Judgement: Decreased awareness of deficits;Decreased awareness of safety   Problem Solving: Slow processing;Decreased initiation;Difficulty sequencing;Requires verbal cues;Requires tactile cues        General Comments      Exercises     Assessment/Plan    PT Assessment Patient needs continued PT services  PT Problem List Decreased strength;Decreased activity tolerance;Decreased balance;Decreased mobility;Decreased coordination;Decreased cognition;Decreased knowledge of use of DME;Decreased safety awareness;Decreased knowledge of precautions       PT Treatment Interventions DME instruction;Therapeutic activities;Gait training;Functional mobility training;Stair training;Therapeutic exercise;Balance training;Neuromuscular re-education;Patient/family education    PT Goals (Current goals can be found in the Care Plan section)  Acute Rehab PT Goals Patient Stated Goal: to sleep PT Goal Formulation: Patient unable to participate in goal setting Time For Goal Achievement: 03/14/18 Potential to Achieve Goals: Fair    Frequency Min 2X/week   Barriers to discharge        Co-evaluation               AM-PAC PT "6 Clicks" Daily Activity  Outcome Measure Difficulty turning over in bed (including adjusting bedclothes, sheets and blankets)?: A Little Difficulty moving from lying on back to sitting on the side of the bed? : A Little Difficulty sitting down on and standing up from a chair with arms (e.g., wheelchair, bedside commode, etc,.)?: Unable Help needed moving to and from a bed to chair (including a wheelchair)?: A Little Help needed walking in hospital room?: A Lot Help needed climbing 3-5 steps with a railing? : A Lot 6 Click Score: 14    End of Session   Activity Tolerance: Patient limited by pain;Other (comment)(pt limited by nausea and cognitive deficits) Patient left: in bed;with call bell/phone within reach;with bed alarm set Nurse Communication:  Mobility status PT Visit Diagnosis: Other abnormalities of gait and mobility (R26.89)    Time: 1194-1740 PT Time Calculation (min) (ACUTE ONLY): 15 min   Charges:   PT Evaluation $PT Eval Low Complexity: [place], PT, [place] 02/28/2018, 4:48 PM

## 2018-02-28 NOTE — ED Notes (Signed)
Dr. Leonette Monarch at bedside suturing pt.'s scalp laceration .

## 2018-02-28 NOTE — H&P (Addendum)
History and Physical    [name] Fox DDU:[id number] DOB: 04-08-1941 DOA: 02/28/2018   PCP: Binnie Rail, MD   Patient coming from:  Home    Chief Complaint:  Alcohol intoxication with Fall   HPI: [name] Fox is a 77 y.o. female with medical history significant for dementia, alcohol abuse, hypertension, hyperlipidemia, brought to the emergency department after being found by her son approximately at 1 AM, on the kitchen floor, surrounded by blood due to a large scalp laceration.  Per son report, it appears that she hit her head on the kitchen counter.  At the time, she was awake and alert, but slow to respond.  History is mainly obtained by her son due to the patient's dementia.  He reports that she is a heavy daily drinker, especially over the last 3 years, after her husband left.  She states that she drinks to escape.  However she only reports drinking 1 large glass of wine a day, although "she may not remember she had a glass before that ".  Son states that she has "taken a turn for the worse since February, when she also was seen at the ED with a hip laceration after another fall due to drinking.  She does have headache due to trauma, but denies any vision changes, no apparent vertigo.  Some denies the patient having any recent infections, fever or chills.  He denies the patient having any sick contacts.  When arriving, she reported right-sided pain with movement especially on the ribs.  No apparent shortness of breath, or cough.  No apparent chest pain, palpitations, syncope or presyncope.  No apparent nausea, vomiting, diarrhea, or any other bleeding issues.  No apparent dysuria or gross hematuria or sound report.  No apparent leg swelling or calf pain.  He reports that she has been losing significant amount of weight since January, currently at 48 kg, was 54 kg in November 2018, "she eats very little".  No tobacco, or recreational drug use.  It is unclear if she is compliant with her medications,  although her son tries to manage them.  Apparently, the only psychiatric evaluation she had he was back in 2014.  She has not been seen by psychiatry or neurology since then.   ED Course:  BP (!) 146/60   Pulse 80   Resp 17   SpO2 94%   Urine negative for nitrites or white blood cells UDS negative EKG sinus rhythm, no acute findings QTC 503 Troponin negative Sodium 133, potassium 3.5, Bicarb 23, Pulse 86, BUN 9, creatinine 0.64, Alkaline phosphatase 75, albumin 4, AST 61, ALT 41 total bilirubin 0.7 White count 9.9, hemoglobin 13, platelets 260 CT of the head and neck no acute intracranial abnormalities  Except for incidental 12 mm left thyroid gland nodule. Based on lesion size and patient age, no further evaluation is indicated. CT of the chest. Nondisplaced fracture of the right posterior 12 and eleventh ribs at the costovertebral junction. 2. Peribronchial thickening with peribronchial infiltrates in the right lung base consistent with bronchopneumonia. 3. Dilated ascending thoracic aorta measuring 4.4 cm diameter  Review of Systems:  As per HPI otherwise all other systems reviewed and are negative  Past Medical History:  Diagnosis Date  . Adenomatous colon polyp 05/26/1999   procedure at [facility] in [place], [place]   . Anemia   . ANXIETY   . DEGENERATIVE JOINT DISEASE, KNEES, BILATERAL   . Dementia 08/31/2010  neuropsyc eval 10/2012 and follows with neuro  . Diverticulosis   . DYSLIPIDEMIA   . Fibrocystic breast disease   . HEARING LOSS, BILATERAL   . Hemorrhoids   . HERPES ZOSTER   . Hypertension   . Neuromuscular disorder ([place])   . OSTEOPENIA   . Osteoporosis    DEXA A LB 09/22/14: -2.4 R fem, progressive decline since 2011    . PMR (polymyalgia rheumatica) (HCC) dx 10/2011   Initially dx [facility], then PMR clarified -on pred, follows with rheum  . VITAMIN B12 DEFICIENCY     Past Surgical History:  Procedure Laterality Date  . Bilateral  Mastectomy     while removing cosmetic silicone implants (NO Breast cancer)  . Bilateral silicone Breast explant  1993   Due to rupture/leak @ [facility]    Social History Social History   Socioeconomic History  . Marital status: Married    Spouse name: Not on file  . Number of children: Not on file  . Years of education: Not on file  . Highest education level: Not on file  Occupational History  . Not on file  Social Needs  . Financial resource strain: Not on file  . Food insecurity:    Worry: Not on file    Inability: Not on file  . Transportation needs:    Medical: Not on file    Non-medical: Not on file  Tobacco Use  . Smoking status: Never Smoker  . Smokeless tobacco: Never Used  Substance and Sexual Activity  . Alcohol use: Yes    Alcohol/week: 3.0 standard drinks    Types: 3 Glasses of wine per week    Comment: Occassional white wine  . Drug use: No  . Sexual activity: Not Currently  Lifestyle  . Physical activity:    Days per week: Not on file    Minutes per session: Not on file  . Stress: Not on file  Relationships  . Social connections:    Talks on phone: Not on file    Gets together: Not on file    Attends religious service: Not on file    Active member of club or organization: Not on file    Attends meetings of clubs or organizations: Not on file    Relationship status: Not on file  . Intimate partner violence:    Fear of current or ex partner: Not on file    Emotionally abused: Not on file    Physically abused: Not on file    Forced sexual activity: Not on file  Other Topics Concern  . Not on file  Social History Narrative   Divorced fall 2015, lives alone. adopted son nearby. Retired from public health-nutritionist     Allergies  Allergen Reactions  . Penicillins Shortness Of Breath, Swelling and Rash  . Amlodipine Rash  . Sulfasalazine Rash    Family History  Problem Relation Age of Onset  . Pancreatic cancer Father   . Heart  disease Brother   . Colon cancer Paternal Grandfather   . Colon cancer Paternal Aunt   . Inflammatory bowel disease Cousin        Prior to Admission medications   Medication Sig Start Date End Date Taking? Authorizing Provider  Calcium Carbonate (CALCIUM 600 PO) Take 1,200 mg by mouth daily.   Yes [provider]  donepezil (ARICEPT) 10 MG tablet Take 1 tablet (10 mg total) at bedtime by mouth. 05/31/17  Yes Burns, Claudina Lick, MD  loratadine (CLARITIN)  10 MG tablet Take 10 mg by mouth daily.   Yes [provider]  losartan (COZAAR) 100 MG tablet Take 1 tablet (100 mg total) by mouth daily. 07/26/17  Yes Burns, Claudina Lick, MD  memantine (NAMENDA) 10 MG tablet Take 1 tablet (10 mg total) by mouth 2 (two) times daily. 07/26/17  Yes Burns, Claudina Lick, MD  nebivolol (BYSTOLIC) 10 MG tablet Take 1 tablet (10 mg total) daily by mouth. 05/31/17  Yes Burns, Claudina Lick, MD  pravastatin (PRAVACHOL) 20 MG tablet Take 1 tablet (20 mg total) by mouth daily. 07/26/17  Yes Burns, Claudina Lick, MD  sertraline (ZOLOFT) 100 MG tablet Take 1.5 tablets (150 mg total) daily by mouth. 05/31/17  Yes Burns, Claudina Lick, MD  triamcinolone cream (KENALOG) 0.1 % Compound 1:1 with Eucerin cream and apply bid as needed to affected rash 05/27/17  Yes Burchette, Alinda Sierras, MD  vitamin B-12 (CYANOCOBALAMIN) 1000 MCG tablet Take 1 tablet (1,000 mcg total) by mouth daily. 08/09/17  Yes Burns, Claudina Lick, MD  Vitamin D, Cholecalciferol, 1000 units TABS Take 1,000 Units by mouth daily.   Yes [provider]     Physical Exam:  Vitals:   02/28/18 0730 02/28/18 0733 02/28/18 0745 02/28/18 0800  BP: (!) 144/74  (!) 149/70 (!) 146/60  Pulse: 75 76 79 80  Resp: 17     SpO2: 96%  92% 94%   Constitutional: NAD, calm, awake, unable to engage in conversation, has dysphoric mood Eyes: PERRL, lids and conjunctivae normal.  No nystagmus noted ENMT: Mucous membranes are dry, without exudate or lesions  Neck: normal, supple, no masses, no  thyromegaly, no apparent neck tenderness Respiratory: clear to auscultation bilaterally, no wheezing, no crackles. Normal respiratory effort  Cardiovascular: Regular rate and rhythm, 1 out of 6 murmur, rubs or gallops. No extremity edema. 2+ pedal pulses. No carotid bruits.  Abdomen: Soft, non tender, No hepatosplenomegaly. Bowel sounds positive.  Musculoskeletal: no clubbing / cyanosis. Moves all extremities.  The patient has tenderness on the right lower ribs around the 12th, as well as obvious tenderness on the scalp, at the area of laceration Skin: no jaundice, large laceration on the superior parietal area, extending to the pre-occipital region, no other lacerations are noted. Neurologic: Sensation intact  Strength equal in all extremities.  DTRs are pronouncing her lower extremity, she also has resting tremor her upper extremities.  At this time, she cannot perform finger-to-nose Psychiatric:   Alert and oriented to self only.  Dysphoric mood, appears anxious at times.    Labs on Admission: I have personally reviewed following labs and imaging studies  CBC: Recent Labs  Lab 02/28/18 0434  WBC 9.9  NEUTROABS 8.1*  HGB 13.0  HCT 37.5  MCV 103.3*  PLT 973    Basic Metabolic Panel: Recent Labs  Lab 02/28/18 0434  NA 133*  K 3.5  CL 95*  CO2 23  GLUCOSE 86  BUN 9  CREATININE 0.64  CALCIUM 8.8*    GFR: CrCl cannot be calculated (Unknown ideal weight.).  Liver Function Tests: Recent Labs  Lab 02/28/18 0434  AST 61*  ALT 41  ALKPHOS 75  BILITOT 0.7  PROT 7.1  ALBUMIN 4.0   No results for input(s): LIPASE, AMYLASE in the last 168 hours. No results for input(s): AMMONIA in the last 168 hours.  Coagulation Profile: No results for input(s): INR, PROTIME in the last 168 hours.  Cardiac Enzymes: No results for input(s): CKTOTAL, CKMB, CKMBINDEX, TROPONINI in  the last 168 hours.  BNP (last 3 results) No results for input(s): PROBNP in the last 8760  hours.  HbA1C: No results for input(s): HGBA1C in the last 72 hours.  CBG: No results for input(s): GLUCAP in the last 168 hours.  Lipid Profile: No results for input(s): CHOL, HDL, LDLCALC, TRIG, CHOLHDL, LDLDIRECT in the last 72 hours.  Thyroid Function Tests: No results for input(s): TSH, T4TOTAL, FREET4, T3FREE, THYROIDAB in the last 72 hours.  Anemia Panel: No results for input(s): VITAMINB12, FOLATE, FERRITIN, TIBC, IRON, RETICCTPCT in the last 72 hours.  Urine analysis:    Component Value Date/Time   COLORURINE STRAW (A) 02/28/2018 0446   APPEARANCEUR CLEAR 02/28/2018 0446   LABSPEC 1.005 02/28/2018 0446   PHURINE 5.0 02/28/2018 0446   GLUCOSEU NEGATIVE 02/28/2018 0446   GLUCOSEU NEGATIVE 06/17/2014 1301   HGBUR MODERATE (A) 02/28/2018 0446   HGBUR moderate 04/09/2010 1326   BILIRUBINUR NEGATIVE 02/28/2018 0446   BILIRUBINUR neg 04/01/2011 1137   KETONESUR 5 (A) 02/28/2018 0446   PROTEINUR NEGATIVE 02/28/2018 0446   UROBILINOGEN 0.2 06/17/2014 1301   NITRITE NEGATIVE 02/28/2018 0446   LEUKOCYTESUR NEGATIVE 02/28/2018 0446    Sepsis Labs: @LABRCNTIP (procalcitonin:4,lacticidven:4) )No results found for this or any previous visit (from the past 240 hour(s)).   Radiological Exams on Admission: Ct Head Wo Contrast  Result Date: 02/28/2018 CLINICAL DATA:  Fall striking head against the base port. No loss of consciousness. Scalp laceration to the top of the head. EXAM: CT HEAD WITHOUT CONTRAST CT CERVICAL SPINE WITHOUT CONTRAST TECHNIQUE: Multidetector CT imaging of the head and cervical spine was performed following the standard protocol without intravenous contrast. Multiplanar CT image reconstructions of the cervical spine were also generated. COMPARISON:  MRI brain 11/06/2011 FINDINGS: CT HEAD FINDINGS Brain: No evidence of acute infarction, hemorrhage, hydrocephalus, extra-axial collection or mass lesion/mass effect. Diffuse cerebral atrophy. Ventricular dilatation  consistent with central atrophy. Low-attenuation changes in the deep white matter consistent with small vessel ischemia. Vascular: No hyperdense vessel or unexpected calcification. Skull: Calvarium appears intact. Sinuses/Orbits: Mucosal thickening in the paranasal sinuses. No acute air-fluid levels. Mastoid air cells are clear. Other: None. CT CERVICAL SPINE FINDINGS Alignment: Normal. Skull base and vertebrae: No acute fracture. No primary bone lesion or focal pathologic process. Old appearing ununited ossicles at the anterior foramen magnum and clivus. Soft tissues and spinal canal: No prevertebral fluid or swelling. No visible canal hematoma. Disc levels: Degenerative changes in the cervical spine with narrowed disc spaces and endplate hypertrophic changes most prominent at C5-6 and C6-7 levels. Degenerative changes in the facet joints. Upper chest: Lung apices are clear. Other: Left thyroid gland nodule measuring 12 mm. IMPRESSION: 1. No acute intracranial abnormalities. Chronic atrophy and small vessel ischemic changes. 2. Normal alignment of the cervical spine. Degenerative changes. No acute displaced fractures identified. 3. 12 mm left thyroid gland nodule. Based on lesion size and patient age, no further evaluation is indicated. Electronically Signed   By: Lucienne Capers M.D.   On: 02/28/2018 05:57   Ct Chest Wo Contrast  Result Date: 02/28/2018 CLINICAL DATA:  Patient lost balance and fell this morning. Suspicion of rib fractures. EXAM: CT CHEST WITHOUT CONTRAST TECHNIQUE: Multidetector CT imaging of the chest was performed following the standard protocol without IV contrast. COMPARISON:  09/25/2007 FINDINGS: Cardiovascular: Normal heart size. No pericardial effusion. Coronary artery calcifications. Dilated ascending thoracic aorta measuring 4.4 cm diameter. Scattered aortic calcifications. Mediastinum/Nodes: No significant lymphadenopathy in the chest. Esophagus is decompressed. 12  mm left thyroid  gland nodule. Lungs/Pleura: Pulmonary hyperinflation. Peribronchial thickening with peribronchial streaky infiltrates in the right lung base. Focal consolidation peripherally in the right lower lung at the costophrenic angle. Appearance suggest bronchopneumonia. Left lung is clear. No pleural effusions. No pneumothorax. Upper Abdomen: No acute changes identified. Musculoskeletal: Nondisplaced fracture of the right posterior 12 and eleventh ribs at the costovertebral junction. Normal alignment of the thoracic spine. No vertebral compression deformities. No depressed sternal fractures. IMPRESSION: 1. Nondisplaced fracture of the right posterior 12 and eleventh ribs at the costovertebral junction. 2. Peribronchial thickening with peribronchial infiltrates in the right lung base consistent with bronchopneumonia. 3. Dilated ascending thoracic aorta measuring 4.4 cm diameter. Recommend annual imaging followup by CTA or MRA. This recommendation follows 2010 ACCF/AHA/AATS/ACR/ASA/SCA/SCAI/SIR/STS/SVM Guidelines for the Diagnosis and Management of Patients with Thoracic Aortic Disease. Circulation. 2010; 121: Y073-X106. Aortic Atherosclerosis (ICD10-I70.0) and Emphysema (ICD10-J43.9). Electronically Signed   By: Lucienne Capers M.D.   On: 02/28/2018 06:02   Ct Cervical Spine Wo Contrast  Result Date: 02/28/2018 CLINICAL DATA:  Fall striking head against the base port. No loss of consciousness. Scalp laceration to the top of the head. EXAM: CT HEAD WITHOUT CONTRAST CT CERVICAL SPINE WITHOUT CONTRAST TECHNIQUE: Multidetector CT imaging of the head and cervical spine was performed following the standard protocol without intravenous contrast. Multiplanar CT image reconstructions of the cervical spine were also generated. COMPARISON:  MRI brain 11/06/2011 FINDINGS: CT HEAD FINDINGS Brain: No evidence of acute infarction, hemorrhage, hydrocephalus, extra-axial collection or mass lesion/mass effect. Diffuse cerebral atrophy.  Ventricular dilatation consistent with central atrophy. Low-attenuation changes in the deep white matter consistent with small vessel ischemia. Vascular: No hyperdense vessel or unexpected calcification. Skull: Calvarium appears intact. Sinuses/Orbits: Mucosal thickening in the paranasal sinuses. No acute air-fluid levels. Mastoid air cells are clear. Other: None. CT CERVICAL SPINE FINDINGS Alignment: Normal. Skull base and vertebrae: No acute fracture. No primary bone lesion or focal pathologic process. Old appearing ununited ossicles at the anterior foramen magnum and clivus. Soft tissues and spinal canal: No prevertebral fluid or swelling. No visible canal hematoma. Disc levels: Degenerative changes in the cervical spine with narrowed disc spaces and endplate hypertrophic changes most prominent at C5-6 and C6-7 levels. Degenerative changes in the facet joints. Upper chest: Lung apices are clear. Other: Left thyroid gland nodule measuring 12 mm. IMPRESSION: 1. No acute intracranial abnormalities. Chronic atrophy and small vessel ischemic changes. 2. Normal alignment of the cervical spine. Degenerative changes. No acute displaced fractures identified. 3. 12 mm left thyroid gland nodule. Based on lesion size and patient age, no further evaluation is indicated. Electronically Signed   By: Lucienne Capers M.D.   On: 02/28/2018 05:57    EKG: Independently reviewed.  Assessment/Plan Active Problems:   Alcohol intoxication ([place])   Dyslipidemia   Hearing loss   Vitamin B12 deficiency   Anxiety state   Depression   Hypertension   Alcohol abuse   Bronchopneumonia   Right rib fracture   Ascending aorta dilatation ([place])   Fall against object  Alcohol intoxication, with fall and resulting laceration (see below).  Patient has a history of alcohol dependence, which has been worse over the last 3 years, and possible K[place]off dementia.  She reports drinking only 1 glass of wine, but she may not recall  drinkingper son report 2-1/2 bottles a day.  no evidence of infections or other metabolic process .  AST 65.  Alcohol level 270.  CT of the head and neck  are negative for fracture.  She did sustain right rib fractures. MedSurg observation  CIWA protocol with B12  Magnesium  CK  Social work consult and care management has been placed by EDP, for possible memory care facility upon discharge    IVF  CMET in a.m. PT/OT  Head Laceration care as per nursing  Will need Psychiatry evaluation as outpatient if patient and son are agreeable  Hyponatremia, mild  likely due to poor oral intake, and ETOH , antidepressants Currently at 133   IV Normal saline Repeat labs in am, will hold SIADH workup for now   Right rib fracture due to fall. Films show  Nondisplaced fracture of the right posterior 12 and eleventh ribs at the costovertebral junction.  Pain control PT/OT   Incidental bronchopneumonia. CT chest showed Peribronchial thickening with peribronchial infiltrates in the right lung base consistent with bronchopneumonia. WBC normal. Afebrile. O2 normal on RA  PLaced on Doxycycline at the ED, continue med Albuterol prn for wheezing, shortness of breath   Hypertension BP  146/60   Pulse 80   Controlled Continue home anti-hypertensive medications    Hyperlipidemia Continue home statins   Anxiety and Depression Dementia Continue  Aricept, Namenda, Zoloft Ativan per protocol  B12 deficiency due to ETOH MCV 102 B12 replenishment  Dilated ascending thoracic aorta measuring 4.4 cm diameter per CT chest  Recommend annual imaging followup by CTA or MRA as outpatient   Prolonged QTC 503, unremarkable EKG, TN neg  Cardiac monitoring EKG in am    DVT prophylaxis:  SCD due to scalp bleed  Code Status:    Full code  Family Communication:  Discussed with patient's son Disposition Plan: Expect patient to be discharged to home after condition improves Consults called:    None Admission  status: MEdsurg obs   Sharene Butters, PA-C [facility]   Amion text  [phone]   02/28/2018, 8:31 AM

## 2018-02-28 NOTE — ED Notes (Signed)
Dr. Cardama at bedside.  

## 2018-02-28 NOTE — ED Provider Notes (Signed)
[facility] EMERGENCY DEPARTMENT Provider Note  CSN: [id number] Arrival date & time: 02/28/18 6283  Chief Complaint(s) Fall  HPI [name] Fox is a 77 y.o. female with a history of dementia and alcohol abuse presents to the emergency department after being found down by her son approximately 3 hours ago.  Son reports that the last time he saw her was at 3 PM.  When he came home around 1 AM this morning he found her on the kitchen floor surrounded by blood.  It appears that the patient hit her head on the kitchen counter.  States that she was awake and alert but slow to respond.  Fall resulted in scalp laceration.  Patient also complaining of chest wall pain.  Denies any other physical complaints including neck pain, back pain, abdominal pain, extremity pain, hip pain.  She is currently alert and oriented x3.  Patient does not remember the cause of her injury.    Son reports that she is a heavy daily drinker.  She reports that she only drinks 1 large glass of wine per day.    HPI  Past Medical History Past Medical History:  Diagnosis Date  . Adenomatous colon polyp 05/26/1999   procedure at [facility] in [place], [place]   . ANXIETY   . DEGENERATIVE JOINT DISEASE, KNEES, BILATERAL   . Dementia 08/31/2010   neuropsyc eval 10/2012 and follows with neuro  . Diverticulosis   . DYSLIPIDEMIA   . Fibrocystic breast disease   . HEARING LOSS, BILATERAL   . Hemorrhoids   . HERPES ZOSTER   . OSTEOPENIA   . Osteoporosis    DEXA A LB 09/22/14: -2.4 R fem, progressive decline since 2011    . PMR (polymyalgia rheumatica) (HCC) dx 10/2011   Initially dx [facility], then PMR clarified -on pred, follows with rheum  . VITAMIN B12 DEFICIENCY    Patient Active Problem List   Diagnosis Date Noted  . Rash and nonspecific skin eruption 02/23/2017  . Numbness and tingling in right hand   . Hypertension 11/15/2013  . Weight loss, unintentional 04/12/2013  .  Depression   . PMR (polymyalgia rheumatica) (HCC) 10/31/2011  . Vitamin B12 deficiency 08/31/2010  . Anxiety state 08/31/2010  . Dementia 08/31/2010  . Dyslipidemia 12/08/2009  . KELOID 11/19/2009  . HEARING LOSS, BILATERAL 08/20/2009  . 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 BRONCHIECTASIS WITHOUT ACUTE EXACERBATION 10/18/2007  . HERPES ZOSTER 09/10/2007  . ALLERGIC RHINITIS 04/06/2007  . FIBROCYSTIC BREAST DISEASE 04/06/2007  . Osteoporosis    Home Medication(s) Prior to Admission medications   Medication Sig Start Date End Date Taking? Authorizing Provider  Calcium Carbonate (CALCIUM 600 PO) Take 1,200 mg by mouth daily.   Yes [provider]  donepezil (ARICEPT) 10 MG tablet Take 1 tablet (10 mg total) at bedtime by mouth. 05/31/17  Yes Burns, Claudina Lick, MD  loratadine (CLARITIN) 10 MG tablet Take 10 mg by mouth daily.   Yes [provider]  losartan (COZAAR) 100 MG tablet Take 1 tablet (100 mg total) by mouth daily. 07/26/17  Yes Burns, Claudina Lick, MD  memantine (NAMENDA) 10 MG tablet Take 1 tablet (10 mg total) by mouth 2 (two) times daily. 07/26/17  Yes Burns, Claudina Lick, MD  nebivolol (BYSTOLIC) 10 MG tablet Take 1 tablet (10 mg total) daily by mouth. 05/31/17  Yes Burns, Claudina Lick, MD  pravastatin (PRAVACHOL) 20 MG tablet Take 1 tablet (20 mg  total) by mouth daily. 07/26/17  Yes Burns, Claudina Lick, MD  sertraline (ZOLOFT) 100 MG tablet Take 1.5 tablets (150 mg total) daily by mouth. 05/31/17  Yes Burns, Claudina Lick, MD  triamcinolone cream (KENALOG) 0.1 % Compound 1:1 with Eucerin cream and apply bid as needed to affected rash 05/27/17  Yes Burchette, Alinda Sierras, MD  vitamin B-12 (CYANOCOBALAMIN) 1000 MCG tablet Take 1 tablet (1,000 mcg total) by mouth daily. 08/09/17  Yes Burns, Claudina Lick, MD  Vitamin D, Cholecalciferol, 1000 units TABS Take 1,000 Units by mouth daily.   Yes [provider]                                                                                                                                     Past Surgical History Past Surgical History:  Procedure Laterality Date  . Bilateral Mastectomy     while removing cosmetic silicone implants (NO Breast cancer)  . Bilateral silicone Breast explant  1993   Due to rupture/leak @ [facility]   Family History Family History  Problem Relation Age of Onset  . Pancreatic cancer Father   . Heart disease Brother   . Colon cancer Paternal Grandfather   . Colon cancer Paternal Aunt   . Inflammatory bowel disease Cousin     Social History Social History   Tobacco Use  . Smoking status: Never Smoker  . Smokeless tobacco: Never Used  Substance Use Topics  . Alcohol use: Yes    Alcohol/week: 3.0 standard drinks    Types: 3 Glasses of wine per week    Comment: Occassional white wine  . Drug use: No   Allergies Penicillins; Amlodipine; and Sulfasalazine  Review of Systems Review of Systems All other systems are reviewed and are negative for acute change except as noted in the HPI  Physical Exam Vital Signs  I have reviewed the triage vital signs BP (!) 150/67   Pulse 72   Resp (!) 21   SpO2 100%   Physical Exam  Constitutional: She is oriented to person, place, and time. She appears well-developed and well-nourished. No distress.  HENT:  Head: Normocephalic. Head is with laceration.    Right Ear: External ear normal.  Left Ear: External ear normal.  Nose: Nose normal.  Eyes: Pupils are equal, round, and reactive to light. Conjunctivae and EOM are normal. Right eye exhibits no discharge. Left eye exhibits no discharge. No scleral icterus.  Neck: Normal range of motion. Neck supple.  Cardiovascular: Normal rate, regular rhythm and normal heart sounds. Exam reveals no gallop and no friction rub.  No murmur heard. Pulses:      Radial pulses are 2+ on the right side, and 2+ on the left side.       Dorsalis pedis pulses are 2+ on the right side, and 2+ on the left  side.  Pulmonary/Chest: Effort normal and breath sounds normal. No stridor. No  respiratory distress. She has no wheezes. She exhibits tenderness.    Abdominal: Soft. She exhibits no distension. There is no tenderness.  Musculoskeletal: She exhibits no edema or tenderness.       Cervical back: She exhibits no bony tenderness.       Thoracic back: She exhibits no bony tenderness.       Lumbar back: She exhibits no bony tenderness.  Clavicles stable. Chest stable to AP/Lat compression. Pelvis stable to Lat compression. No obvious extremity deformity. No chest or abdominal wall contusion.  Neurological: She is alert and oriented to person, place, and time.  Moving all extremities  Skin: Skin is warm and dry. No rash noted. She is not diaphoretic. No erythema.  Psychiatric: She has a normal mood and affect.    ED Results and Treatments Labs (all labs ordered are listed, but only abnormal results are displayed) Labs Reviewed  CBC WITH DIFFERENTIAL/PLATELET - Abnormal; Notable for the following components:      Result Value   RBC 3.63 (*)    MCV 103.3 (*)    MCH 35.8 (*)    Neutro Abs 8.1 (*)    All other components within normal limits  COMPREHENSIVE METABOLIC PANEL - Abnormal; Notable for the following components:   Sodium 133 (*)    Chloride 95 (*)    Calcium 8.8 (*)    AST 61 (*)    All other components within normal limits  ETHANOL - Abnormal; Notable for the following components:   Alcohol, Ethyl (B) 270 (*)    All other components within normal limits  URINALYSIS, ROUTINE W REFLEX MICROSCOPIC - Abnormal; Notable for the following components:   Color, Urine STRAW (*)    Hgb urine dipstick MODERATE (*)    Ketones, ur 5 (*)    All other components within normal limits  RAPID URINE DRUG SCREEN, HOSP PERFORMED  I-STAT TROPONIN, ED                                                                                                                         EKG  EKG  Interpretation  Date/Time:  Wednesday February 28 2018 05:55:05 EDT Ventricular Rate:  70 PR Interval:    QRS Duration: 106 QT Interval:  466 QTC Calculation: 503 R Axis:   52 Text Interpretation:  Accelerated junctional rhythm Nonspecific T abnormalities, lateral leads Prolonged QT interval Artifact Confirmed by Addison Lank [phone]) on 02/28/2018 6:14:15 AM      Radiology Ct Head Wo Contrast  Result Date: 02/28/2018 CLINICAL DATA:  Fall striking head against the base port. No loss of consciousness. Scalp laceration to the top of the head. EXAM: CT HEAD WITHOUT CONTRAST CT CERVICAL SPINE WITHOUT CONTRAST TECHNIQUE: Multidetector CT imaging of the head and cervical spine was performed following the standard protocol without intravenous contrast. Multiplanar CT image reconstructions of the cervical spine were also generated. COMPARISON:  MRI brain 11/06/2011 FINDINGS: CT HEAD FINDINGS Brain: No evidence of acute infarction,  hemorrhage, hydrocephalus, extra-axial collection or mass lesion/mass effect. Diffuse cerebral atrophy. Ventricular dilatation consistent with central atrophy. Low-attenuation changes in the deep white matter consistent with small vessel ischemia. Vascular: No hyperdense vessel or unexpected calcification. Skull: Calvarium appears intact. Sinuses/Orbits: Mucosal thickening in the paranasal sinuses. No acute air-fluid levels. Mastoid air cells are clear. Other: None. CT CERVICAL SPINE FINDINGS Alignment: Normal. Skull base and vertebrae: No acute fracture. No primary bone lesion or focal pathologic process. Old appearing ununited ossicles at the anterior foramen magnum and clivus. Soft tissues and spinal canal: No prevertebral fluid or swelling. No visible canal hematoma. Disc levels: Degenerative changes in the cervical spine with narrowed disc spaces and endplate hypertrophic changes most prominent at C5-6 and C6-7 levels. Degenerative changes in the facet joints. Upper chest: Lung  apices are clear. Other: Left thyroid gland nodule measuring 12 mm. IMPRESSION: 1. No acute intracranial abnormalities. Chronic atrophy and small vessel ischemic changes. 2. Normal alignment of the cervical spine. Degenerative changes. No acute displaced fractures identified. 3. 12 mm left thyroid gland nodule. Based on lesion size and patient age, no further evaluation is indicated. Electronically Signed   By: Lucienne Capers M.D.   On: 02/28/2018 05:57   Ct Chest Wo Contrast  Result Date: 02/28/2018 CLINICAL DATA:  Patient lost balance and fell this morning. Suspicion of rib fractures. EXAM: CT CHEST WITHOUT CONTRAST TECHNIQUE: Multidetector CT imaging of the chest was performed following the standard protocol without IV contrast. COMPARISON:  09/25/2007 FINDINGS: Cardiovascular: Normal heart size. No pericardial effusion. Coronary artery calcifications. Dilated ascending thoracic aorta measuring 4.4 cm diameter. Scattered aortic calcifications. Mediastinum/Nodes: No significant lymphadenopathy in the chest. Esophagus is decompressed. 12 mm left thyroid gland nodule. Lungs/Pleura: Pulmonary hyperinflation. Peribronchial thickening with peribronchial streaky infiltrates in the right lung base. Focal consolidation peripherally in the right lower lung at the costophrenic angle. Appearance suggest bronchopneumonia. Left lung is clear. No pleural effusions. No pneumothorax. Upper Abdomen: No acute changes identified. Musculoskeletal: Nondisplaced fracture of the right posterior 12 and eleventh ribs at the costovertebral junction. Normal alignment of the thoracic spine. No vertebral compression deformities. No depressed sternal fractures. IMPRESSION: 1. Nondisplaced fracture of the right posterior 12 and eleventh ribs at the costovertebral junction. 2. Peribronchial thickening with peribronchial infiltrates in the right lung base consistent with bronchopneumonia. 3. Dilated ascending thoracic aorta measuring 4.4 cm  diameter. Recommend annual imaging followup by CTA or MRA. This recommendation follows 2010 ACCF/AHA/AATS/ACR/ASA/SCA/SCAI/SIR/STS/SVM Guidelines for the Diagnosis and Management of Patients with Thoracic Aortic Disease. Circulation. 2010; 121: X324-M010. Aortic Atherosclerosis (ICD10-I70.0) and Emphysema (ICD10-J43.9). Electronically Signed   By: Lucienne Capers M.D.   On: 02/28/2018 06:02   Ct Cervical Spine Wo Contrast  Result Date: 02/28/2018 CLINICAL DATA:  Fall striking head against the base port. No loss of consciousness. Scalp laceration to the top of the head. EXAM: CT HEAD WITHOUT CONTRAST CT CERVICAL SPINE WITHOUT CONTRAST TECHNIQUE: Multidetector CT imaging of the head and cervical spine was performed following the standard protocol without intravenous contrast. Multiplanar CT image reconstructions of the cervical spine were also generated. COMPARISON:  MRI brain 11/06/2011 FINDINGS: CT HEAD FINDINGS Brain: No evidence of acute infarction, hemorrhage, hydrocephalus, extra-axial collection or mass lesion/mass effect. Diffuse cerebral atrophy. Ventricular dilatation consistent with central atrophy. Low-attenuation changes in the deep white matter consistent with small vessel ischemia. Vascular: No hyperdense vessel or unexpected calcification. Skull: Calvarium appears intact. Sinuses/Orbits: Mucosal thickening in the paranasal sinuses. No acute air-fluid levels. Mastoid air cells are clear.  Other: None. CT CERVICAL SPINE FINDINGS Alignment: Normal. Skull base and vertebrae: No acute fracture. No primary bone lesion or focal pathologic process. Old appearing ununited ossicles at the anterior foramen magnum and clivus. Soft tissues and spinal canal: No prevertebral fluid or swelling. No visible canal hematoma. Disc levels: Degenerative changes in the cervical spine with narrowed disc spaces and endplate hypertrophic changes most prominent at C5-6 and C6-7 levels. Degenerative changes in the facet  joints. Upper chest: Lung apices are clear. Other: Left thyroid gland nodule measuring 12 mm. IMPRESSION: 1. No acute intracranial abnormalities. Chronic atrophy and small vessel ischemic changes. 2. Normal alignment of the cervical spine. Degenerative changes. No acute displaced fractures identified. 3. 12 mm left thyroid gland nodule. Based on lesion size and patient age, no further evaluation is indicated. Electronically Signed   By: Lucienne Capers M.D.   On: 02/28/2018 05:57   Pertinent labs & imaging results that were available during my care of the patient were reviewed by me and considered in my medical decision making (see chart for details).  Medications Ordered in ED Medications  doxycycline (VIBRA-TABS) tablet 100 mg (has no administration in time range)  sodium chloride 0.9 % bolus 1,000 mL (0 mLs Intravenous Stopped 02/28/18 0603)  lidocaine-EPINEPHrine (XYLOCAINE W/EPI) 2 %-1:200000 (PF) injection 10 mL (10 mLs Intradermal Given 02/28/18 0603)                                                                                                                                    Procedures .Marland KitchenLaceration Repair Date/Time: 02/28/2018 7:09 AM Performed by: Fatima Blank, MD Authorized by: Fatima Blank, MD   Consent:    Consent obtained:  Verbal   Consent given by:  Healthcare agent   Risks discussed:  Poor cosmetic result and poor wound healing   Alternatives discussed:  Delayed treatment and no treatment Anesthesia (see MAR for exact dosages):    Anesthesia method:  Local infiltration   Local anesthetic:  Lidocaine 1% w/o epi Laceration details:    Location:  Scalp   Scalp location:  L parietal   Length (cm):  11 Repair type:    Repair type:  Intermediate Pre-procedure details:    Preparation:  Patient was prepped and draped in usual sterile fashion and imaging obtained to evaluate for foreign bodies Exploration:    Wound extent: no foreign bodies/material noted  and no vascular damage noted     Contaminated: no   Treatment:    Area cleansed with:  Betadine   Amount of cleaning:  Extensive   Irrigation solution:  Sterile saline   Irrigation volume:  1000   Irrigation method:  Syringe   Visualized foreign bodies/material removed: no   Skin repair:    Repair method:  Staples   Number of staples:  9 Approximation:    Approximation:  Close Post-procedure details:    Patient tolerance of procedure:  Tolerated  well, no immediate complications    (including critical care time)  Medical Decision Making / ED Course I have reviewed the nursing notes for this encounter and the patient's prior records (if available in EHR or on provided paperwork).    Fall resulting in head trauma.  Patient also with chest wall tenderness.  Trauma work-up as above without ICH.  Revealed right-sided posterior ribs 12 and 11 nondisplaced fractures.  Also notable for right bronchopneumonia.  When informed the son did report that the patient had recent cough.  Patient allergic to penicillins.  Will treat with doxycycline.  With laceration thoroughly irrigated and closed as above.  Will require staple removal in 7 to 10 days.  With frequent re-evaluations, it appears that patient has short-term memory loss which the patient's confirmed.  The patient lives at home with her son, however she may benefit from home health or placement.  Given her short-term memory loss, I wonder whether this has an effect on her continued alcohol usage as she may only think she is drinking 1 glass/day when she actually drinks more.  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  Will discuss case with medicine for admission and continued work-up and management.  Is there is a pleasant like a stable thanks    Final Clinical Impression(s) / ED Diagnoses Final diagnoses:  Injury of head, initial encounter  Laceration of scalp, initial encounter   Closed fracture of multiple ribs of right side, initial encounter  Bronchopneumonia  Alcohol abuse  Memory deficit      This chart was dictated using voice recognition software.  Despite best efforts to proofread,  errors can occur which can change the documentation meaning.   Fatima Blank, MD 02/28/18 [phone]

## 2018-02-28 NOTE — ED Triage Notes (Signed)
Patient arrived with EMS from home lost her balance and fell this morning , she hit her head against the baseboard , no LOC  , repetitive questioning with smell of ETOH with breathing , scalp laceration approx. 2" at top of head with moderate bleeding / clotted blood at hair . CBG= 86 , denies pain / respirations unlabored .

## 2018-03-01 ENCOUNTER — Other Ambulatory Visit: Payer: Self-pay

## 2018-03-01 DIAGNOSIS — J180 Bronchopneumonia, unspecified organism: Secondary | ICD-10-CM | POA: Diagnosis not present

## 2018-03-01 DIAGNOSIS — S2241XA Multiple fractures of ribs, right side, initial encounter for closed fracture: Secondary | ICD-10-CM | POA: Diagnosis not present

## 2018-03-01 DIAGNOSIS — F10229 Alcohol dependence with intoxication, unspecified: Secondary | ICD-10-CM | POA: Diagnosis not present

## 2018-03-01 DIAGNOSIS — I77810 Thoracic aortic ectasia: Secondary | ICD-10-CM

## 2018-03-01 DIAGNOSIS — S0101XA Laceration without foreign body of scalp, initial encounter: Secondary | ICD-10-CM | POA: Diagnosis not present

## 2018-03-01 DIAGNOSIS — F10921 Alcohol use, unspecified with intoxication delirium: Secondary | ICD-10-CM | POA: Diagnosis not present

## 2018-03-01 LAB — CBC
HCT: 30.6 % — ABNORMAL LOW (ref 36.0–46.0)
Hemoglobin: 10.5 g/dL — ABNORMAL LOW (ref 12.0–15.0)
MCH: 36 pg — ABNORMAL HIGH (ref 26.0–34.0)
MCHC: 34.3 g/dL (ref 30.0–36.0)
MCV: 104.8 fL — ABNORMAL HIGH (ref 78.0–100.0)
PLATELETS: 232 10*3/uL (ref 150–400)
RBC: 2.92 MIL/uL — ABNORMAL LOW (ref 3.87–5.11)
RDW: 13.5 % (ref 11.5–15.5)
WBC: 5.7 10*3/uL (ref 4.0–10.5)

## 2018-03-01 LAB — COMPREHENSIVE METABOLIC PANEL
ALBUMIN: 3.2 g/dL — AB (ref 3.5–5.0)
ALT: 28 U/L (ref 0–44)
AST: 35 U/L (ref 15–41)
Alkaline Phosphatase: 58 U/L (ref 38–126)
Anion gap: 9 (ref 5–15)
BUN: 12 mg/dL (ref 8–23)
CHLORIDE: 99 mmol/L (ref 98–111)
CO2: 26 mmol/L (ref 22–32)
Calcium: 8.4 mg/dL — ABNORMAL LOW (ref 8.9–10.3)
Creatinine, Ser: 0.86 mg/dL (ref 0.44–1.00)
GFR calc Af Amer: >60 mL/min (ref >60)
GFR calc non Af Amer: >60 mL/min (ref >60)
GLUCOSE: 89 mg/dL (ref 70–99)
Potassium: 4 mmol/L (ref 3.5–5.1)
SODIUM: 134 mmol/L — AB (ref 135–145)
Total Bilirubin: 1.6 mg/dL — ABNORMAL HIGH (ref 0.3–1.2)
Total Protein: 5.5 g/dL — ABNORMAL LOW (ref 6.5–8.1)

## 2018-03-01 LAB — VITAMIN B12: Vitamin B-12: 631 pg/mL (ref 180–914)

## 2018-03-01 MED ORDER — ENOXAPARIN SODIUM 40 MG/0.4ML ~~LOC~~ SOLN
40.0000 mg | SUBCUTANEOUS | Status: DC
  Administered 2018-03-01 – 2018-03-05 (×5): 40 mg via SUBCUTANEOUS
  Filled 2018-03-01 (×5): qty 0.4

## 2018-03-01 MED ORDER — THIAMINE HCL 100 MG/ML IJ SOLN
500.0000 mg | INTRAVENOUS | Status: AC
  Administered 2018-03-01 – 2018-03-03 (×3): 500 mg via INTRAVENOUS
  Filled 2018-03-01 (×3): qty 5

## 2018-03-01 NOTE — Clinical Social Work Note (Signed)
Clinical Social Work Assessment  Patient Details  Name: [name] Fox MRN: [id number] Date of Birth: Oct 23, 1940  Date of referral:  03/01/18               Reason for consult:  Facility Placement, Discharge Planning                Permission sought to share information with:  Family Supports, Customer service manager Permission granted to share information::  Yes, Verbal Permission Granted  Name::     [name] Fox  Agency::  SNFs  Relationship::  son  Contact Information:  [phone]  Housing/Transportation Living arrangements for the past 2 months:  [place] of Information:  Patient, Adult Children Patient Interpreter Needed:  None Criminal Activity/Legal Involvement Pertinent to Current Situation/Hospitalization:  No - Comment as needed Significant Relationships:  Adult Children Lives with:  Adult Children Do you feel safe going back to the place where you live?  Yes Need for family participation in patient care:  Yes (Comment)  Care giving concerns:  Pt has hx of dementia, as well as daily ETOH use, fell and suffered a head injury. Will need SNF level therapies per PT/OT, pt son in agreement.    Social Worker assessment / plan:  CSW met with pt at bedside, pt documented to be A&Ox3 but was confused regarding CSW visit. After explaining reason for visit pt gave permission for CSW to speak with pt son [name] Fox. Pt son [name] Fox available via phone. Pt son amenable to SNF placement, he has moved in with pt but works variable shifts and is unable to provide 24/7 assistance. CSW explained SNF referral process and that CSW will leave offers at bedside.   Employment status:  Retired Nurse, adult PT Recommendations:  [place] / Referral to community resources:  [place]  Patient/Family's Response to care: Pt son amenable to sNF referral. Would like SNF for pt before returning  home.   Patient/Family's Understanding of and Emotional Response to Diagnosis, Current Treatment, and Prognosis:  Pt son states understanding of diagnosis, current treatment and prognosis. Pt son states that he would like SNF level therapies for his mom, has concerns about her coming home post fall. Pt son states understanding that insurance authorization needed for discharge. Pt son was emotionally appropriate and responsive, expresses reasonable expectations for pt at discharge.    Emotional Assessment Appearance:  Appears stated age Attitude/Demeanor/Rapport:  Gracious, Inconsistent Affect (typically observed):  Pleasant Orientation:  Oriented to Self, Oriented to Place Alcohol / Substance use:  Alcohol Use Psych involvement (Current and /or in the community):  No (Comment)  Discharge Needs  Concerns to be addressed:  Care Coordination, Substance Abuse Concerns Readmission within the last 30 days:  No Current discharge risk:  Cognitively Impaired, Substance Abuse, Physical Impairment Barriers to Discharge:  [employer], Continued Medical Work up   [employer], [place] 03/01/2018, 1:42 PM

## 2018-03-01 NOTE — Care Management Obs Status (Signed)
[place] NOTIFICATION   Patient Details  Name: [name] Fox MRN: [id number] Date of Birth: May 04, 1941   Medicare Observation Status Notification Given:  Yes    Georgeanna Lea, RN 03/01/2018, 3:40 PM

## 2018-03-01 NOTE — Progress Notes (Signed)
Physical Therapy Treatment Patient Details Name: [name] Fox MRN: [id number] DOB: 04-28-1941 Today's Date: 03/01/2018    History of Present Illness Pt is a 77 y/o female with past medical history significant for dementia, alcohol abuse, hypertension, hyperlipidemia, brought to the emergency department after being found by her son on the kitchen floor, surrounded by blood due to a large scalp laceration.  Per son report, it appears that she hit her head on the kitchen counter.  At the time, she was awake and alert, but slow to respond. Pt found to have Nondisplaced fracture of the right posterior 12 and eleventh ribs at the costovertebral junction.    PT Comments    Pt making steady progress with functional mobility and tolerated short distance ambulation this session; however, she appears to be at an increased risk for falls. Therefore, PT continuing to recommend pt d/c to SNF.   Pt would continue to benefit from skilled physical therapy services at this time while admitted and after d/c to address the below listed limitations in order to improve overall safety and independence with functional mobility.    Follow Up Recommendations  SNF     Equipment Recommendations  None recommended by PT    Recommendations for Other Services       Precautions / Restrictions Precautions Precautions: Fall Restrictions Weight Bearing Restrictions: No    Mobility  Bed Mobility               General bed mobility comments: pt sitting EOB upon arrival  Transfers Overall transfer level: Needs assistance Equipment used: 1 person hand held assist Transfers: Sit to/from Stand Sit to Stand: Min assist         General transfer comment: min A for stability with transition into standing from EOB x1 and from chair in hallway x1  Ambulation/Gait Ambulation/Gait assistance: Min assist;Min guard Gait Distance (Feet): 75 Feet(75' x2 with sitting rest break in between) Assistive device: 2 person  hand held assist;1 person hand held assist Gait Pattern/deviations: Step-through pattern;Decreased stride length;Drifts right/left Gait velocity: decreased Gait velocity interpretation: <1.8 ft/sec, indicate of risk for recurrent falls General Gait Details: pt with modest instability requiring intermittent min A and constant close min guard for safety   Stairs             Wheelchair Mobility    Modified Rankin (Stroke Patients Only)       Balance Overall balance assessment: Needs assistance Sitting-balance support: Feet supported Sitting balance-Leahy Scale: Good     Standing balance support: Single extremity supported Standing balance-Leahy Scale: Poor                              Cognition Arousal/Alertness: Awake/alert Behavior During Therapy: WFL for tasks assessed/performed Overall Cognitive Status: Impaired/Different from baseline Area of Impairment: Memory;Following commands;Safety/judgement;Problem solving;Orientation                 Orientation Level: Disoriented to;Situation;Time   Memory: Decreased recall of precautions;Decreased short-term memory Following Commands: Follows one step commands with increased time Safety/Judgement: Decreased awareness of safety;Decreased awareness of deficits   Problem Solving: Slow processing;Difficulty sequencing;Requires verbal cues        Exercises      General Comments        Pertinent Vitals/Pain Pain Assessment: No/denies pain    Home Living  Prior Function            PT Goals (current goals can now be found in the care plan section) Acute Rehab PT Goals PT Goal Formulation: Patient unable to participate in goal setting Time For Goal Achievement: 03/14/18 Potential to Achieve Goals: Fair Progress towards PT goals: Progressing toward goals    Frequency    Min 2X/week      PT Plan Current plan remains appropriate    Co-evaluation               AM-PAC PT "6 Clicks" Daily Activity  Outcome Measure  Difficulty turning over in bed (including adjusting bedclothes, sheets and blankets)?: A Little Difficulty moving from lying on back to sitting on the side of the bed? : A Little Difficulty sitting down on and standing up from a chair with arms (e.g., wheelchair, bedside commode, etc,.)?: Unable Help needed moving to and from a bed to chair (including a wheelchair)?: A Little Help needed walking in hospital room?: A Little Help needed climbing 3-5 steps with a railing? : A Lot 6 Click Score: 15    End of Session Equipment Utilized During Treatment: Gait belt Activity Tolerance: Patient limited by fatigue Patient left: in chair;with call bell/phone within reach;with chair alarm set Nurse Communication: Mobility status PT Visit Diagnosis: Other abnormalities of gait and mobility (R26.89)     Time: 7356-7014 PT Time Calculation (min) (ACUTE ONLY): 11 min  Charges:  $Gait Training: 8-22 mins                     [place], [place], [place] 03/01/2018, 4:39 PM

## 2018-03-01 NOTE — NC FL2 (Signed)
[place] LEVEL OF CARE SCREENING TOOL     IDENTIFICATION  Patient Name: [name] Fox Birthdate: January 16, 1941 Sex: female Admission Date (Current Location): 02/28/2018  [facility] and [place] Number:  Herbalist and Address:  [facility] [place], [place] [address], [place], [place] [address]      Provider Number: [id number]  Attending Physician Name and Address:  Jonetta Osgood, MD  Relative Name and Phone Number:  Eriyanna Kofoed, son, [phone]    Current Level of Care: Hospital Recommended Level of Care: [place] Prior Approval Number:    Date Approved/Denied:   PASRR Number:    Discharge Plan: SNF    Current Diagnoses: Patient Active Problem List   Diagnosis Date Noted  . Alcohol abuse 02/28/2018  . Alcohol intoxication ([place]) 02/28/2018  . Bronchopneumonia 02/28/2018  . Right rib fracture 02/28/2018  . Ascending aorta dilatation (HCC) 02/28/2018  . Fall against object 02/28/2018  . Rash and nonspecific skin eruption 02/23/2017  . Numbness and tingling in right hand   . Hypertension 11/15/2013  . Weight loss, unintentional 04/12/2013  . Depression   . PMR (polymyalgia rheumatica) (HCC) 10/31/2011  . Vitamin B12 deficiency 08/31/2010  . Anxiety state 08/31/2010  . Dementia 08/31/2010  . Dyslipidemia 12/08/2009  . KELOID 11/19/2009  . Hearing loss 08/20/2009  . 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 BRONCHIECTASIS WITHOUT ACUTE EXACERBATION 10/18/2007  . HERPES ZOSTER 09/10/2007  . ALLERGIC RHINITIS 04/06/2007  . FIBROCYSTIC BREAST DISEASE 04/06/2007  . Osteoporosis     Orientation RESPIRATION BLADDER Height & Weight     Self, Situation, Place  Normal Continent Weight: 104 lb 8 oz (47.4 kg) Height:  5\' 7"  (170.2 cm)  BEHAVIORAL SYMPTOMS/MOOD NEUROLOGICAL BOWEL NUTRITION STATUS      Continent Diet(see discharge summary)  AMBULATORY STATUS COMMUNICATION OF NEEDS Skin   Extensive  Assist Verbally Other (Comment)(laceration on head with staples)                       Personal Care Assistance Level of Assistance  Bathing, Feeding, Dressing Bathing Assistance: Maximum assistance Feeding assistance: Independent Dressing Assistance: Limited assistance     Functional Limitations Info  Hearing, Speech, Sight Sight Info: Adequate Hearing Info: Adequate Speech Info: Adequate    SPECIAL CARE FACTORS FREQUENCY  PT (By licensed PT), OT (By licensed OT)     PT Frequency: 5x week OT Frequency: 5x week            Contractures Contractures Info: Not present    Additional Factors Info  Code Status, Allergies, Psychotropic Code Status Info: Full Code Allergies Info: PENICILLINS, AMLODIPINE, SULFASALAZINE  Psychotropic Info: donepezil (ARICEPT) tablet 10 mg daily at bedtime; memantine (NAMENDA) tablet 10 mg 2x daily PO; sertraline (ZOLOFT) tablet 150 mg daily PO         Current Medications (03/01/2018):  This is the current hospital active medication list Current Facility-Administered Medications  Medication Dose Route Frequency Provider Last Rate Last Dose  . 0.9 %  sodium chloride infusion   Intravenous Continuous Karmen Bongo, MD 100 mL/hr at 03/01/18 [phone]    . albuterol (PROVENTIL) (2.5 MG/3ML) 0.083% nebulizer solution 2.5 mg  2.5 mg Nebulization Q6H PRN Rondel Jumbo, PA-C      . bisacodyl (DULCOLAX) suppository 10 mg  10 mg Rectal Daily PRN Sharene Butters E, PA-C      . calcium carbonate (OS-CAL - dosed in mg of elemental  calcium) tablet 1,250 mg  1,250 mg Oral Daily Rondel Jumbo, PA-C   1,250 mg at 03/01/18 2440  . cholecalciferol (VITAMIN D) tablet 1,000 Units  1,000 Units Oral Daily Rondel Jumbo, PA-C   1,000 Units at 03/01/18 1027  . donepezil (ARICEPT) tablet 10 mg  10 mg Oral QHS Rondel Jumbo, PA-C   10 mg at 02/28/18 2112  . doxycycline (VIBRA-TABS) tablet 100 mg  100 mg Oral Q12H Rondel Jumbo, PA-C   100 mg at 25/36/64 4034  .  folic acid (FOLVITE) tablet 1 mg  1 mg Oral Daily Rondel Jumbo, PA-C   1 mg at 03/01/18 7425  . HYDROcodone-acetaminophen (NORCO/VICODIN) 5-325 MG per tablet 1-2 tablet  1-2 tablet Oral Q4H PRN Rondel Jumbo, PA-C      . LORazepam (ATIVAN) tablet 1 mg  1 mg Oral Q6H PRN Rondel Jumbo, PA-C       Or  . LORazepam (ATIVAN) injection 1 mg  1 mg Intravenous Q6H PRN Rondel Jumbo, PA-C      . losartan (COZAAR) tablet 100 mg  100 mg Oral Daily Rondel Jumbo, PA-C   100 mg at 03/01/18 9563  . memantine (NAMENDA) tablet 10 mg  10 mg Oral BID Rondel Jumbo, PA-C   10 mg at 03/01/18 8756  . multivitamin with minerals tablet 1 tablet  1 tablet Oral Daily Rondel Jumbo, PA-C   1 tablet at 03/01/18 4332  . nebivolol (BYSTOLIC) tablet 10 mg  10 mg Oral Daily Rondel Jumbo, PA-C   10 mg at 03/01/18 9518  . ondansetron (ZOFRAN) tablet 4 mg  4 mg Oral Q6H PRN Rondel Jumbo, PA-C       Or  . ondansetron [facility]) injection 4 mg  4 mg Intravenous Q6H PRN Rondel Jumbo, PA-C   4 mg at 02/28/18 1228  . pravastatin (PRAVACHOL) tablet 20 mg  20 mg Oral Daily Rondel Jumbo, PA-C   20 mg at 03/01/18 8416  . senna-docusate (Senokot-S) tablet 1 tablet  1 tablet Oral QHS PRN Rondel Jumbo, PA-C      . sertraline (ZOLOFT) tablet 150 mg  150 mg Oral Daily Rondel Jumbo, PA-C   150 mg at 03/01/18 6063  . thiamine (VITAMIN B-1) tablet 100 mg  100 mg Oral Daily Rondel Jumbo, PA-C   100 mg at 03/01/18 0160   Or  . thiamine (B-1) injection 100 mg  100 mg Intravenous Daily Wertman, Sara E, PA-C      . vitamin B-12 (CYANOCOBALAMIN) tablet 1,000 mcg  1,000 mcg Oral Daily Rondel Jumbo, PA-C   1,000 mcg at 03/01/18 1093     Discharge Medications: Please see discharge summary for a list of discharge medications.  Relevant Imaging Results:  Relevant Lab Results:   Additional Information SS# Grenora Good[place], [place]

## 2018-03-01 NOTE — Progress Notes (Addendum)
PROGRESS NOTE        PATIENT DETAILS Name: [name] Fox Age: 77 y.o. Sex: female Date of Birth: 1941-04-02 Admit Date: 02/28/2018 Admitting Physician Karmen Bongo, MD [id number], Claudina Lick, MD  Brief Narrative: Patient is a 77 y.o. female with history of hypertension, dyslipidemia, EtOH use presented with a mechanical fall with resultant scalp injury.  Alcohol level was 270 on admission.  See below for further details   Subjective: Most of my questions appropriately but needs a lot of redirection and repetition.  Not sure if she does not remember or is hiding issues/downplaying issues with alcohol-as she claims that she does not remember drinking at all yesterday.  Her son at bedside-claims that she starts drinking from early in the morning and will continue to late at night.  At times will drink around 2-3 bottles of wine daily.  Assessment/Plan: Mechanical fall due to alcohol intoxication and a scalp laceration: Scalp was sutured in the emergency room, per son at bedside she is up-to-date with her tetanus vaccination.  Evaluated by PT-SNF on discharge.  Patient not very keen-apparently has a history of dementia at baseline-family (son at bedside) appears very frustrated.  Social worker following.  Alcohol abuse: Currently not tremulous-needs a lot of repetition/redirection when asked questions to get right answers (dementia-?  korsakoff).  Continue with CIWA protocol-per son-she binges on plain-and goes to 2-3 bottles on a daily basis at times.  She started drinking approximately 3 years back with husband of 35 years left her.  Right-sided rib fracture: Nondisplaced-supportive care.  Encourage incentive spirometry.  Right lung pneumonia: Could be aspiration pneumonia in the setting of alcohol abuse/intoxication-continue with doxycycline-as she is afebrile and nontoxic-appearing.   Hypertension: Controlled with losartan and Bystolic  Dementia/depression: Continue  Zoloft, Namenda and Aricept.  Not sure if she has some amount of Korsakoff's syndrome due to chronic alcohol use-start high-dose thiamine for a few days.  But not sure if she is downplaying her alcohol issues or she just does not remember.  Difficult situation-she is not keen on going to a skilled nursing facility-but does not refuse outright at this time-family seems to be very frustrated with this overall situation-we will go out and get a psychiatric evaluation for medication assistance and also to evaluate capacity.Will check vitamin B12 (on supplementation) and RPR.  TSH was within normal limits in November 2018.  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  DVT Prophylaxis: Prophylactic Lovenox   Code Status: Full code  Family Communication: None at bedside  Disposition Plan: Remain inpatient-but will plan on Home health vs SNF on discharge over the next few days  Antimicrobial agents: Anti-infectives (From admission, onward)   Start     Dose/Rate Route Frequency Ordered Stop   02/28/18 1000  doxycycline (VIBRA-TABS) tablet 100 mg     100 mg Oral Every 12 hours 02/28/18 0828     02/28/18 0700  doxycycline (VIBRA-TABS) tablet 100 mg     100 mg Oral  Once 02/28/18 2585 02/28/18 0657      Procedures: None  CONSULTS:  psychiatry  Time spent: 25- minutes-Greater than 50% of this time was spent in counseling, explanation of diagnosis, planning of further management, and coordination of care.  MEDICATIONS: Scheduled Meds: . calcium carbonate  1,250 mg Oral Daily  .  cholecalciferol  1,000 Units Oral Daily  . donepezil  10 mg Oral QHS  . doxycycline  100 mg Oral Q12H  . folic acid  1 mg Oral Daily  . losartan  100 mg Oral Daily  . memantine  10 mg Oral BID  . multivitamin with minerals  1 tablet Oral Daily  . nebivolol  10 mg Oral Daily  . pravastatin  20 mg Oral Daily  . sertraline  150 mg Oral  Daily  . thiamine  100 mg Oral Daily   Or  . thiamine  100 mg Intravenous Daily  . vitamin B-12  1,000 mcg Oral Daily   Continuous Infusions: . sodium chloride 100 mL/hr at 03/01/18 1323   PRN Meds:.albuterol, bisacodyl, HYDROcodone-acetaminophen, LORazepam **OR** LORazepam, ondansetron **OR** ondansetron (ZOFRAN) IV, senna-docusate   PHYSICAL EXAM: Vital signs: Vitals:   03/01/18 0446 03/01/18 0500 03/01/18 0900 03/01/18 1344  BP: (!) 133/57   129/61  Pulse: (!) 55   (!) 53  Resp: 16   16  Temp: 98.2 F (36.8 C)   (!) 97.5 F (36.4 C)  TempSrc: Oral   Oral  SpO2: 97%   97%  Weight:  47.4 kg    Height:   5\' 7"  (1.702 m)    Filed Weights   03/01/18 0500  Weight: 47.4 kg   Body mass index is 16.37 kg/m.   General appearance :Awake, alert, not in any distress.  Eyes:Pink conjunctiva HEENT: Atraumatic and Normocephalic Neck: supple, no JVD.  Resp:Good air entry bilaterally, no added sounds  CVS: S1 S2 regular, no murmurs.  GI: Bowel sounds present, Non tender and not distended with no gaurding, rigidity or rebound.No organomegaly Extremities: B/L Lower Ext shows no edema, both legs are warm to touch Neurology:  Non focal Musculoskeletal:No digital cyanosis Skin:No Rash, warm and dry Wounds:N/A  I have personally reviewed following labs and imaging studies  LABORATORY DATA: CBC: Recent Labs  Lab 02/28/18 0434 03/01/18 0447  WBC 9.9 5.7  NEUTROABS 8.1*  --   HGB 13.0 10.5*  HCT 37.5 30.6*  MCV 103.3* 104.8*  PLT 260 756    Basic Metabolic Panel: Recent Labs  Lab 02/28/18 0434 03/01/18 0447  NA 133* 134*  K 3.5 4.0  CL 95* 99  CO2 23 26  GLUCOSE 86 89  BUN 9 12  CREATININE 0.64 0.86  CALCIUM 8.8* 8.4*    GFR: Estimated Creatinine Clearance: 42.3 mL/min (by C-G formula based on SCr of 0.86 mg/dL).  Liver Function Tests: Recent Labs  Lab 02/28/18 0434 03/01/18 0447  AST 61* 35  ALT 41 28  ALKPHOS 75 58  BILITOT 0.7 1.6*  PROT 7.1 5.5*    ALBUMIN 4.0 3.2*   No results for input(s): LIPASE, AMYLASE in the last 168 hours. No results for input(s): AMMONIA in the last 168 hours.  Coagulation Profile: No results for input(s): INR, PROTIME in the last 168 hours.  Cardiac Enzymes: Recent Labs  Lab 02/28/18 0434  CKTOTAL 98    BNP (last 3 results) No results for input(s): PROBNP in the last 8760 hours.  HbA1C: No results for input(s): HGBA1C in the last 72 hours.  CBG: No results for input(s): GLUCAP in the last 168 hours.  Lipid Profile: No results for input(s): CHOL, HDL, LDLCALC, TRIG, CHOLHDL, LDLDIRECT in the last 72 hours.  Thyroid Function Tests: No results for input(s): TSH, T4TOTAL, FREET4, T3FREE, THYROIDAB in the last 72 hours.  Anemia Panel: No results for input(s): VITAMINB12, FOLATE,  FERRITIN, TIBC, IRON, RETICCTPCT in the last 72 hours.  Urine analysis:    Component Value Date/Time   COLORURINE STRAW (A) 02/28/2018 0446   APPEARANCEUR CLEAR 02/28/2018 0446   LABSPEC 1.005 02/28/2018 0446   PHURINE 5.0 02/28/2018 0446   GLUCOSEU NEGATIVE 02/28/2018 0446   GLUCOSEU NEGATIVE 06/17/2014 1301   HGBUR MODERATE (A) 02/28/2018 0446   HGBUR moderate 04/09/2010 1326   BILIRUBINUR NEGATIVE 02/28/2018 0446   BILIRUBINUR neg 04/01/2011 1137   KETONESUR 5 (A) 02/28/2018 0446   PROTEINUR NEGATIVE 02/28/2018 0446   UROBILINOGEN 0.2 06/17/2014 1301   NITRITE NEGATIVE 02/28/2018 0446   LEUKOCYTESUR NEGATIVE 02/28/2018 0446    Sepsis Labs: Lactic Acid, Venous No results found for: LATICACIDVEN  MICROBIOLOGY: No results found for this or any previous visit (from the past 240 hour(s)).  RADIOLOGY STUDIES/RESULTS: Ct Head Wo Contrast  Result Date: 02/28/2018 CLINICAL DATA:  Fall striking head against the base port. No loss of consciousness. Scalp laceration to the top of the head. EXAM: CT HEAD WITHOUT CONTRAST CT CERVICAL SPINE WITHOUT CONTRAST TECHNIQUE: Multidetector CT imaging of the head and  cervical spine was performed following the standard protocol without intravenous contrast. Multiplanar CT image reconstructions of the cervical spine were also generated. COMPARISON:  MRI brain 11/06/2011 FINDINGS: CT HEAD FINDINGS Brain: No evidence of acute infarction, hemorrhage, hydrocephalus, extra-axial collection or mass lesion/mass effect. Diffuse cerebral atrophy. Ventricular dilatation consistent with central atrophy. Low-attenuation changes in the deep white matter consistent with small vessel ischemia. Vascular: No hyperdense vessel or unexpected calcification. Skull: Calvarium appears intact. Sinuses/Orbits: Mucosal thickening in the paranasal sinuses. No acute air-fluid levels. Mastoid air cells are clear. Other: None. CT CERVICAL SPINE FINDINGS Alignment: Normal. Skull base and vertebrae: No acute fracture. No primary bone lesion or focal pathologic process. Old appearing ununited ossicles at the anterior foramen magnum and clivus. Soft tissues and spinal canal: No prevertebral fluid or swelling. No visible canal hematoma. Disc levels: Degenerative changes in the cervical spine with narrowed disc spaces and endplate hypertrophic changes most prominent at C5-6 and C6-7 levels. Degenerative changes in the facet joints. Upper chest: Lung apices are clear. Other: Left thyroid gland nodule measuring 12 mm. IMPRESSION: 1. No acute intracranial abnormalities. Chronic atrophy and small vessel ischemic changes. 2. Normal alignment of the cervical spine. Degenerative changes. No acute displaced fractures identified. 3. 12 mm left thyroid gland nodule. Based on lesion size and patient age, no further evaluation is indicated. Electronically Signed   By: Lucienne Capers M.D.   On: 02/28/2018 05:57   Ct Chest Wo Contrast  Result Date: 02/28/2018 CLINICAL DATA:  Patient lost balance and fell this morning. Suspicion of rib fractures. EXAM: CT CHEST WITHOUT CONTRAST TECHNIQUE: Multidetector CT imaging of the  chest was performed following the standard protocol without IV contrast. COMPARISON:  09/25/2007 FINDINGS: Cardiovascular: Normal heart size. No pericardial effusion. Coronary artery calcifications. Dilated ascending thoracic aorta measuring 4.4 cm diameter. Scattered aortic calcifications. Mediastinum/Nodes: No significant lymphadenopathy in the chest. Esophagus is decompressed. 12 mm left thyroid gland nodule. Lungs/Pleura: Pulmonary hyperinflation. Peribronchial thickening with peribronchial streaky infiltrates in the right lung base. Focal consolidation peripherally in the right lower lung at the costophrenic angle. Appearance suggest bronchopneumonia. Left lung is clear. No pleural effusions. No pneumothorax. Upper Abdomen: No acute changes identified. Musculoskeletal: Nondisplaced fracture of the right posterior 12 and eleventh ribs at the costovertebral junction. Normal alignment of the thoracic spine. No vertebral compression deformities. No depressed sternal fractures. IMPRESSION: 1. Nondisplaced fracture  of the right posterior 12 and eleventh ribs at the costovertebral junction. 2. Peribronchial thickening with peribronchial infiltrates in the right lung base consistent with bronchopneumonia. 3. Dilated ascending thoracic aorta measuring 4.4 cm diameter. Recommend annual imaging followup by CTA or MRA. This recommendation follows 2010 ACCF/AHA/AATS/ACR/ASA/SCA/SCAI/SIR/STS/SVM Guidelines for the Diagnosis and Management of Patients with Thoracic Aortic Disease. Circulation. 2010; 121: Z610-R604. Aortic Atherosclerosis (ICD10-I70.0) and Emphysema (ICD10-J43.9). Electronically Signed   By: Lucienne Capers M.D.   On: 02/28/2018 06:02   Ct Cervical Spine Wo Contrast  Result Date: 02/28/2018 CLINICAL DATA:  Fall striking head against the base port. No loss of consciousness. Scalp laceration to the top of the head. EXAM: CT HEAD WITHOUT CONTRAST CT CERVICAL SPINE WITHOUT CONTRAST TECHNIQUE: Multidetector CT  imaging of the head and cervical spine was performed following the standard protocol without intravenous contrast. Multiplanar CT image reconstructions of the cervical spine were also generated. COMPARISON:  MRI brain 11/06/2011 FINDINGS: CT HEAD FINDINGS Brain: No evidence of acute infarction, hemorrhage, hydrocephalus, extra-axial collection or mass lesion/mass effect. Diffuse cerebral atrophy. Ventricular dilatation consistent with central atrophy. Low-attenuation changes in the deep white matter consistent with small vessel ischemia. Vascular: No hyperdense vessel or unexpected calcification. Skull: Calvarium appears intact. Sinuses/Orbits: Mucosal thickening in the paranasal sinuses. No acute air-fluid levels. Mastoid air cells are clear. Other: None. CT CERVICAL SPINE FINDINGS Alignment: Normal. Skull base and vertebrae: No acute fracture. No primary bone lesion or focal pathologic process. Old appearing ununited ossicles at the anterior foramen magnum and clivus. Soft tissues and spinal canal: No prevertebral fluid or swelling. No visible canal hematoma. Disc levels: Degenerative changes in the cervical spine with narrowed disc spaces and endplate hypertrophic changes most prominent at C5-6 and C6-7 levels. Degenerative changes in the facet joints. Upper chest: Lung apices are clear. Other: Left thyroid gland nodule measuring 12 mm. IMPRESSION: 1. No acute intracranial abnormalities. Chronic atrophy and small vessel ischemic changes. 2. Normal alignment of the cervical spine. Degenerative changes. No acute displaced fractures identified. 3. 12 mm left thyroid gland nodule. Based on lesion size and patient age, no further evaluation is indicated. Electronically Signed   By: Lucienne Capers M.D.   On: 02/28/2018 05:57     LOS: 0 days   Oren Binet, MD  [facility]  If 7PM-7AM, please contact night-coverage  Please page via [email or web address] [email or web address]-click on MD name and type text  message  03/01/2018, 1:50 PM

## 2018-03-01 NOTE — Care Management Note (Signed)
Case Management Note  Patient Details  Name: [name] Fox MRN: [id number] Date of Birth: [birth year]  Subjective/Objective: Pt presented for fall. PTA from home with support of son and husband. PT recommendations for SNF. CSW is following for disposition needs.                  Action/Plan: CM will continue to monitor.   Expected Discharge Date:                  Expected Discharge Plan:  Skilled Nursing Facility  In-House Referral:  Clinical Social Work  Discharge planning Services  CM Consult  Post Acute Care Choice:    Choice offered to:     DME Arranged:    DME Agency:     HH Arranged:    [place] Agency:     Status of Service:  In process, will continue to follow  If discussed at Long Length of Stay Meetings, dates discussed:    Additional Comments:  Bethena Roys, RN 03/01/2018, 3:46 PM

## 2018-03-01 NOTE — Evaluation (Addendum)
Occupational Therapy Evaluation Patient Details Name: [name] Fox MRN: [id number] DOB: September 17, 1940 Today's Date: 03/01/2018    History of Present Illness Pt is a 77 y/o female with past medical history significant for dementia, alcohol abuse, hypertension, hyperlipidemia, brought to the emergency department after being found by her son on the kitchen floor, surrounded by blood due to a large scalp laceration.  Per son report, it appears that she hit her head on the kitchen counter.  At the time, she was awake and alert, but slow to respond. Pt found to have Nondisplaced fracture of the right posterior 12 and eleventh ribs at the costovertebral junction.   Clinical Impression   This 77 y/o female presents with the above. At baseline pt reports independence with ADLs and functional mobility, lives with son who she reports is home intermittently throughout the day. Pt presenting with cognitive impairments, decreased dynamic balance, and generalized weakness. Pt requiring minA (HHA) for room level functional mobility this session; pt often seeking out UE support with mobility and functional tasks. Pt currently requires minA for LB and toileting ADLs, supervision for seated UB ADLs. Pt also demonstrating cognitive impairments including decreased STM and requiring redirection to tasks at hand. Pt will benefit from continued OT services. Due to pt's current assist level and cognitive impairments recommend follow up SNF level services at time of discharge to maximize her safety and independence with ADLs and mobility. Will continue to follow acutely to progress pt towards established OT goals.    Follow Up Recommendations  SNF;Supervision/Assistance - 24 hour(SNF vs 24hr assist)    Equipment Recommendations  Tub/shower seat           Precautions / Restrictions Precautions Precautions: Fall Restrictions Weight Bearing Restrictions: No      Mobility Bed Mobility Overal bed mobility: Needs  Assistance Bed Mobility: Supine to Sit Rolling: Min guard   Supine to sit: Min guard Sit to supine: Min guard   General bed mobility comments: minguard and VCs for safety, no physical assist required   Transfers Overall transfer level: Needs assistance Equipment used: None;1 person hand held assist Transfers: Sit to/from Stand Sit to Stand: Min guard;Min assist         General transfer comment: intermittent minA (HHA) to rise and steady upon standing; pt often seeking UE support for standing and dynamic balance    Balance Overall balance assessment: Needs assistance Sitting-balance support: Feet supported Sitting balance-Leahy Scale: Good     Standing balance support: Single extremity supported;No upper extremity supported Standing balance-Leahy Scale: Poor Standing balance comment: minA for standing/dynamic balance; pt often reaching out for items in room for additional UE support                            ADL either performed or assessed with clinical judgement   ADL Overall ADL's : Needs assistance/impaired Eating/Feeding: Supervision/ safety;Sitting   Grooming: Min guard;Minimal assistance;Standing;Wash/dry hands Grooming Details (indicate cue type and reason): intermittent minA standing balance; pt locating items at sink (soap, paper towels) and sequencing through steps for washing hands appropriately  Upper Body Bathing: Min guard;Sitting   Lower Body Bathing: Minimal assistance;Sit to/from stand   Upper Body Dressing : Min guard;Sitting   Lower Body Dressing: Minimal assistance;Sit to/from stand Lower Body Dressing Details (indicate cue type and reason): pt able to easily bring LEs towards her to adjust socks; minA standing balance Toilet Transfer: Minimal assistance;Ambulation;Regular Toilet;Grab bars   Toileting-  Clothing Manipulation and Hygiene: Minimal assistance;Sit to/from stand Toileting - Clothing Manipulation Details (indicate cue type and  reason): assist and cues for gown management      Functional mobility during ADLs: Minimal assistance General ADL Comments: pt ambulating in room to bathroom for toileting and standing grooming ADL prior to transfer to recliner; use of HHA as pt often seeking UE support on items in room during mobility                          Pertinent Vitals/Pain Pain Assessment: No/denies pain Faces Pain Scale: Hurts little more     Hand Dominance     Extremity/Trunk Assessment Upper Extremity Assessment Upper Extremity Assessment: Generalized weakness   Lower Extremity Assessment Lower Extremity Assessment: Defer to PT evaluation       Communication Communication Communication: HOH   Cognition Arousal/Alertness: Awake/alert Behavior During Therapy: WFL for tasks assessed/performed Overall Cognitive Status: Impaired/Different from baseline Area of Impairment: Memory;Following commands;Safety/judgement;Problem solving;Orientation                 Orientation Level: Situation   Memory: Decreased short-term memory;Decreased recall of precautions Following Commands: Follows one step commands inconsistently;Follows one step commands with increased time Safety/Judgement: Decreased awareness of deficits;Decreased awareness of safety   Problem Solving: Slow processing;Decreased initiation;Difficulty sequencing;Requires verbal cues;Requires tactile cues General Comments: pt often repeating same questions and statements during session                     expects to be discharged to:: Private residence Living Arrangements: Children Available Help at Discharge: Family;Available PRN/intermittently Type of Home: House Home Access: Stairs to enter [employer] of Steps: 2 Entrance Stairs-Rails: None Home Layout: One level               Home Equipment: None          Prior Functioning/Environment Level of Independence:  Independent                 OT Problem List: Decreased strength;Impaired balance (sitting and/or standing);Decreased activity tolerance      OT Treatment/Interventions: Self-care/ADL training;DME and/or AE instruction;Therapeutic activities;Balance training;Therapeutic exercise;Patient/family education;Energy conservation    OT Goals(Current goals can be found in the care plan section) Acute Rehab OT Goals Patient Stated Goal: ready for breakfast  OT Goal Formulation: With patient Time For Goal Achievement: 03/15/18 Potential to Achieve Goals: Good  OT Frequency: Min 2X/week   Barriers to D/C:            Co-evaluation              AM-PAC PT "6 Clicks" Daily Activity     Outcome Measure Help from another person eating meals?: None Help from another person taking care of personal grooming?: A Little Help from another person toileting, which includes using toliet, bedpan, or urinal?: A Little Help from another person bathing (including washing, rinsing, drying)?: A Little Help from another person to put on and taking off regular upper body clothing?: None Help from another person to put on and taking off regular lower body clothing?: A Little 6 Click Score: 20   End of Session Equipment Utilized During Treatment: Gait belt Nurse Communication: Mobility status  Activity Tolerance: Patient tolerated treatment well Patient left: in chair;with call bell/phone within reach;with chair alarm set;with family/visitor present  OT Visit Diagnosis: Muscle weakness (generalized) (M62.81);Unsteadiness on feet (R26.81)  Time: 5910-2890 OT Time Calculation (min): 29 min Charges:  OT General Charges $OT Visit: 1 Visit OT Evaluation $OT Eval Moderate Complexity: 1 Mod OT Treatments $Self Care/Home Management : 8-22 mins  Lou Cal, OT Pager [phone] 03/01/2018   Raymondo Band 03/01/2018, 10:08 AM

## 2018-03-02 ENCOUNTER — Telehealth: Payer: Self-pay | Admitting: Internal Medicine

## 2018-03-02 DIAGNOSIS — S2231XA Fracture of one rib, right side, initial encounter for closed fracture: Secondary | ICD-10-CM | POA: Diagnosis not present

## 2018-03-02 DIAGNOSIS — J180 Bronchopneumonia, unspecified organism: Secondary | ICD-10-CM | POA: Diagnosis not present

## 2018-03-02 DIAGNOSIS — F329 Major depressive disorder, single episode, unspecified: Secondary | ICD-10-CM

## 2018-03-02 DIAGNOSIS — Z1289 Encounter for screening for malignant neoplasm of other sites: Secondary | ICD-10-CM | POA: Diagnosis not present

## 2018-03-02 DIAGNOSIS — F10929 Alcohol use, unspecified with intoxication, unspecified: Secondary | ICD-10-CM

## 2018-03-02 DIAGNOSIS — F10921 Alcohol use, unspecified with intoxication delirium: Secondary | ICD-10-CM | POA: Diagnosis not present

## 2018-03-02 DIAGNOSIS — Z008 Encounter for other general examination: Secondary | ICD-10-CM | POA: Diagnosis not present

## 2018-03-02 DIAGNOSIS — S2241XA Multiple fractures of ribs, right side, initial encounter for closed fracture: Secondary | ICD-10-CM | POA: Diagnosis not present

## 2018-03-02 LAB — RPR: RPR Ser Ql: NONREACTIVE

## 2018-03-02 NOTE — Progress Notes (Signed)
Physical Therapy Treatment Patient Details Name: [name] Fox MRN: [id number] DOB: 1940/09/10 Today's Date: 03/02/2018    History of Present Illness Pt is a 77 y/o female with past medical history significant for dementia, alcohol abuse, hypertension, hyperlipidemia, brought to the emergency department after being found by her son on the kitchen floor, surrounded by blood due to a large scalp laceration.  Per son report, it appears that she hit her head on the kitchen counter.  At the time, she was awake and alert, but slow to respond. Pt found to have Nondisplaced fracture of the right posterior 12 and eleventh ribs at the costovertebral junction.    PT Comments    Pt very limited secondary to fatigue and nausea after breakfast. Pt's son present this session. PT introduced self and role in pt's treatment as well as POC. Continuing to recommend pt d/c to SNF for further intensive therapy services prior to returning home. PT will continue to follow acutely to progress mobility as tolerated.    Follow Up Recommendations  SNF     Equipment Recommendations  None recommended by PT    Recommendations for Other Services       Precautions / Restrictions Precautions Precautions: Fall Restrictions Weight Bearing Restrictions: No    Mobility  Bed Mobility Overal bed mobility: Needs Assistance Bed Mobility: Rolling Rolling: Supervision         General bed mobility comments: pt rolling towards R during repositioning; pt required min A to scoot up in bed for improved positioning; pt declining further mobility at this time despite max encouragement  Transfers                 General transfer comment: pt declining any OOB mobility at this time  Ambulation/Gait                 Stairs             Wheelchair Mobility    Modified Rankin (Stroke Patients Only)       Balance                                            Cognition  Arousal/Alertness: Awake/alert Behavior During Therapy: WFL for tasks assessed/performed Overall Cognitive Status: Impaired/Different from baseline Area of Impairment: Orientation;Memory;Following commands;Safety/judgement;Problem solving                 Orientation Level: Disoriented to;Situation   Memory: Decreased recall of precautions;Decreased short-term memory Following Commands: Follows one step commands with increased time Safety/Judgement: Decreased awareness of safety;Decreased awareness of deficits   Problem Solving: Slow processing;Difficulty sequencing;Requires verbal cues        Exercises Other Exercises Other Exercises: pt performed glute bridging in bed x5 with supervision and cueing    General Comments        Pertinent Vitals/Pain Pain Assessment: Faces Faces Pain Scale: Hurts a little bit Pain Location: ribs Pain Descriptors / Indicators: Sore Pain Intervention(s): Monitored during session;Repositioned    Home Living                      Prior Function            PT Goals (current goals can now be found in the care plan section) Acute Rehab PT Goals PT Goal Formulation: Patient unable to participate in goal setting Time For Goal Achievement:  03/14/18 Potential to Achieve Goals: Fair Progress towards PT goals: Progressing toward goals    Frequency    Min 2X/week      PT Plan Current plan remains appropriate    Co-evaluation              AM-PAC PT "6 Clicks" Daily Activity  Outcome Measure  Difficulty turning over in bed (including adjusting bedclothes, sheets and blankets)?: None Difficulty moving from lying on back to sitting on the side of the bed? : None Difficulty sitting down on and standing up from a chair with arms (e.g., wheelchair, bedside commode, etc,.)?: Unable Help needed moving to and from a bed to chair (including a wheelchair)?: A Little Help needed walking in hospital room?: A Little Help needed  climbing 3-5 steps with a railing? : A Lot 6 Click Score: 17    End of Session   Activity Tolerance: Patient limited by fatigue;Other (comment)(pt limited secondary to nausea after breakfast) Patient left: in bed;with call bell/phone within reach;with bed alarm set;with family/visitor present Nurse Communication: Mobility status PT Visit Diagnosis: Other abnormalities of gait and mobility (R26.89)     Time: 1030-1314 PT Time Calculation (min) (ACUTE ONLY): 16 min  Charges:  $Therapeutic Activity: 8-22 mins                     [place], [place], [place] 03/02/2018, 10:41 AM

## 2018-03-02 NOTE — Consult Note (Signed)
[facility] Consult   Reason for Consult: Capacity evaluation  Referring Physician:  Dr. Sloan Leiter  Patient Identification: [name] Fox MRN:  [id number] Principal Diagnosis: Evaluation by psychiatric service required Diagnosis:   Patient Active Problem List   Diagnosis Date Noted  . Alcohol abuse [F10.10] 02/28/2018  . Alcohol intoxication ([place]) [F10.929] 02/28/2018  . Bronchopneumonia [J18.0] 02/28/2018  . Right rib fracture [S22.31XA] 02/28/2018  . 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 Fall against object [W18.00XA] 02/28/2018  . Rash and nonspecific skin eruption [R21] 02/23/2017  . Numbness and tingling in right hand [R20.0, R20.2]   . Hypertension [I10] 11/15/2013  . Weight loss, unintentional [R63.4] 04/12/2013  . Depression [F32.9]   . PMR (polymyalgia rheumatica) (HCC) [M35.3] 10/31/2011  . Vitamin B12 deficiency [E53.8] 08/31/2010  . Anxiety state [F41.1] 08/31/2010  . Dementia [F03.90] 08/31/2010  . Dyslipidemia [E78.5] 12/08/2009  . 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 Hearing loss [H91.90] 08/20/2009  . 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 BRONCHIECTASIS WITHOUT ACUTE EXACERBATION [J47.9] 10/18/2007  . HERPES ZOSTER [B02.9] 09/10/2007  . ALLERGIC RHINITIS [J30.9] 04/06/2007  . FIBROCYSTIC BREAST DISEASE [N60.19] 04/06/2007  . Osteoporosis [M81.0]     Total Time spent with patient: 1 hour  Subjective:   [name] Fox is a 77 y.o. female patient admitted with a mechanical fall.  HPI:   Per chart review, patient was admitted with a mechanical fall resulting in a scalp injury and right 12th rib fracture in the setting of alcohol use. BAL was 270 on admission. Her son reports that she drinks 2-3 bottles of wine daily. She started drinking 3 years ago after her husband of 35 years left her. She has a history of anxiety and dementia. 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 She is  receiving high dose thiamine for a few days. Home medications include Donepezil 10 mg qhs, Memantine 10 mg BID and Zoloft 150 mg daily. She is recommended for SNF placement for further intensive therapy services prior to returning home to improve her overall safety and independence with functional mobility. At this time, she is at increased risk for falls and has limited functional mobility.     On interview, [name] Fox is unable to recall why she is admitted to the hospital or what medical treatment she is receiving.  She is only oriented to self.  She responds to most questions with "I do not know" or "I am not sure."  She reports that her mood is okay.  She denies a history of depression or anxiety although records indicate that she has a history of anxiety.  She denies problems with sleep or appetite.  She denies SI, HI AVH.  Past Psychiatric History: Alcohol abuse, dementia and anxiety.   Risk to Self:  None. Denies SI.  Risk to Others:  None. Denies HI.  Prior Inpatient Therapy:  Denies  Prior Outpatient Therapy:  Her medications are managed by her outpatient provider.   Past Medical History:  Past Medical History:  Diagnosis Date  . Adenomatous colon polyp 05/26/1999   procedure at [facility] in [place]   . Anemia   . ANXIETY   . DEGENERATIVE JOINT DISEASE, KNEES, BILATERAL   . Dementia 08/31/2010   neuropsyc eval 10/2012 and follows with neuro  . Diverticulosis   . DYSLIPIDEMIA   . ETOH abuse   . Fibrocystic breast disease   . HEARING LOSS,  BILATERAL   . Hemorrhoids   . HERPES ZOSTER   . Hypertension   . Osteoporosis    DEXA A LB 09/22/14: -2.4 R fem, progressive decline since 2011    . PMR (polymyalgia rheumatica) (HCC) dx 10/2011   Initially dx GCA, then PMR clarified -on pred, follows with rheum  . VITAMIN B12 DEFICIENCY     Past Surgical History:  Procedure Laterality Date  . Bilateral Mastectomy     while removing cosmetic silicone  implants (NO Breast cancer)  . Bilateral silicone Breast explant  1993   Due to rupture/leak @ [facility]   Family History:  Family History  Problem Relation Age of Onset  . Pancreatic cancer Father   . Heart disease Brother   . Colon cancer Paternal Grandfather   . Colon cancer Paternal Aunt   . Inflammatory bowel disease Cousin    Family Psychiatric  History: Denies  Social History:  Social History   Substance and Sexual Activity  Alcohol Use Yes  . Alcohol/week: 3.0 standard drinks  . Types: 3 Glasses of wine per week   Comment: 02/2018 wine with dinner every night      Social History   Substance and Sexual Activity  Drug Use No    Social History   Socioeconomic History  . Marital status: Divorced    Spouse name: Not on file  . Number of children: Not on file  . Years of education: Not on file  . Highest education level: Not on file  Occupational History  . Not on file  Social Needs  . Financial resource strain: Not on file  . Food insecurity:    Worry: Not on file    Inability: Not on file  . Transportation needs:    Medical: Not on file    Non-medical: Not on file  Tobacco Use  . Smoking status: Never Smoker  . Smokeless tobacco: Never Used  Substance and Sexual Activity  . Alcohol use: Yes    Alcohol/week: 3.0 standard drinks    Types: 3 Glasses of wine per week    Comment: 02/2018 wine with dinner every night   . Drug use: No  . Sexual activity: Not Currently  Lifestyle  . Physical activity:    Days per week: Not on file    Minutes per session: Not on file  . Stress: Not on file  Relationships  . Social connections:    Talks on phone: Not on file    Gets together: Not on file    Attends religious service: Not on file    Active member of club or organization: Not on file    Attends meetings of clubs or organizations: Not on file    Relationship status: Not on file  Other Topics Concern  . Not on file  Social History Narrative   Divorced  fall 2015, lives alone. adopted son nearby. Retired from Public affairs consultant Social History: She lives at home alone. She has an adult son. She is a widow. She previously worked as a Regulatory affairs officer. She denies illicit substance use. She has a history of heavy alcohol use although she does not recall how much she drinks. Records indicate that she drinks 2-3 bottles of wine daily since her husband passed away 3 years ago.     Allergies:   Allergies  Allergen Reactions  . Penicillins Shortness Of Breath, Swelling and Rash  . Amlodipine Rash  . Sulfasalazine Rash    Labs:  Results  for orders placed or performed during the hospital encounter of 02/28/18 (from the past 48 hour(s))  CBC     Status: Abnormal   Collection Time: 03/01/18  4:47 AM  Result Value Ref Range   WBC 5.7 4.0 - 10.5 K/uL   RBC 2.92 (L) 3.87 - 5.11 MIL/uL   Hemoglobin 10.5 (L) 12.0 - 15.0 g/dL   HCT 30.6 (L) 36.0 - 46.0 %   MCV 104.8 (H) 78.0 - 100.0 fL   MCH 36.0 (H) 26.0 - 34.0 pg   MCHC 34.3 30.0 - 36.0 g/dL   RDW 13.5 11.5 - 15.5 %   Platelets 232 150 - 400 K/uL    Comment: Performed at [facility] [address], [place], [place] [address]  Comprehensive metabolic panel     Status: Abnormal   Collection Time: 03/01/18  4:47 AM  Result Value Ref Range   Sodium 134 (L) 135 - 145 mmol/L   Potassium 4.0 3.5 - 5.1 mmol/L   Chloride 99 98 - 111 mmol/L   CO2 26 22 - 32 mmol/L   Glucose, Bld 89 70 - 99 mg/dL   BUN 12 8 - 23 mg/dL   Creatinine, Ser 0.86 0.44 - 1.00 mg/dL   Calcium 8.4 (L) 8.9 - 10.3 mg/dL   Total Protein 5.5 (L) 6.5 - 8.1 g/dL   Albumin 3.2 (L) 3.5 - 5.0 g/dL   AST 35 15 - 41 U/L   ALT 28 0 - 44 U/L   Alkaline Phosphatase 58 38 - 126 U/L   Total Bilirubin 1.6 (H) 0.3 - 1.2 mg/dL   GFR calc non Af Amer >60 >60 mL/min   GFR calc Af Amer >60 >60 mL/min    Comment: (NOTE) The eGFR has been calculated using the CKD EPI equation. This calculation has not been validated in  all clinical situations. eGFR's persistently <60 mL/min signify possible Chronic Kidney Disease.    Anion gap 9 5 - 15    Comment: Performed at [facility] [address] [place], [place] [address]  Vitamin B12     Status: None   Collection Time: 03/01/18  2:08 PM  Result Value Ref Range   Vitamin B-12 631 180 - 914 pg/mL    Comment: (NOTE) This assay is not validated for testing neonatal or myeloproliferative syndrome specimens for Vitamin B12 levels. Performed at [facility], [place] [address] [place], [place] [address]   RPR     Status: None   Collection Time: 03/01/18  2:08 PM  Result Value Ref Range   RPR Ser Ql Non Reactive Non Reactive    Comment: (NOTE) Performed At: [facility] [place], [place] [id number] Rush Farmer MD TJ:[id number]     Current Facility-Administered Medications  Medication Dose Route Frequency Provider Last Rate Last Dose  . albuterol (PROVENTIL) (2.5 MG/3ML) 0.083% nebulizer solution 2.5 mg  2.5 mg Nebulization Q6H PRN Rondel Jumbo, PA-C      . bisacodyl (DULCOLAX) suppository 10 mg  10 mg Rectal Daily PRN Rondel Jumbo, PA-C      . calcium carbonate (OS-CAL - dosed in mg of elemental calcium) tablet 1,250 mg  1,250 mg Oral Daily Rondel Jumbo, PA-C   1,250 mg at 03/01/18 7622  . cholecalciferol (VITAMIN D) tablet 1,000 Units  1,000 Units Oral Daily Rondel Jumbo, PA-C   1,000 Units at 03/02/18 1119  . donepezil (ARICEPT) tablet 10 mg  10 mg  Oral QHS Rondel Jumbo, PA-C   10 mg at 03/01/18 2153  . doxycycline (VIBRA-TABS) tablet 100 mg  100 mg Oral Q12H Rondel Jumbo, PA-C   100 mg at 03/02/18 1120  . enoxaparin (LOVENOX) injection 40 mg  40 mg Subcutaneous Q24H Jonetta Osgood, MD   40 mg at 03/01/18 1713  . folic acid (FOLVITE) tablet 1 mg  1 mg Oral Daily Rondel Jumbo, PA-C   1 mg at 03/02/18 1119  . HYDROcodone-acetaminophen (NORCO/VICODIN) 5-325 MG per tablet 1-2 tablet  1-2 tablet Oral  Q4H PRN Rondel Jumbo, PA-C   1 tablet at 03/01/18 2153  . LORazepam (ATIVAN) tablet 1 mg  1 mg Oral Q6H PRN Rondel Jumbo, PA-C       Or  . LORazepam (ATIVAN) injection 1 mg  1 mg Intravenous Q6H PRN Rondel Jumbo, PA-C      . losartan (COZAAR) tablet 100 mg  100 mg Oral Daily Rondel Jumbo, PA-C   100 mg at 03/02/18 1119  . memantine (NAMENDA) tablet 10 mg  10 mg Oral BID Rondel Jumbo, PA-C   10 mg at 03/02/18 1119  . multivitamin with minerals tablet 1 tablet  1 tablet Oral Daily Rondel Jumbo, PA-C   1 tablet at 03/01/18 7939  . nebivolol (BYSTOLIC) tablet 10 mg  10 mg Oral Daily Rondel Jumbo, PA-C   10 mg at 03/02/18 1118  . ondansetron (ZOFRAN) tablet 4 mg  4 mg Oral Q6H PRN Rondel Jumbo, PA-C   4 mg at 03/02/18 0207   Or  . ondansetron (ZOFRAN) injection 4 mg  4 mg Intravenous Q6H PRN Rondel Jumbo, PA-C   4 mg at 02/28/18 1228  . pravastatin (PRAVACHOL) tablet 20 mg  20 mg Oral Daily Rondel Jumbo, PA-C   20 mg at 03/02/18 1119  . senna-docusate (Senokot-S) tablet 1 tablet  1 tablet Oral QHS PRN Rondel Jumbo, PA-C      . sertraline (ZOLOFT) tablet 150 mg  150 mg Oral Daily Rondel Jumbo, PA-C   150 mg at 03/02/18 1119  . thiamine 521m in normal saline (574m IVPB  500 mg Intravenous Q24H GhJonetta OsgoodMD   Stopped at 03/01/18 1746  . vitamin B-12 (CYANOCOBALAMIN) tablet 1,000 mcg  1,000 mcg Oral Daily WeRondel JumboPA-C   1,000 mcg at 03/02/18 1119    Musculoskeletal: Strength & Muscle Tone: decreased due to physical deconditioning.  Gait & Station: UTA since patient is lying in bed. Patient leans: N/A  Psychiatric Specialty Exam: Physical Exam  Nursing note and vitals reviewed. Constitutional: She appears well-developed and well-nourished.  HENT:  Head: Normocephalic and atraumatic.  Neck: Normal range of motion.  Respiratory: Effort normal.  Musculoskeletal: Normal range of motion.  Neurological: She is alert.  Only oriented to self.   Psychiatric: She has a normal mood and affect. Her speech is normal and behavior is normal. Judgment and thought content normal. Cognition and memory are impaired.    Review of Systems  Cardiovascular: Negative for chest pain.  Gastrointestinal: Negative for abdominal pain, constipation, diarrhea, nausea and vomiting.  Psychiatric/Behavioral: Positive for substance abuse. Negative for depression, hallucinations and suicidal ideas. The patient is not nervous/anxious and does not have insomnia.   All other systems reviewed and are negative.   Blood pressure 122/88, pulse (!) 54, temperature 97.8 F (36.6 C), temperature source Oral, resp. rate 17, height 5' 7"  (1.702 m),  weight 47.4 kg, SpO2 96 %.Body mass index is 16.37 kg/m.  General Appearance: Fairly Groomed, elderly, Caucasian female, wearing a hospital gown with short, gray hair who is lying in bed. NAD.   Eye Contact:  Good  Speech:  Clear and Coherent and Normal Rate  Volume:  Normal  Mood:  Euthymic  Affect:  Congruent  Thought Process:  Goal Directed, Linear and Descriptions of Associations: Intact  Orientation:  Other:  Only oriented to person.  Thought Content:  Logical  Suicidal Thoughts:  No  Homicidal Thoughts:  No  Memory:  Immediate;   Poor Recent;   Fair Remote;   Fair  Judgement:  Impaired  Insight:  Lacking  Psychomotor Activity:  Normal  Concentration:  Concentration: Good and Attention Span: Good  Recall:  Poor  Fund of Knowledge:  Poor  Language:  Fair  Akathisia:  No  Handed:  Right  AIMS (if indicated):   N/A  Assets:  Communication Skills Housing Social Support  ADL's:  Impaired  Cognition: Impaired. History of dementia.   Sleep:   Okay   Assessment:  [name] Fox is a 77 y.o. female who was admitted with a mechanical fall resulting in a scalp injury and right 12th rib fracture in the setting of alcohol use. She is unable to recall her medical condition and treatment that she is receiving in the  hospital. She lacks understanding of the need for SNF placement and therefore does not have capacity to refuse SNF placement.   Treatment Plan Summary: -Patient does not have capacity to refuse SNF placement.  -Psychiatry will sign off on patient at this time. Please consult psychiatry again as needed.   Disposition: No evidence of imminent risk to self or others at present.   Patient does not meet criteria for psychiatric inpatient admission.  Faythe Dingwall, DO 03/02/2018 11:46 AM

## 2018-03-02 NOTE — Telephone Encounter (Signed)
Copied from [employer] [id number]. Topic: Quick Communication - See Telephone Encounter >> Mar 02, 2018 10:51 AM Neva Seat wrote: Cynda Familia called to let Dr. Quay Burow know pt fell on Tues and is currently in the hospital [facility]. [place] cell:  [phone]

## 2018-03-02 NOTE — Plan of Care (Signed)

## 2018-03-02 NOTE — Social Work (Addendum)
CSW spoke with pt son at bedside, he has selected [facility]. He will go complete paperwork at 2pm at the facility. Pt PASRR still pending as is insurance authorization through [employer].  Clinicals have been faxed to [facility] as well as PASRR. Facility choice has been given to [facility] as well.    Pt son also provided HCPOA/POA/Advanced Directives which copies were placed on pt's hard shadow chart.   3:08pm- PASRR received: [phone] [place], [place] Work [phone]

## 2018-03-02 NOTE — Telephone Encounter (Signed)
I am aware  

## 2018-03-02 NOTE — Progress Notes (Signed)
PROGRESS NOTE        PATIENT DETAILS Name: [name] Fox Age: 77 y.o. Sex: female Date of Birth: 03-10-1941 Admit Date: 02/28/2018 Admitting Physician Karmen Bongo, MD PYK:DXIPJ, Claudina Lick, MD  Brief Narrative: Patient is a 77 y.o. female with history of hypertension, dyslipidemia, EtOH use presented with a mechanical fall with resultant scalp injury.  Alcohol level was 270 on admission.  See below for further details  Subjective: Lying comfortably in bed-no tremors.  Answers all my questions appropriately this morning compared to yesterday.  Assessment/Plan: Mechanical fall due to alcohol intoxication and a scalp laceration: Sutured in the emergency room-Per family/son at bedside she is up-to-date with tetanus vaccination.  Evaluated by PT, recommendations are for SNF on discharge.  Social worker following.   Alcohol abuse: Answers all my questions more fluently today, no tremors.  CIWA protocol, high-dose IV thiamine.  Last drink was on 8/14.  Per son, patient started drinking 3 years back when her husband of 35 years left her, she drinks approximately 1-3 bottles of wine on a daily basis.  Right-sided rib fracture: Pain controlled-continue supportive care, encourage incentive spirometry.    Right lung pneumonia: Could be aspiration pneumonia in the setting of alcohol abuse/intoxication, seems to be responding with doxycycline-continue for now.  She is afebrile and nontoxic-appearing.    Hypertension: Controlled, continue with losartan and Bystolic.   Dementia/depression: Continue Zoloft, Namenda and Aricept.  Per patient's son-patient has started drinking much more heavily since February of this year, as noted above, patient started drinking alcohol on a regular basis after her husband of 35 years left her 3 years back.  Patient apparently has a history of dementia and is on Namenda and Aricept.  Family is obviously very frustrated as patient continues to  drink-she is currently not very keen on going to SNF as recommended PT.  I had a long discussion with the patient's son at bedside (social work also present)-he apparently has healthcare power of attorney-if patient refused to go to SNF-Per patient's son he will really not force the issue.  Have placed a psych consultation not only to assess capacity, but also to see if medications need to be optimized further.  Vitamin B12 within normal limits, RPR negative.  TSH was within normal limits in November 2018.   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  DVT Prophylaxis: Prophylactic Lovenox   Code Status: Full code  Family Communication: Son at bedside  Disposition Plan: Remain inpatient-but will plan on Home health vs SNF on discharge-likely in the next day or so.  Antimicrobial agents: Anti-infectives (From admission, onward)   Start     Dose/Rate Route Frequency Ordered Stop   02/28/18 1000  doxycycline (VIBRA-TABS) tablet 100 mg     100 mg Oral Every 12 hours 02/28/18 0828     02/28/18 0700  doxycycline (VIBRA-TABS) tablet 100 mg     100 mg Oral  Once 02/28/18 8250 02/28/18 0657      Procedures: None  CONSULTS:  psychiatry  Time spent: 25- minutes-Greater than 50% of this time was spent in counseling, explanation of diagnosis, planning of further management, and coordination of care.  MEDICATIONS: Scheduled Meds: . calcium carbonate  1,250 mg Oral Daily  . cholecalciferol  1,000 Units Oral Daily  . donepezil  10 mg Oral QHS  . doxycycline  100 mg Oral Q12H  . enoxaparin (LOVENOX) injection  40 mg Subcutaneous Q24H  . folic acid  1 mg Oral Daily  . losartan  100 mg Oral Daily  . memantine  10 mg Oral BID  . multivitamin with minerals  1 tablet Oral Daily  . nebivolol  10 mg Oral Daily  . pravastatin  20 mg Oral Daily  . sertraline  150 mg Oral Daily  . vitamin B-12  1,000 mcg Oral Daily    Continuous Infusions: . thiamine injection Stopped (03/01/18 1746)   PRN Meds:.albuterol, bisacodyl, HYDROcodone-acetaminophen, LORazepam **OR** LORazepam, ondansetron **OR** ondansetron (ZOFRAN) IV, senna-docusate   PHYSICAL EXAM: Vital signs: Vitals:   03/01/18 0900 03/01/18 1344 03/01/18 2059 03/02/18 0554  BP:  129/61 (!) 143/57 122/88  Pulse:  (!) 53 (!) 52 (!) 54  Resp:  16 16 17   Temp:  (!) 97.5 F (36.4 C) 98.2 F (36.8 C) 97.8 F (36.6 C)  TempSrc:  Oral Oral Oral  SpO2:  97% 96% 96%  Weight:      Height: 5\' 7"  (1.702 m)      Filed Weights   03/01/18 0500  Weight: 47.4 kg   Body mass index is 16.37 kg/m.   General appearance:Awake, alert, not in any distress.  Eyes:no scleral icterus. HEENT: Atraumatic and Normocephalic Neck: supple, no JVD. Resp:Good air entry bilaterally,no rales or rhonchi CVS: S1 S2 regular, no murmurs.  GI: Bowel sounds present, Non tender and not distended with no gaurding, rigidity or rebound. Extremities: B/L Lower Ext shows no edema, both legs are warm to touch Neurology:  Non focal Psychiatric: Normal judgment and insight. Normal mood. Musculoskeletal:No digital cyanosis Skin:No Rash, warm and dry Wounds:N/A  I have personally reviewed following labs and imaging studies  LABORATORY DATA: CBC: Recent Labs  Lab 02/28/18 0434 03/01/18 0447  WBC 9.9 5.7  NEUTROABS 8.1*  --   HGB 13.0 10.5*  HCT 37.5 30.6*  MCV 103.3* 104.8*  PLT 260 063    Basic Metabolic Panel: Recent Labs  Lab 02/28/18 0434 03/01/18 0447  NA 133* 134*  K 3.5 4.0  CL 95* 99  CO2 23 26  GLUCOSE 86 89  BUN 9 12  CREATININE 0.64 0.86  CALCIUM 8.8* 8.4*    GFR: Estimated Creatinine Clearance: 42.3 mL/min (by C-G formula based on SCr of 0.86 mg/dL).  Liver Function Tests: Recent Labs  Lab 02/28/18 0434 03/01/18 0447  AST 61* 35  ALT 41 28  ALKPHOS 75 58  BILITOT 0.7 1.6*  PROT 7.1 5.5*  ALBUMIN 4.0 3.2*   No results for input(s):  LIPASE, AMYLASE in the last 168 hours. No results for input(s): AMMONIA in the last 168 hours.  Coagulation Profile: No results for input(s): INR, PROTIME in the last 168 hours.  Cardiac Enzymes: Recent Labs  Lab 02/28/18 0434  CKTOTAL 98    BNP (last 3 results) No results for input(s): PROBNP in the last 8760 hours.  HbA1C: No results for input(s): HGBA1C in the last 72 hours.  CBG: No results for input(s): GLUCAP in the last 168 hours.  Lipid Profile: No results for input(s): CHOL, HDL, LDLCALC, TRIG, CHOLHDL, LDLDIRECT in the last 72 hours.  Thyroid Function Tests: No results for input(s): TSH, T4TOTAL, FREET4, T3FREE, THYROIDAB in the last 72 hours.  Anemia Panel: Recent Labs    03/01/18 1408  VITAMINB12 631    Urine analysis:    Component Value Date/Time  COLORURINE STRAW (A) 02/28/2018 0446   APPEARANCEUR CLEAR 02/28/2018 0446   LABSPEC 1.005 02/28/2018 0446   PHURINE 5.0 02/28/2018 0446   GLUCOSEU NEGATIVE 02/28/2018 0446   GLUCOSEU NEGATIVE 06/17/2014 1301   HGBUR MODERATE (A) 02/28/2018 0446   HGBUR moderate 04/09/2010 1326   BILIRUBINUR NEGATIVE 02/28/2018 0446   BILIRUBINUR neg 04/01/2011 1137   KETONESUR 5 (A) 02/28/2018 0446   PROTEINUR NEGATIVE 02/28/2018 0446   UROBILINOGEN 0.2 06/17/2014 1301   NITRITE NEGATIVE 02/28/2018 0446   LEUKOCYTESUR NEGATIVE 02/28/2018 0446    Sepsis Labs: Lactic Acid, Venous No results found for: LATICACIDVEN  MICROBIOLOGY: No results found for this or any previous visit (from the past 240 hour(s)).  RADIOLOGY STUDIES/RESULTS: Ct Head Wo Contrast  Result Date: 02/28/2018 CLINICAL DATA:  Fall striking head against the base port. No loss of consciousness. Scalp laceration to the top of the head. EXAM: CT HEAD WITHOUT CONTRAST CT CERVICAL SPINE WITHOUT CONTRAST TECHNIQUE: Multidetector CT imaging of the head and cervical spine was performed following the standard protocol without intravenous contrast.  Multiplanar CT image reconstructions of the cervical spine were also generated. COMPARISON:  MRI brain 11/06/2011 FINDINGS: CT HEAD FINDINGS Brain: No evidence of acute infarction, hemorrhage, hydrocephalus, extra-axial collection or mass lesion/mass effect. Diffuse cerebral atrophy. Ventricular dilatation consistent with central atrophy. Low-attenuation changes in the deep white matter consistent with small vessel ischemia. Vascular: No hyperdense vessel or unexpected calcification. Skull: Calvarium appears intact. Sinuses/Orbits: Mucosal thickening in the paranasal sinuses. No acute air-fluid levels. Mastoid air cells are clear. Other: None. CT CERVICAL SPINE FINDINGS Alignment: Normal. Skull base and vertebrae: No acute fracture. No primary bone lesion or focal pathologic process. Old appearing ununited ossicles at the anterior foramen magnum and clivus. Soft tissues and spinal canal: No prevertebral fluid or swelling. No visible canal hematoma. Disc levels: Degenerative changes in the cervical spine with narrowed disc spaces and endplate hypertrophic changes most prominent at C5-6 and C6-7 levels. Degenerative changes in the facet joints. Upper chest: Lung apices are clear. Other: Left thyroid gland nodule measuring 12 mm. IMPRESSION: 1. No acute intracranial abnormalities. Chronic atrophy and small vessel ischemic changes. 2. Normal alignment of the cervical spine. Degenerative changes. No acute displaced fractures identified. 3. 12 mm left thyroid gland nodule. Based on lesion size and patient age, no further evaluation is indicated. Electronically Signed   By: Lucienne Capers M.D.   On: 02/28/2018 05:57   Ct Chest Wo Contrast  Result Date: 02/28/2018 CLINICAL DATA:  Patient lost balance and fell this morning. Suspicion of rib fractures. EXAM: CT CHEST WITHOUT CONTRAST TECHNIQUE: Multidetector CT imaging of the chest was performed following the standard protocol without IV contrast. COMPARISON:   09/25/2007 FINDINGS: Cardiovascular: Normal heart size. No pericardial effusion. Coronary artery calcifications. Dilated ascending thoracic aorta measuring 4.4 cm diameter. Scattered aortic calcifications. Mediastinum/Nodes: No significant lymphadenopathy in the chest. Esophagus is decompressed. 12 mm left thyroid gland nodule. Lungs/Pleura: Pulmonary hyperinflation. Peribronchial thickening with peribronchial streaky infiltrates in the right lung base. Focal consolidation peripherally in the right lower lung at the costophrenic angle. Appearance suggest bronchopneumonia. Left lung is clear. No pleural effusions. No pneumothorax. Upper Abdomen: No acute changes identified. Musculoskeletal: Nondisplaced fracture of the right posterior 12 and eleventh ribs at the costovertebral junction. Normal alignment of the thoracic spine. No vertebral compression deformities. No depressed sternal fractures. IMPRESSION: 1. Nondisplaced fracture of the right posterior 12 and eleventh ribs at the costovertebral junction. 2. Peribronchial thickening with peribronchial infiltrates in the  right lung base consistent with bronchopneumonia. 3. Dilated ascending thoracic aorta measuring 4.4 cm diameter. Recommend annual imaging followup by CTA or MRA. This recommendation follows 2010 ACCF/AHA/AATS/ACR/ASA/SCA/SCAI/SIR/STS/SVM Guidelines for the Diagnosis and Management of Patients with Thoracic Aortic Disease. Circulation. 2010; 121: J030-S923. Aortic Atherosclerosis (ICD10-I70.0) and Emphysema (ICD10-J43.9). Electronically Signed   By: Lucienne Capers M.D.   On: 02/28/2018 06:02   Ct Cervical Spine Wo Contrast  Result Date: 02/28/2018 CLINICAL DATA:  Fall striking head against the base port. No loss of consciousness. Scalp laceration to the top of the head. EXAM: CT HEAD WITHOUT CONTRAST CT CERVICAL SPINE WITHOUT CONTRAST TECHNIQUE: Multidetector CT imaging of the head and cervical spine was performed following the standard protocol  without intravenous contrast. Multiplanar CT image reconstructions of the cervical spine were also generated. COMPARISON:  MRI brain 11/06/2011 FINDINGS: CT HEAD FINDINGS Brain: No evidence of acute infarction, hemorrhage, hydrocephalus, extra-axial collection or mass lesion/mass effect. Diffuse cerebral atrophy. Ventricular dilatation consistent with central atrophy. Low-attenuation changes in the deep white matter consistent with small vessel ischemia. Vascular: No hyperdense vessel or unexpected calcification. Skull: Calvarium appears intact. Sinuses/Orbits: Mucosal thickening in the paranasal sinuses. No acute air-fluid levels. Mastoid air cells are clear. Other: None. CT CERVICAL SPINE FINDINGS Alignment: Normal. Skull base and vertebrae: No acute fracture. No primary bone lesion or focal pathologic process. Old appearing ununited ossicles at the anterior foramen magnum and clivus. Soft tissues and spinal canal: No prevertebral fluid or swelling. No visible canal hematoma. Disc levels: Degenerative changes in the cervical spine with narrowed disc spaces and endplate hypertrophic changes most prominent at C5-6 and C6-7 levels. Degenerative changes in the facet joints. Upper chest: Lung apices are clear. Other: Left thyroid gland nodule measuring 12 mm. IMPRESSION: 1. No acute intracranial abnormalities. Chronic atrophy and small vessel ischemic changes. 2. Normal alignment of the cervical spine. Degenerative changes. No acute displaced fractures identified. 3. 12 mm left thyroid gland nodule. Based on lesion size and patient age, no further evaluation is indicated. Electronically Signed   By: Lucienne Capers M.D.   On: 02/28/2018 05:57     LOS: 0 days   Oren Binet, MD  [facility]  If 7PM-7AM, please contact night-coverage  Please page via [email or web address] [email or web address]-click on MD name and type text message  03/02/2018, 12:14 PM

## 2018-03-03 DIAGNOSIS — F1010 Alcohol abuse, uncomplicated: Secondary | ICD-10-CM | POA: Diagnosis not present

## 2018-03-03 DIAGNOSIS — I77810 Thoracic aortic ectasia: Secondary | ICD-10-CM | POA: Diagnosis not present

## 2018-03-03 DIAGNOSIS — S2241XA Multiple fractures of ribs, right side, initial encounter for closed fracture: Secondary | ICD-10-CM | POA: Diagnosis not present

## 2018-03-03 DIAGNOSIS — J180 Bronchopneumonia, unspecified organism: Secondary | ICD-10-CM | POA: Diagnosis not present

## 2018-03-03 NOTE — Plan of Care (Signed)

## 2018-03-03 NOTE — Social Work (Signed)
CSW did not receive determination from [facility] regarding pt authorization for SNF, [place] offices are closed on weekends. Will speak with RN Case Manager regarding this update.  Alexander [place], [place] Work [phone]

## 2018-03-03 NOTE — Progress Notes (Signed)
PROGRESS NOTE        PATIENT DETAILS Name: [name] Fox Age: 77 y.o. Sex: female Date of Birth: [birth year] Admit Date: 02/28/2018 Admitting Physician Karmen Bongo, MD RJJ:OACZY, Claudina Lick, MD  Brief Narrative: Patient is a 77 y.o. female with history of hypertension, dyslipidemia, EtOH use presented with a mechanical fall with resultant scalp injury.  Alcohol level was 270 on admission.  See below for further details  Subjective: Lying comfortably in bed.  Follows all my commands.  No chest pain or shortness of breath.  Assessment/Plan: Mechanical fall due to alcohol intoxication and a scalp laceration: Sutured in the emergency room-Per family/son at bedside she is up-to-date with tetanus vaccination.  Evaluated by PT, recommendations are for SNF on discharge.  Social worker following.   Alcohol abuse: No tremors, somewhat confused compared to yesterday.  Needs a lot more redirection this morning.  Continue Ativan per protocol.  Last drink on 8/14.  Per son, patient started drinking 3 years back when her husband of 35 years left her, she drinks approximately 1-3 bottles of wine on a daily basis.  Right-sided rib fracture: Continue supportive care, encourage incentive spirometry.  Follow.     Right lung pneumonia: Could be aspiration pneumonia in the setting of alcohol abuse, continue doxycycline-she seems to be responding, and do not think we need to add anaerobic coverage at this point.  She is afebrile and nontoxic-appearing.    Hypertension: Controlled, continue losartan and Bystolic  Dementia/depression: Continue Zoloft, Namenda and Aricept.  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  Mentation not any better-suspect this is a baseline at this point-she is somewhat confused and needs a quick redirection follow where she answers questions appropriately most of the time.She does have a  history of dementia at baseline.  Vitamin B12 normal limits, TSH in November 2018 was within normal limits, RPR negative.Marland Kitchen  Appreciate psych input, patient does not have capacity to refuse SNF placement.  Patient's son has healthcare power of attorney.  And RPR within appreciate psychiatry input, patient does not have capacity to make medical decisions.   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  DVT Prophylaxis: Prophylactic Lovenox   Code Status: Full code  Family Communication: Son at bedside  Disposition Plan: Remain inpatient-SNF early next week when insurance authorization comes in.  Antimicrobial agents: Anti-infectives (From admission, onward)   Start     Dose/Rate Route Frequency Ordered Stop   02/28/18 1000  doxycycline (VIBRA-TABS) tablet 100 mg     100 mg Oral Every 12 hours 02/28/18 0828     02/28/18 0700  doxycycline (VIBRA-TABS) tablet 100 mg     100 mg Oral  Once 02/28/18 6063 02/28/18 0657      Procedures: None  CONSULTS:  psychiatry  Time spent: 25- minutes-Greater than 50% of this time was spent in counseling, explanation of diagnosis, planning of further management, and coordination of care.  MEDICATIONS: Scheduled Meds: . calcium carbonate  1,250 mg Oral Daily  . cholecalciferol  1,000 Units Oral Daily  . donepezil  10 mg Oral QHS  . doxycycline  100 mg Oral Q12H  . enoxaparin (LOVENOX) injection  40 mg Subcutaneous Q24H  . folic acid  1 mg Oral Daily  .  losartan  100 mg Oral Daily  . memantine  10 mg Oral BID  . multivitamin with minerals  1 tablet Oral Daily  . nebivolol  10 mg Oral Daily  . pravastatin  20 mg Oral Daily  . sertraline  150 mg Oral Daily  . vitamin B-12  1,000 mcg Oral Daily   Continuous Infusions: . thiamine injection 500 mg (03/02/18 1549)   PRN Meds:.albuterol, bisacodyl, HYDROcodone-acetaminophen, ondansetron **OR** ondansetron (ZOFRAN) IV,  senna-docusate   PHYSICAL EXAM: Vital signs: Vitals:   03/02/18 0554 03/02/18 1417 03/02/18 2128 03/03/18 0602  BP: 122/88 128/62 (!) 161/60 (!) 151/67  Pulse: (!) 54 (!) 57 (!) 48 (!) 43  Resp: 17 16 17 17   Temp: 97.8 F (36.6 C) 99 F (37.2 C) 98.3 F (36.8 C) 98.1 F (36.7 C)  TempSrc: Oral Oral Oral Oral  SpO2: 96% 97% 96% 97%  Weight:      Height:       Filed Weights   03/01/18 0500  Weight: 47.4 kg   Body mass index is 16.37 kg/m.   General appearance:Awake, slightly confused but easily redirectable.  Not in any distress.   Eyes:no scleral icterus. HEENT: Atraumatic and Normocephalic Neck: supple, no JVD. Resp:Good air entry bilaterally,no rales or rhonchi CVS: S1 S2 regular, no murmurs.  GI: Bowel sounds present, Non tender and not distended with no gaurding, rigidity or rebound. Extremities: B/L Lower Ext shows no edema, both legs are warm to touch Neurology:  Non focal Musculoskeletal:No digital cyanosis Skin:No Rash, warm and dry Wounds:N/A  I have personally reviewed following labs and imaging studies  LABORATORY DATA: CBC: Recent Labs  Lab 02/28/18 0434 03/01/18 0447  WBC 9.9 5.7  NEUTROABS 8.1*  --   HGB 13.0 10.5*  HCT 37.5 30.6*  MCV 103.3* 104.8*  PLT 260 585    Basic Metabolic Panel: Recent Labs  Lab 02/28/18 0434 03/01/18 0447  NA 133* 134*  K 3.5 4.0  CL 95* 99  CO2 23 26  GLUCOSE 86 89  BUN 9 12  CREATININE 0.64 0.86  CALCIUM 8.8* 8.4*    GFR: Estimated Creatinine Clearance: 42.3 mL/min (by C-G formula based on SCr of 0.86 mg/dL).  Liver Function Tests: Recent Labs  Lab 02/28/18 0434 03/01/18 0447  AST 61* 35  ALT 41 28  ALKPHOS 75 58  BILITOT 0.7 1.6*  PROT 7.1 5.5*  ALBUMIN 4.0 3.2*   No results for input(s): LIPASE, AMYLASE in the last 168 hours. No results for input(s): AMMONIA in the last 168 hours.  Coagulation Profile: No results for input(s): INR, PROTIME in the last 168 hours.  Cardiac  Enzymes: Recent Labs  Lab 02/28/18 0434  CKTOTAL 98    BNP (last 3 results) No results for input(s): PROBNP in the last 8760 hours.  HbA1C: No results for input(s): HGBA1C in the last 72 hours.  CBG: No results for input(s): GLUCAP in the last 168 hours.  Lipid Profile: No results for input(s): CHOL, HDL, LDLCALC, TRIG, CHOLHDL, LDLDIRECT in the last 72 hours.  Thyroid Function Tests: No results for input(s): TSH, T4TOTAL, FREET4, T3FREE, THYROIDAB in the last 72 hours.  Anemia Panel: Recent Labs    03/01/18 1408  VITAMINB12 631    Urine analysis:    Component Value Date/Time   COLORURINE STRAW (A) 02/28/2018 0446   APPEARANCEUR CLEAR 02/28/2018 0446   LABSPEC 1.005 02/28/2018 0446   PHURINE 5.0 02/28/2018 0446   GLUCOSEU NEGATIVE 02/28/2018 0446   GLUCOSEU NEGATIVE  06/17/2014 1301   HGBUR MODERATE (A) 02/28/2018 0446   HGBUR moderate 04/09/2010 1326   BILIRUBINUR NEGATIVE 02/28/2018 0446   BILIRUBINUR neg 04/01/2011 1137   KETONESUR 5 (A) 02/28/2018 0446   PROTEINUR NEGATIVE 02/28/2018 0446   UROBILINOGEN 0.2 06/17/2014 1301   NITRITE NEGATIVE 02/28/2018 0446   LEUKOCYTESUR NEGATIVE 02/28/2018 0446    Sepsis Labs: Lactic Acid, Venous No results found for: LATICACIDVEN  MICROBIOLOGY: No results found for this or any previous visit (from the past 240 hour(s)).  RADIOLOGY STUDIES/RESULTS: Ct Head Wo Contrast  Result Date: 02/28/2018 CLINICAL DATA:  Fall striking head against the base port. No loss of consciousness. Scalp laceration to the top of the head. EXAM: CT HEAD WITHOUT CONTRAST CT CERVICAL SPINE WITHOUT CONTRAST TECHNIQUE: Multidetector CT imaging of the head and cervical spine was performed following the standard protocol without intravenous contrast. Multiplanar CT image reconstructions of the cervical spine were also generated. COMPARISON:  MRI brain 11/06/2011 FINDINGS: CT HEAD FINDINGS Brain: No evidence of acute infarction, hemorrhage,  hydrocephalus, extra-axial collection or mass lesion/mass effect. Diffuse cerebral atrophy. Ventricular dilatation consistent with central atrophy. Low-attenuation changes in the deep white matter consistent with small vessel ischemia. Vascular: No hyperdense vessel or unexpected calcification. Skull: Calvarium appears intact. Sinuses/Orbits: Mucosal thickening in the paranasal sinuses. No acute air-fluid levels. Mastoid air cells are clear. Other: None. CT CERVICAL SPINE FINDINGS Alignment: Normal. Skull base and vertebrae: No acute fracture. No primary bone lesion or focal pathologic process. Old appearing ununited ossicles at the anterior foramen magnum and clivus. Soft tissues and spinal canal: No prevertebral fluid or swelling. No visible canal hematoma. Disc levels: Degenerative changes in the cervical spine with narrowed disc spaces and endplate hypertrophic changes most prominent at C5-6 and C6-7 levels. Degenerative changes in the facet joints. Upper chest: Lung apices are clear. Other: Left thyroid gland nodule measuring 12 mm. IMPRESSION: 1. No acute intracranial abnormalities. Chronic atrophy and small vessel ischemic changes. 2. Normal alignment of the cervical spine. Degenerative changes. No acute displaced fractures identified. 3. 12 mm left thyroid gland nodule. Based on lesion size and patient age, no further evaluation is indicated. Electronically Signed   By: Lucienne Capers M.D.   On: 02/28/2018 05:57   Ct Chest Wo Contrast  Result Date: 02/28/2018 CLINICAL DATA:  Patient lost balance and fell this morning. Suspicion of rib fractures. EXAM: CT CHEST WITHOUT CONTRAST TECHNIQUE: Multidetector CT imaging of the chest was performed following the standard protocol without IV contrast. COMPARISON:  09/25/2007 FINDINGS: Cardiovascular: Normal heart size. No pericardial effusion. Coronary artery calcifications. Dilated ascending thoracic aorta measuring 4.4 cm diameter. Scattered aortic  calcifications. Mediastinum/Nodes: No significant lymphadenopathy in the chest. Esophagus is decompressed. 12 mm left thyroid gland nodule. Lungs/Pleura: Pulmonary hyperinflation. Peribronchial thickening with peribronchial streaky infiltrates in the right lung base. Focal consolidation peripherally in the right lower lung at the costophrenic angle. Appearance suggest bronchopneumonia. Left lung is clear. No pleural effusions. No pneumothorax. Upper Abdomen: No acute changes identified. Musculoskeletal: Nondisplaced fracture of the right posterior 12 and eleventh ribs at the costovertebral junction. Normal alignment of the thoracic spine. No vertebral compression deformities. No depressed sternal fractures. IMPRESSION: 1. Nondisplaced fracture of the right posterior 12 and eleventh ribs at the costovertebral junction. 2. Peribronchial thickening with peribronchial infiltrates in the right lung base consistent with bronchopneumonia. 3. Dilated ascending thoracic aorta measuring 4.4 cm diameter. Recommend annual imaging followup by CTA or MRA. This recommendation follows 2010 ACCF/AHA/AATS/ACR/ASA/SCA/SCAI/SIR/STS/SVM Guidelines for the Diagnosis and  Management of Patients with Thoracic Aortic Disease. Circulation. 2010; 121: M094-B096. Aortic Atherosclerosis (ICD10-I70.0) and Emphysema (ICD10-J43.9). Electronically Signed   By: Lucienne Capers M.D.   On: 02/28/2018 06:02   Ct Cervical Spine Wo Contrast  Result Date: 02/28/2018 CLINICAL DATA:  Fall striking head against the base port. No loss of consciousness. Scalp laceration to the top of the head. EXAM: CT HEAD WITHOUT CONTRAST CT CERVICAL SPINE WITHOUT CONTRAST TECHNIQUE: Multidetector CT imaging of the head and cervical spine was performed following the standard protocol without intravenous contrast. Multiplanar CT image reconstructions of the cervical spine were also generated. COMPARISON:  MRI brain 11/06/2011 FINDINGS: CT HEAD FINDINGS Brain: No evidence  of acute infarction, hemorrhage, hydrocephalus, extra-axial collection or mass lesion/mass effect. Diffuse cerebral atrophy. Ventricular dilatation consistent with central atrophy. Low-attenuation changes in the deep white matter consistent with small vessel ischemia. Vascular: No hyperdense vessel or unexpected calcification. Skull: Calvarium appears intact. Sinuses/Orbits: Mucosal thickening in the paranasal sinuses. No acute air-fluid levels. Mastoid air cells are clear. Other: None. CT CERVICAL SPINE FINDINGS Alignment: Normal. Skull base and vertebrae: No acute fracture. No primary bone lesion or focal pathologic process. Old appearing ununited ossicles at the anterior foramen magnum and clivus. Soft tissues and spinal canal: No prevertebral fluid or swelling. No visible canal hematoma. Disc levels: Degenerative changes in the cervical spine with narrowed disc spaces and endplate hypertrophic changes most prominent at C5-6 and C6-7 levels. Degenerative changes in the facet joints. Upper chest: Lung apices are clear. Other: Left thyroid gland nodule measuring 12 mm. IMPRESSION: 1. No acute intracranial abnormalities. Chronic atrophy and small vessel ischemic changes. 2. Normal alignment of the cervical spine. Degenerative changes. No acute displaced fractures identified. 3. 12 mm left thyroid gland nodule. Based on lesion size and patient age, no further evaluation is indicated. Electronically Signed   By: Lucienne Capers M.D.   On: 02/28/2018 05:57     LOS: 0 days   Oren Binet, MD  [facility]  If 7PM-7AM, please contact night-coverage  Please page via [email or web address]-click on MD name and type text message  03/03/2018, 12:54 PM

## 2018-03-04 DIAGNOSIS — F1010 Alcohol abuse, uncomplicated: Secondary | ICD-10-CM | POA: Diagnosis not present

## 2018-03-04 DIAGNOSIS — S0101XA Laceration without foreign body of scalp, initial encounter: Secondary | ICD-10-CM | POA: Diagnosis not present

## 2018-03-04 DIAGNOSIS — S2241XA Multiple fractures of ribs, right side, initial encounter for closed fracture: Secondary | ICD-10-CM | POA: Diagnosis not present

## 2018-03-04 DIAGNOSIS — I77810 Thoracic aortic ectasia: Secondary | ICD-10-CM | POA: Diagnosis not present

## 2018-03-04 MED ORDER — VITAMIN B-1 100 MG PO TABS
100.0000 mg | ORAL_TABLET | Freq: Every day | ORAL | Status: DC
  Administered 2018-03-04 – 2018-03-05 (×2): 100 mg via ORAL
  Filled 2018-03-04 (×2): qty 1

## 2018-03-04 NOTE — Progress Notes (Signed)
PROGRESS NOTE        PATIENT DETAILS Name: [name] Fox Age: 77 y.o. Sex: female Date of Birth: [birth year] Admit Date: 02/28/2018 Admitting Physician Karmen Bongo, MD JYN:WGNFA, Claudina Lick, MD  Brief Narrative: Patient is a 77 y.o. female with history of hypertension, dyslipidemia, EtOH use, dementia presented with a mechanical fall with resultant scalp injury.  Alcohol level was 270 on admission.  Admitted to the hospital service for supportive care.  Seen by psychiatry, not felt to have capacity.  Currently awaiting SNF bed.  See below for further details  Subjective: Sitting up in chair at bedside-mildly confused but follow most of my commands.  Assessment/Plan: Mechanical fall due to alcohol intoxication and a scalp laceration: Laceration sutured in the emergency room, person patient up-to-date with tetanus vaccination.  Evaluated by PT, with recommendations to transfer to [employer] authorization.  Social worker following.  Alcohol abuse: No tremors, pleasantly confused-suspect she is at baseline (history of dementia) last drink was on 8/14.  Remains on Ativan per protocol.  Per son, patient started drinking 3 years back when her husband of 35 years left her, she drinks approximately 1-3 bottles of wine on a daily basis.  Right-sided rib fracture: Continue supportive care-encourage incentive spirometry.    Right lung pneumonia: Could be aspiration pneumonia in the setting of alcohol abuse, continue doxycycline-she seems to be responding, and do not think we need to add anaerobic coverage at this point.  She is afebrile and nontoxic-appearing.    Hypertension: Controlled, continue losartan and Bystolic  Dementia/depression: Continue Zoloft, Namenda and Aricept.  Pleasantly confused at times, suspect she is back to usual baseline.  Vitamin B12 within normal limits, TSH in November 2018 was normal.  RPR negative.  Completed high dose of IV thiamine in  case she had Korsakoff's-but no improvement in mental status, did not have any eye signs.  Evaluated by psychiatry, patient does not have capacity to refuse SNF placement.  Patient's son has healthcare power of attorney.  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  DVT Prophylaxis: Prophylactic Lovenox   Code Status: Full code  Family Communication: None at bedside  Disposition Plan: Remain inpatient-SNF early next week when insurance authorization comes in.  Antimicrobial agents: Anti-infectives (From admission, onward)   Start     Dose/Rate Route Frequency Ordered Stop   02/28/18 1000  doxycycline (VIBRA-TABS) tablet 100 mg     100 mg Oral Every 12 hours 02/28/18 0828     02/28/18 0700  doxycycline (VIBRA-TABS) tablet 100 mg     100 mg Oral  Once 02/28/18 2130 02/28/18 0657      Procedures: None  CONSULTS:  psychiatry  Time spent: 25- minutes-Greater than 50% of this time was spent in counseling, explanation of diagnosis, planning of further management, and coordination of care.  MEDICATIONS: Scheduled Meds: . calcium carbonate  1,250 mg Oral Daily  . cholecalciferol  1,000 Units Oral Daily  . donepezil  10 mg Oral QHS  . doxycycline  100 mg Oral Q12H  . enoxaparin (LOVENOX) injection  40 mg Subcutaneous Q24H  . folic acid  1 mg Oral Daily  . losartan  100 mg Oral Daily  . memantine  10 mg Oral BID  . multivitamin with minerals  1 tablet Oral Daily  .  nebivolol  10 mg Oral Daily  . pravastatin  20 mg Oral Daily  . sertraline  150 mg Oral Daily  . vitamin B-12  1,000 mcg Oral Daily   Continuous Infusions:  PRN Meds:.albuterol, bisacodyl, HYDROcodone-acetaminophen, ondansetron **OR** ondansetron (ZOFRAN) IV, senna-docusate   PHYSICAL EXAM: Vital signs: Vitals:   03/03/18 0602 03/03/18 1419 03/03/18 2111 03/04/18 0537  BP: (!) 151/67 (!) 146/64 (!) 151/57 (!) 176/62  Pulse: (!) 43  (!) 49 (!) 53 (!) 59  Resp: 17 18 16 14   Temp: 98.1 F (36.7 C) 98.1 F (36.7 C) 98.5 F (36.9 C) (!) 97.5 F (36.4 C)  TempSrc: Oral Oral Oral Oral  SpO2: 97% 98% 96% 98%  Weight:      Height:       Filed Weights   03/01/18 0500  Weight: 47.4 kg   Body mass index is 16.37 kg/m.   General appearance:Awake, pleasantly confused but easily redirectable. Eyes:no scleral icterus. HEENT: Atraumatic and Normocephalic Neck: supple, no JVD. Resp:Good air entry bilaterally,no rales or rhonchi CVS: S1 S2 regular, no murmurs.  GI: Bowel sounds present, Non tender and not distended with no gaurding, rigidity or rebound. Extremities: B/L Lower Ext shows no edema, both legs are warm to touch Neurology:  Non focal Psychiatric: Normal judgment and insight. Normal mood. Musculoskeletal:No digital cyanosis Skin:No Rash, warm and dry Wounds:N/A  I have personally reviewed following labs and imaging studies  LABORATORY DATA: CBC: Recent Labs  Lab 02/28/18 0434 03/01/18 0447  WBC 9.9 5.7  NEUTROABS 8.1*  --   HGB 13.0 10.5*  HCT 37.5 30.6*  MCV 103.3* 104.8*  PLT 260 389    Basic Metabolic Panel: Recent Labs  Lab 02/28/18 0434 03/01/18 0447  NA 133* 134*  K 3.5 4.0  CL 95* 99  CO2 23 26  GLUCOSE 86 89  BUN 9 12  CREATININE 0.64 0.86  CALCIUM 8.8* 8.4*    GFR: Estimated Creatinine Clearance: 42.3 mL/min (by C-G formula based on SCr of 0.86 mg/dL).  Liver Function Tests: Recent Labs  Lab 02/28/18 0434 03/01/18 0447  AST 61* 35  ALT 41 28  ALKPHOS 75 58  BILITOT 0.7 1.6*  PROT 7.1 5.5*  ALBUMIN 4.0 3.2*   No results for input(s): LIPASE, AMYLASE in the last 168 hours. No results for input(s): AMMONIA in the last 168 hours.  Coagulation Profile: No results for input(s): INR, PROTIME in the last 168 hours.  Cardiac Enzymes: Recent Labs  Lab 02/28/18 0434  CKTOTAL 98    BNP (last 3 results) No results for input(s): PROBNP in the last 8760  hours.  HbA1C: No results for input(s): HGBA1C in the last 72 hours.  CBG: No results for input(s): GLUCAP in the last 168 hours.  Lipid Profile: No results for input(s): CHOL, HDL, LDLCALC, TRIG, CHOLHDL, LDLDIRECT in the last 72 hours.  Thyroid Function Tests: No results for input(s): TSH, T4TOTAL, FREET4, T3FREE, THYROIDAB in the last 72 hours.  Anemia Panel: Recent Labs    03/01/18 1408  VITAMINB12 631    Urine analysis:    Component Value Date/Time   COLORURINE STRAW (A) 02/28/2018 0446   APPEARANCEUR CLEAR 02/28/2018 0446   LABSPEC 1.005 02/28/2018 0446   PHURINE 5.0 02/28/2018 0446   GLUCOSEU NEGATIVE 02/28/2018 0446   GLUCOSEU NEGATIVE 06/17/2014 1301   HGBUR MODERATE (A) 02/28/2018 0446   HGBUR moderate 04/09/2010 1326   BILIRUBINUR NEGATIVE 02/28/2018 0446   BILIRUBINUR neg 04/01/2011 1137   KETONESUR 5 (  A) 02/28/2018 0446   PROTEINUR NEGATIVE 02/28/2018 0446   UROBILINOGEN 0.2 06/17/2014 1301   NITRITE NEGATIVE 02/28/2018 0446   LEUKOCYTESUR NEGATIVE 02/28/2018 0446    Sepsis Labs: Lactic Acid, Venous No results found for: LATICACIDVEN  MICROBIOLOGY: No results found for this or any previous visit (from the past 240 hour(s)).  RADIOLOGY STUDIES/RESULTS: Ct Head Wo Contrast  Result Date: 02/28/2018 CLINICAL DATA:  Fall striking head against the base port. No loss of consciousness. Scalp laceration to the top of the head. EXAM: CT HEAD WITHOUT CONTRAST CT CERVICAL SPINE WITHOUT CONTRAST TECHNIQUE: Multidetector CT imaging of the head and cervical spine was performed following the standard protocol without intravenous contrast. Multiplanar CT image reconstructions of the cervical spine were also generated. COMPARISON:  MRI brain 11/06/2011 FINDINGS: CT HEAD FINDINGS Brain: No evidence of acute infarction, hemorrhage, hydrocephalus, extra-axial collection or mass lesion/mass effect. Diffuse cerebral atrophy. Ventricular dilatation consistent with central  atrophy. Low-attenuation changes in the deep white matter consistent with small vessel ischemia. Vascular: No hyperdense vessel or unexpected calcification. Skull: Calvarium appears intact. Sinuses/Orbits: Mucosal thickening in the paranasal sinuses. No acute air-fluid levels. Mastoid air cells are clear. Other: None. CT CERVICAL SPINE FINDINGS Alignment: Normal. Skull base and vertebrae: No acute fracture. No primary bone lesion or focal pathologic process. Old appearing ununited ossicles at the anterior foramen magnum and clivus. Soft tissues and spinal canal: No prevertebral fluid or swelling. No visible canal hematoma. Disc levels: Degenerative changes in the cervical spine with narrowed disc spaces and endplate hypertrophic changes most prominent at C5-6 and C6-7 levels. Degenerative changes in the facet joints. Upper chest: Lung apices are clear. Other: Left thyroid gland nodule measuring 12 mm. IMPRESSION: 1. No acute intracranial abnormalities. Chronic atrophy and small vessel ischemic changes. 2. Normal alignment of the cervical spine. Degenerative changes. No acute displaced fractures identified. 3. 12 mm left thyroid gland nodule. Based on lesion size and patient age, no further evaluation is indicated. Electronically Signed   By: Lucienne Capers M.D.   On: 02/28/2018 05:57   Ct Chest Wo Contrast  Result Date: 02/28/2018 CLINICAL DATA:  Patient lost balance and fell this morning. Suspicion of rib fractures. EXAM: CT CHEST WITHOUT CONTRAST TECHNIQUE: Multidetector CT imaging of the chest was performed following the standard protocol without IV contrast. COMPARISON:  09/25/2007 FINDINGS: Cardiovascular: Normal heart size. No pericardial effusion. Coronary artery calcifications. Dilated ascending thoracic aorta measuring 4.4 cm diameter. Scattered aortic calcifications. Mediastinum/Nodes: No significant lymphadenopathy in the chest. Esophagus is decompressed. 12 mm left thyroid gland nodule.  Lungs/Pleura: Pulmonary hyperinflation. Peribronchial thickening with peribronchial streaky infiltrates in the right lung base. Focal consolidation peripherally in the right lower lung at the costophrenic angle. Appearance suggest bronchopneumonia. Left lung is clear. No pleural effusions. No pneumothorax. Upper Abdomen: No acute changes identified. Musculoskeletal: Nondisplaced fracture of the right posterior 12 and eleventh ribs at the costovertebral junction. Normal alignment of the thoracic spine. No vertebral compression deformities. No depressed sternal fractures. IMPRESSION: 1. Nondisplaced fracture of the right posterior 12 and eleventh ribs at the costovertebral junction. 2. Peribronchial thickening with peribronchial infiltrates in the right lung base consistent with bronchopneumonia. 3. Dilated ascending thoracic aorta measuring 4.4 cm diameter. Recommend annual imaging followup by CTA or MRA. This recommendation follows 2010 ACCF/AHA/AATS/ACR/ASA/SCA/SCAI/SIR/STS/SVM Guidelines for the Diagnosis and Management of Patients with Thoracic Aortic Disease. Circulation. 2010; 121: J121-K244. Aortic Atherosclerosis (ICD10-I70.0) and Emphysema (ICD10-J43.9). Electronically Signed   By: Lucienne Capers M.D.   On: 02/28/2018 06:02  Ct Cervical Spine Wo Contrast  Result Date: 02/28/2018 CLINICAL DATA:  Fall striking head against the base port. No loss of consciousness. Scalp laceration to the top of the head. EXAM: CT HEAD WITHOUT CONTRAST CT CERVICAL SPINE WITHOUT CONTRAST TECHNIQUE: Multidetector CT imaging of the head and cervical spine was performed following the standard protocol without intravenous contrast. Multiplanar CT image reconstructions of the cervical spine were also generated. COMPARISON:  MRI brain 11/06/2011 FINDINGS: CT HEAD FINDINGS Brain: No evidence of acute infarction, hemorrhage, hydrocephalus, extra-axial collection or mass lesion/mass effect. Diffuse cerebral atrophy. Ventricular  dilatation consistent with central atrophy. Low-attenuation changes in the deep white matter consistent with small vessel ischemia. Vascular: No hyperdense vessel or unexpected calcification. Skull: Calvarium appears intact. Sinuses/Orbits: Mucosal thickening in the paranasal sinuses. No acute air-fluid levels. Mastoid air cells are clear. Other: None. CT CERVICAL SPINE FINDINGS Alignment: Normal. Skull base and vertebrae: No acute fracture. No primary bone lesion or focal pathologic process. Old appearing ununited ossicles at the anterior foramen magnum and clivus. Soft tissues and spinal canal: No prevertebral fluid or swelling. No visible canal hematoma. Disc levels: Degenerative changes in the cervical spine with narrowed disc spaces and endplate hypertrophic changes most prominent at C5-6 and C6-7 levels. Degenerative changes in the facet joints. Upper chest: Lung apices are clear. Other: Left thyroid gland nodule measuring 12 mm. IMPRESSION: 1. No acute intracranial abnormalities. Chronic atrophy and small vessel ischemic changes. 2. Normal alignment of the cervical spine. Degenerative changes. No acute displaced fractures identified. 3. 12 mm left thyroid gland nodule. Based on lesion size and patient age, no further evaluation is indicated. Electronically Signed   By: Lucienne Capers M.D.   On: 02/28/2018 05:57     LOS: 0 days   Oren Binet, MD  [facility]  If 7PM-7AM, please contact night-coverage  Please page via [email or web address] [email or web address]-click on MD name and type text message  03/04/2018, 1:55 PM

## 2018-03-05 DIAGNOSIS — W19XXXA Unspecified fall, initial encounter: Secondary | ICD-10-CM | POA: Diagnosis not present

## 2018-03-05 DIAGNOSIS — F10129 Alcohol abuse with intoxication, unspecified: Secondary | ICD-10-CM

## 2018-03-05 DIAGNOSIS — S2241XA Multiple fractures of ribs, right side, initial encounter for closed fracture: Secondary | ICD-10-CM | POA: Diagnosis not present

## 2018-03-05 DIAGNOSIS — F0390 Unspecified dementia without behavioral disturbance: Secondary | ICD-10-CM

## 2018-03-05 DIAGNOSIS — S0101XA Laceration without foreign body of scalp, initial encounter: Secondary | ICD-10-CM | POA: Diagnosis not present

## 2018-03-05 MED ORDER — ACETAMINOPHEN 500 MG PO TABS: 500.0000 mg | ORAL_TABLET | Freq: Four times a day (QID) | ORAL | 0 refills | Status: AC | PRN

## 2018-03-05 MED ORDER — FOLIC ACID 1 MG PO TABS: 1.0000 mg | ORAL_TABLET | Freq: Every day | ORAL | Status: AC

## 2018-03-05 MED ORDER — THIAMINE HCL 100 MG PO TABS: 100.0000 mg | ORAL_TABLET | Freq: Every day | ORAL | Status: AC

## 2018-03-05 NOTE — Social Work (Addendum)
PT note submitted to [facility], continue to await determination.   3:57pm- Authorization approved for pt Auth# [id number]  Approved for 3 days initially with review date 8/21  RVB 543 minutes per week  [place], [place] Work [phone]

## 2018-03-05 NOTE — Progress Notes (Addendum)
Discharge orders received, pt for discharge today to [facility].  IV D/C, staples still intact to pt head, this was included in report to SNF RN.  D/C instructions and Rx in packet for receiving facility and report called to SNF.  Son updated regarding disposition, per social work. PTAR called/en route.

## 2018-03-05 NOTE — Discharge Instructions (Signed)
Follow with Primary MD Binnie Rail, MD in 7 days   Get CBC, CMP, 2 view Chest X ray checked  by Primary MD or SNF MD in 5-7 days    Activity: As tolerated with Full fall precautions use walker/cane & assistance as needed  Disposition SNF  Diet:  Heart Healthy    For Heart failure patients - Check your Weight same time everyday, if you gain over 2 pounds, or you develop in leg swelling, experience more shortness of breath or chest pain, call your Primary MD immediately. Follow Cardiac Low Salt Diet and 1.5 lit/day fluid restriction.  Special Instructions: If you have smoked or chewed Tobacco  in the last 2 yrs please stop smoking, stop any regular Alcohol  and or any Recreational drug use.  On your next visit with your primary care physician please Get Medicines reviewed and adjusted.  Please request your Prim.MD to go over all Hospital Tests and Procedure/Radiological results at the follow up, please get all Hospital records sent to your Prim MD by signing hospital release before you go home.  If you experience worsening of your admission symptoms, develop shortness of breath, life threatening emergency, suicidal or homicidal thoughts you must seek medical attention immediately by calling 911 or calling your MD immediately  if symptoms less severe.  You Must read complete instructions/literature along with all the possible adverse reactions/side effects for all the Medicines you take and that have been prescribed to you. Take any new Medicines after you have completely understood and accpet all the possible adverse reactions/side effects.

## 2018-03-05 NOTE — Progress Notes (Signed)
Physical Therapy Treatment Patient Details Name: VAIDA KERCHNER MRN: [id number] DOB: [birth year] Today's Date: 03/05/2018    History of Present Illness Pt is a 77 y/o female with past medical history significant for dementia, alcohol abuse, hypertension, hyperlipidemia, brought to the emergency department after being found by her son on the kitchen floor, surrounded by blood due to a large scalp laceration.  Per son report, it appears that she hit her head on the kitchen counter.  At the time, she was awake and alert, but slow to respond. Pt found to have Nondisplaced fracture of the right posterior 12 and eleventh ribs at the costovertebral junction.    PT Comments    Pt limited this session secondary to increased nausea. Pt required continuous verbal cues throughout gait for sequencing and was very unsteady; required min A with brief periods of min guard for steadying assist. Current recommendations appropriate. Will continue to follow acutely to maximize functional mobility independence and safety.    Follow Up Recommendations  SNF     Equipment Recommendations  None recommended by PT    Recommendations for Other Services       Precautions / Restrictions Precautions Precautions: Fall Restrictions Weight Bearing Restrictions: No    Mobility  Bed Mobility Overal bed mobility: Needs Assistance Bed Mobility: Sidelying to Sit   Sidelying to sit: Min assist       General bed mobility comments: Min A for trunk elevation.   Transfers Overall transfer level: Needs assistance Equipment used: 1 person hand held assist   Sit to Stand: Min assist         General transfer comment: Min A for lift assist and steadying. Pt with increased sway upon standing requiring min A for steadying.   Ambulation/Gait Ambulation/Gait assistance: Min assist;Min guard Gait Distance (Feet): 75 Feet Assistive device: 1 person hand held assist Gait Pattern/deviations: Step-through pattern;Decreased  stride length;Drifts right/left Gait velocity: decreased   General Gait Details: Slow, unsteady gait requiring min A with brief periods of min guard. Pt stopping at door and required multimodal cues to continue ambulation. Pt reporting increased nausea which limited further gait distance.    Stairs             Wheelchair Mobility    Modified Rankin (Stroke Patients Only)       Balance Overall balance assessment: Needs assistance Sitting-balance support: Feet supported Sitting balance-Leahy Scale: Good     Standing balance support: Single extremity supported Standing balance-Leahy Scale: Poor Standing balance comment: minA for standing/dynamic balance; pt often reaching out for items in room for additional UE support                             Cognition Arousal/Alertness: Awake/alert Behavior During Therapy: WFL for tasks assessed/performed Overall Cognitive Status: Impaired/Different from baseline Area of Impairment: Orientation;Memory;Following commands;Safety/judgement;Problem solving                 Orientation Level: Disoriented to;Time;Situation;Place   Memory: Decreased recall of precautions;Decreased short-term memory Following Commands: Follows one step commands with increased time Safety/Judgement: Decreased awareness of safety;Decreased awareness of deficits   Problem Solving: Slow processing;Difficulty sequencing;Requires verbal cues General Comments: Pt with slow processing and required multiple cues throughout mobility for attention to task. Pt repeating statements throughout session.       Exercises General Exercises - Lower Extremity Toe Raises: AROM;Both;5 reps;Seated(required demonstration ) Heel Raises: AROM;Both;5 reps;Seated(required demonstration )    General  Comments        Pertinent Vitals/Pain Pain Assessment: Faces Faces Pain Scale: Hurts a little bit Pain Location: ribs Pain Descriptors / Indicators: Sore Pain  Intervention(s): Limited activity within patient's tolerance;Monitored during session;Repositioned    Home Living                      Prior Function            PT Goals (current goals can now be found in the care plan section) Acute Rehab PT Goals Patient Stated Goal: "I'm hungry"  PT Goal Formulation: Patient unable to participate in goal setting Time For Goal Achievement: 03/14/18 Potential to Achieve Goals: Fair Progress towards PT goals: Progressing toward goals    Frequency    Min 2X/week      PT Plan Current plan remains appropriate    Co-evaluation              AM-PAC PT "6 Clicks" Daily Activity  Outcome Measure  Difficulty turning over in bed (including adjusting bedclothes, sheets and blankets)?: None Difficulty moving from lying on back to sitting on the side of the bed? : Unable Difficulty sitting down on and standing up from a chair with arms (e.g., wheelchair, bedside commode, etc,.)?: Unable Help needed moving to and from a bed to chair (including a wheelchair)?: A Little Help needed walking in hospital room?: A Little Help needed climbing 3-5 steps with a railing? : A Lot 6 Click Score: 14    End of Session Equipment Utilized During Treatment: Gait belt Activity Tolerance: Treatment limited secondary to medical complications (Comment)(nausea ) Patient left: in chair;with call bell/phone within reach;with chair alarm set Nurse Communication: Mobility status;Other (comment)(pt voided ) PT Visit Diagnosis: Other abnormalities of gait and mobility (R26.89)     Time: 1249-1310 PT Time Calculation (min) (ACUTE ONLY): 21 min  Charges:  $Gait Training: 8-22 mins                     Leighton Ruff, PT, DPT  Acute Rehabilitation Services  Pager: [phone]    Rudean Hitt 03/05/2018, 1:32 PM

## 2018-03-05 NOTE — Discharge Summary (Signed)
[name] Fox WER:[id number] DOB: September 14, 1940 DOA: 02/28/2018  PCP: Binnie Rail, MD  Admit date: 02/28/2018  Discharge date: 03/05/2018  Admitted From: Home   Disposition:  SNF   Recommendations for Outpatient Follow-up:   Follow up with PCP in 1-2 weeks  PCP Please obtain BMP/CBC, 2 view CXR in 1week,  (see Discharge instructions)   PCP Please follow up on the following pending results:    Home Health:    Equipment/Devices:   Consultations: Psych Discharge Condition: Fair   CODE STATUS: Full   Diet Recommendation:  Heart Healthy    Chief Complaint  Patient presents with  . Fall     Brief history of present illness from the day of admission and additional interim summary    Patient is a 77 y.o. female with history of hypertension, dyslipidemia, EtOH use, dementia presented with a mechanical fall with resultant scalp injury.  Alcohol level was 270 on admission.  Admitted to the hospital service for supportive care.  Seen by psychiatry, not felt to have capacity.  Currently awaiting SNF bed.  See below for further details.                                                                 Hospital Course    Mechanical fall due to alcohol intoxication and a scalp laceration: Laceration sutured in the emergency room, person patient up-to-date with tetanus vaccination.  Evaluated by PT, with recommendations to transfer to SNF-she will be discharged to SNF once bed is available medically stable.  Alcohol abuse: No tremors, pleasantly confused-suspect she is at baseline (history of dementia) last drink was on 8/14.  No signs of DTs, placed on folic acid and thiamine and counseled to quit alcohol.  Right-sided rib fracture: Continue supportive care-encourage incentive spirometry.    Tylenol as needed.  Right lung  pneumonia: Question alcohol related aspiration pneumonia but not completely confirmed or ruled out, completes 5 days of doxycycline treatment today.  Clinically resolved.  Hypertension: Controlled, continue losartan and Bystolic.  Dementia/depression: Continue Zoloft, Namenda and Aricept.  Pleasantly confused at times, suspect she is back to usual baseline.  Vitamin B12 within normal limits, TSH in November 2018 was normal.  RPR negative.  Completed high dose of IV thiamine in case she had Korsakoff's-currently mental status back to baseline, did not have any eye signs/diagnosed.  Evaluated by psychiatry, patient does not have capacity to refuse SNF placement.  Patient's son has healthcare power of attorney.  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    Discharge diagnosis     Principal Problem:   Evaluation by psychiatric service required Active Problems:   Dyslipidemia  Hearing loss   Vitamin B12 deficiency   Anxiety state   Depression   Hypertension   Alcohol abuse   Alcohol intoxication (Miguel Barrera)   Bronchopneumonia   Right rib fracture   Ascending aorta dilatation (HCC)   Fall against object    Discharge instructions    Discharge Instructions    Diet - low sodium heart healthy   Complete by:  As directed    Discharge instructions   Complete by:  As directed    Follow with Primary MD Binnie Rail, MD in 7 days   Get CBC, CMP, 2 view Chest X ray checked  by Primary MD or SNF MD in 5-7 days    Activity: As tolerated with Full fall precautions use walker/cane & assistance as needed  Disposition SNF  Diet:  Heart Healthy    For Heart failure patients - Check your Weight same time everyday, if you gain over 2 pounds, or you develop in leg swelling, experience more shortness of breath or chest pain, call your Primary MD immediately. Follow  Cardiac Low Salt Diet and 1.5 lit/day fluid restriction.  Special Instructions: If you have smoked or chewed Tobacco  in the last 2 yrs please stop smoking, stop any regular Alcohol  and or any Recreational drug use.  On your next visit with your primary care physician please Get Medicines reviewed and adjusted.  Please request your Prim.MD to go over all Hospital Tests and Procedure/Radiological results at the follow up, please get all Hospital records sent to your Prim MD by signing hospital release before you go home.  If you experience worsening of your admission symptoms, develop shortness of breath, life threatening emergency, suicidal or homicidal thoughts you must seek medical attention immediately by calling 911 or calling your MD immediately  if symptoms less severe.  You Must read complete instructions/literature along with all the possible adverse reactions/side effects for all the Medicines you take and that have been prescribed to you. Take any new Medicines after you have completely understood and accpet all the possible adverse reactions/side effects.   Increase activity slowly   Complete by:  As directed       Discharge Medications   Allergies as of 03/05/2018      Reactions   Penicillins Shortness Of Breath, Swelling, Rash   Amlodipine Rash   Sulfasalazine Rash      Medication List    TAKE these medications   acetaminophen 500 MG tablet Commonly known as:  TYLENOL Take 1 tablet (500 mg total) by mouth every 6 (six) hours as needed.   CALCIUM 600 PO Take 1,200 mg by mouth daily.   donepezil 10 MG tablet Commonly known as:  ARICEPT Take 1 tablet (10 mg total) at bedtime by mouth.   folic acid 1 MG tablet Commonly known as:  FOLVITE Take 1 tablet (1 mg total) by mouth daily. Start taking on:  03/06/2018   loratadine 10 MG tablet Commonly known as:  CLARITIN Take 10 mg by mouth daily.   losartan 100 MG tablet Commonly known as:  COZAAR Take 1 tablet (100  mg total) by mouth daily.   memantine 10 MG tablet Commonly known as:  NAMENDA Take 1 tablet (10 mg total) by mouth 2 (two) times daily.   nebivolol 10 MG tablet Commonly known as:  BYSTOLIC Take 1 tablet (10 mg total) daily by mouth.   pravastatin 20 MG tablet Commonly known as:  PRAVACHOL Take 1 tablet (20 mg total) by  mouth daily.   sertraline 100 MG tablet Commonly known as:  ZOLOFT Take 1.5 tablets (150 mg total) daily by mouth.   thiamine 100 MG tablet Take 1 tablet (100 mg total) by mouth daily. Start taking on:  03/06/2018   triamcinolone cream 0.1 % Commonly known as:  KENALOG Compound 1:1 with Eucerin cream and apply bid as needed to affected rash   vitamin B-12 1000 MCG tablet Commonly known as:  CYANOCOBALAMIN Take 1 tablet (1,000 mcg total) by mouth daily.   Vitamin D (Cholecalciferol) 1000 units Tabs Take 1,000 Units by mouth daily.        Contact information for follow-up providers    Binnie Rail, MD. Schedule an appointment as soon as possible for a visit in 1 week(s).   Specialty:  Internal Medicine Contact information: [place] [address] [phone]            Contact information for after-discharge care    Destination    [facility]erred SN[facility] .   Service:  Skilled Nursing Contact information: [place] [phone]                  Major procedures and Radiology Reports - PLEASE review detailed and final reports thoroughly  -         Ct Head Wo Contrast  Result Date: 02/28/2018 CLINICAL DATA:  Fall striking head against the base port. No loss of consciousness. Scalp laceration to the top of the head. EXAM: CT HEAD WITHOUT CONTRAST CT CERVICAL SPINE WITHOUT CONTRAST TECHNIQUE: Multidetector CT imaging of the head and cervical spine was performed following the standard protocol without intravenous contrast. Multiplanar CT image reconstructions  of the cervical spine were also generated. COMPARISON:  MRI brain 11/06/2011 FINDINGS: CT HEAD FINDINGS Brain: No evidence of acute infarction, hemorrhage, hydrocephalus, extra-axial collection or mass lesion/mass effect. Diffuse cerebral atrophy. Ventricular dilatation consistent with central atrophy. Low-attenuation changes in the deep white matter consistent with small vessel ischemia. Vascular: No hyperdense vessel or unexpected calcification. Skull: Calvarium appears intact. Sinuses/Orbits: Mucosal thickening in the paranasal sinuses. No acute air-fluid levels. Mastoid air cells are clear. Other: None. CT CERVICAL SPINE FINDINGS Alignment: Normal. Skull base and vertebrae: No acute fracture. No primary bone lesion or focal pathologic process. Old appearing ununited ossicles at the anterior foramen magnum and clivus. Soft tissues and spinal canal: No prevertebral fluid or swelling. No visible canal hematoma. Disc levels: Degenerative changes in the cervical spine with narrowed disc spaces and endplate hypertrophic changes most prominent at C5-6 and C6-7 levels. Degenerative changes in the facet joints. Upper chest: Lung apices are clear. Other: Left thyroid gland nodule measuring 12 mm. IMPRESSION: 1. No acute intracranial abnormalities. Chronic atrophy and small vessel ischemic changes. 2. Normal alignment of the cervical spine. Degenerative changes. No acute displaced fractures identified. 3. 12 mm left thyroid gland nodule. Based on lesion size and patient age, no further evaluation is indicated. Electronically Signed   By: Lucienne Capers M.D.   On: 02/28/2018 05:57   Ct Chest Wo Contrast  Result Date: 02/28/2018 CLINICAL DATA:  Patient lost balance and fell this morning. Suspicion of rib fractures. EXAM: CT CHEST WITHOUT CONTRAST TECHNIQUE: Multidetector CT imaging of the chest was performed following the standard protocol without IV contrast. COMPARISON:  09/25/2007 FINDINGS: Cardiovascular: Normal  heart size. No pericardial effusion. Coronary artery calcifications. Dilated ascending thoracic aorta measuring 4.4 cm diameter. Scattered aortic calcifications. Mediastinum/Nodes: No  significant lymphadenopathy in the chest. Esophagus is decompressed. 12 mm left thyroid gland nodule. Lungs/Pleura: Pulmonary hyperinflation. Peribronchial thickening with peribronchial streaky infiltrates in the right lung base. Focal consolidation peripherally in the right lower lung at the costophrenic angle. Appearance suggest bronchopneumonia. Left lung is clear. No pleural effusions. No pneumothorax. Upper Abdomen: No acute changes identified. Musculoskeletal: Nondisplaced fracture of the right posterior 12 and eleventh ribs at the costovertebral junction. Normal alignment of the thoracic spine. No vertebral compression deformities. No depressed sternal fractures. IMPRESSION: 1. Nondisplaced fracture of the right posterior 12 and eleventh ribs at the costovertebral junction. 2. Peribronchial thickening with peribronchial infiltrates in the right lung base consistent with bronchopneumonia. 3. Dilated ascending thoracic aorta measuring 4.4 cm diameter. Recommend annual imaging followup by CTA or MRA. This recommendation follows 2010 ACCF/AHA/AATS/ACR/ASA/SCA/SCAI/SIR/STS/SVM Guidelines for the Diagnosis and Management of Patients with Thoracic Aortic Disease. Circulation. 2010; 121: J628-Z662. Aortic Atherosclerosis (ICD10-I70.0) and Emphysema (ICD10-J43.9). Electronically Signed   By: Lucienne Capers M.D.   On: 02/28/2018 06:02   Ct Cervical Spine Wo Contrast  Result Date: 02/28/2018 CLINICAL DATA:  Fall striking head against the base port. No loss of consciousness. Scalp laceration to the top of the head. EXAM: CT HEAD WITHOUT CONTRAST CT CERVICAL SPINE WITHOUT CONTRAST TECHNIQUE: Multidetector CT imaging of the head and cervical spine was performed following the standard protocol without intravenous contrast. Multiplanar CT  image reconstructions of the cervical spine were also generated. COMPARISON:  MRI brain 11/06/2011 FINDINGS: CT HEAD FINDINGS Brain: No evidence of acute infarction, hemorrhage, hydrocephalus, extra-axial collection or mass lesion/mass effect. Diffuse cerebral atrophy. Ventricular dilatation consistent with central atrophy. Low-attenuation changes in the deep white matter consistent with small vessel ischemia. Vascular: No hyperdense vessel or unexpected calcification. Skull: Calvarium appears intact. Sinuses/Orbits: Mucosal thickening in the paranasal sinuses. No acute air-fluid levels. Mastoid air cells are clear. Other: None. CT CERVICAL SPINE FINDINGS Alignment: Normal. Skull base and vertebrae: No acute fracture. No primary bone lesion or focal pathologic process. Old appearing ununited ossicles at the anterior foramen magnum and clivus. Soft tissues and spinal canal: No prevertebral fluid or swelling. No visible canal hematoma. Disc levels: Degenerative changes in the cervical spine with narrowed disc spaces and endplate hypertrophic changes most prominent at C5-6 and C6-7 levels. Degenerative changes in the facet joints. Upper chest: Lung apices are clear. Other: Left thyroid gland nodule measuring 12 mm. IMPRESSION: 1. No acute intracranial abnormalities. Chronic atrophy and small vessel ischemic changes. 2. Normal alignment of the cervical spine. Degenerative changes. No acute displaced fractures identified. 3. 12 mm left thyroid gland nodule. Based on lesion size and patient age, no further evaluation is indicated. Electronically Signed   By: Lucienne Capers M.D.   On: 02/28/2018 05:57    Micro Results     No results found for this or any previous visit (from the past 240 hour(s)).  Today   Subjective    Rainelle Sulewski today has no headache,no chest abdominal pain,no new weakness tingling or numbness, feels much better    Objective   Blood pressure 155/90, pulse (!) 57, temperature 97.9 F  (36.6 C), temperature source Oral, resp. rate 18, height 5\' 7"  (1.702 m), weight 47.4 kg, SpO2 99 %.   Intake/Output Summary (Last 24 hours) at 03/05/2018 1034 Last data filed at 03/04/2018 1500 Gross per 24 hour  Intake 50 ml  Output -  Net 50 ml    Exam Awake Alert, Oriented x 2, No new F.N deficits, Normal affect  Parker.AT,PERRAL Supple Neck,No JVD, No cervical lymphadenopathy appriciated.  Symmetrical Chest wall movement, Good air movement bilaterally, CTAB RRR,No Gallops,Rubs or new Murmurs, No Parasternal Heave +ve B.Sounds, Abd Soft, Non tender, No organomegaly appriciated, No rebound -guarding or rigidity. No Cyanosis, Clubbing or edema, No new Rash or bruise   Data Review   CBC w Diff:  Lab Results  Component Value Date   WBC 5.7 03/01/2018   HGB 10.5 (L) 03/01/2018   HCT 30.6 (L) 03/01/2018   PLT 232 03/01/2018   LYMPHOPCT 11 02/28/2018   MONOPCT 5 02/28/2018   EOSPCT 1 02/28/2018   BASOPCT 1 02/28/2018    CMP:  Lab Results  Component Value Date   NA 134 (L) 03/01/2018   K 4.0 03/01/2018   CL 99 03/01/2018   CO2 26 03/01/2018   BUN 12 03/01/2018   CREATININE 0.86 03/01/2018   PROT 5.5 (L) 03/01/2018   ALBUMIN 3.2 (L) 03/01/2018   BILITOT 1.6 (H) 03/01/2018   ALKPHOS 58 03/01/2018   AST 35 03/01/2018   ALT 28 03/01/2018  .   Total Time in preparing paper work, data evaluation and todays exam - 19 minutes  Lala Lund M.D on 03/05/2018 at 10:34 AM  [facility]   Office  [phone]

## 2018-03-05 NOTE — Social Work (Signed)
Clinical Social Worker facilitated patient discharge including contacting patient family and facility to confirm patient discharge plans.  Clinical information faxed to facility and family agreeable with plan.  CSW arranged ambulance transport via [facility] to [place] RN to call [phone] with report  prior to discharge.  Clinical Social Worker will sign off for now as social work intervention is no longer needed. Please consult [place] again if new need arises.  Alexander Mt, [place] Social Worker

## 2018-03-05 NOTE — Social Work (Signed)
Pt insurance still pending, pt requiring updated PT note from last 24 hours for [facility], unable to discharge to SNF without insurance auth.   CSW has paged [facility] for PT to be assigned to pt.   Alexander Mt, [place] Work [phone]

## 2018-03-05 NOTE — Clinical Social Work Placement (Signed)
   CLINICAL SOCIAL WORK PLACEMENT  NOTE [facility]  RN to call report [phone]  Date:  03/05/2018  Patient Details  Name: [name] Fox MRN: [id number] Date of Birth: 02-18-1941  Clinical Social Work is seeking post-discharge placement for this patient at the [place] level of care (*CSW will initial, date and re-position this form in  chart as items are completed):  Yes   Patient/family provided with [facility] Work Department's list of facilities offering this level of care within the geographic area requested by the patient (or if unable, by the patient's family).  Yes   Patient/family informed of their freedom to choose among providers that offer the needed level of care, that participate in Medicare, Medicaid or managed care program needed by the patient, have an available bed and are willing to accept the patient.  Yes   Patient/family informed of [facility]'s ownership interest in [facility] and [facility], as well as of the fact that they are under no obligation to receive care at these facilities.  PASRR submitted to EDS on 03/01/18     PASRR number received on 03/02/18     Existing PASRR number confirmed on       FL2 transmitted to all facilities in geographic area requested by pt/family on 03/01/18     FL2 transmitted to all facilities within larger geographic area on       Patient informed that his/her managed care company has contracts with or will negotiate with certain facilities, including the following:        Yes   Patient/family informed of bed offers received.  Patient chooses bed at [facility]     Physician recommends and patient chooses bed at      Patient to be transferred to [facility] on 03/05/18.  Patient to be transferred to facility by [facility]     Patient family notified on 03/05/18 of transfer.  Name of family member notified:  pt son, [name]     PHYSICIAN Please  prepare priority discharge summary, including medications, Please prepare prescriptions     Additional Comment:    _______________________________________________ Alexander Mt, LCSWA 03/05/2018, 4:28 PM

## 2018-03-06 ENCOUNTER — Telehealth: Payer: Self-pay | Admitting: *Deleted

## 2018-03-06 NOTE — Telephone Encounter (Signed)
Pt was on TCM report admitted 02/28/18 after a mechanical fall with resultant scalp injury for supportive care. After evaluation by PT,was recommend to transfer to SNF. Pt D/C8/19/19 to SNF per summary will need to f/u w/PCP once she leave skill nursing.Marland KitchenJohny Chess

## 2018-03-07 NOTE — Telephone Encounter (Signed)
Pt's brother, Quita Skye, would like to speak with the doctor.  States that pt has been moved to [facility].  States pt has dementia and doesn't have anyone here to help her.

## 2018-03-08 ENCOUNTER — Telehealth: Payer: Self-pay | Admitting: Internal Medicine

## 2018-03-08 NOTE — Telephone Encounter (Signed)
Copied from [place] [id number]. Topic: Quick Communication - See Telephone Encounter >> Mar 08, 2018  5:33 PM Rutherford Nail, [place] wrote: CRM for notification. See Telephone encounter for: 03/08/18. Patient's niece, Tomi Bamberger Perkins(on DPR), calling and would like a call back regarding an update on patient's condition after recent hospitalization.  CB#: [phone]

## 2018-03-08 NOTE — Telephone Encounter (Signed)
Spoke with pts brother and he advised that pt has been in the hospital recently and now is at [facility] because of a fall she had last week and she hit her head. Brother is not on DPR so I was not allowed to discuss any information with him he just wanted to make you aware. He stated he would get Betsy Pries to call back.

## 2018-03-09 NOTE — Telephone Encounter (Signed)
LVM for pt to call back in regards.  

## 2018-03-22 NOTE — Telephone Encounter (Signed)
LVM for niece letting her know any past lab results from pts hospital visit would be easier discussed in an office visit.

## 2018-04-18 ENCOUNTER — Telehealth: Payer: Self-pay

## 2018-04-18 NOTE — Telephone Encounter (Signed)
Spoke with [name] Fox and she wanted to know while pt is in [facility] if she can still see Dr. Quay Burow for OVs. She stated that [facility] told them that pt could not see Dr. Quay Burow due to her insurance and it would not be covered. I advised per Dr. Jenny Reichmann pt should be able to still see Dr. Quay Burow. [name] Fox understood.

## 2018-04-18 NOTE — Telephone Encounter (Signed)
Copied from [place] [id number]. Topic: General - Other >> Apr 18, 2018  2:20 PM Sheran Luz wrote: Reason for CRM: Pts niece, Betsy Pries, is requesting a call back from Olivia Mackie to discuss coordinating care for pt with [facility].

## 2018-04-24 NOTE — Progress Notes (Signed)
Subjective:    Patient ID: [name] Fox, female    DOB: 1941/06/18, 77 y.o.   MRN: [id number]  HPI The patient is here for follow up.  Admitted 02/28/18 - 03/05/18 and was discharged to a SNF.  She was taken to the emergency room after mechanical fall due to alcohol intoxication.  She did suffer a scalp laceration that was sutured in the emergency room.  She was evaluated by PT and they recommended discharge to SNF, which is where she was discharged to.  She was counseled to quit alcohol.  There is no sign of DTs while she was in the hospital.  She was started on folic acid and thiamine.  She did suffer a right-sided rib fracture which she was treated supportively with Tylenol as needed.  She was encouraged to use the incentive spirometry.  She was found to have right lung pneumonia, which was questionable aspiration related to alcohol.  She did complete 5 days of doxycycline and her symptoms were clinically resolved.  She was continued on her blood pressure medications, which was stable.  For her dementia and depression she was continued on Zoloft, Namenda and Aricept.  It was felt that she was back to her baseline at discharge.  She was evaluated by psychiatry and was found to not have the capacity to refuse SNF placement.  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  Continue annual follow-up as an outpatient. She did well in the hospital and was discharged to [facility].  She is still there.   She is here today with her brother and sister - in-law.  Her brother now has POA and her sister-in-law is back up.  They did have some questions regarding her hospitalization and blood work done at that time.  They did not receive any information regarding her hospitalization..  At this point she will be remaining in a skilled nursing facility or assisted living facility.  She will not  be returning home.  The question is if she will be staying in this area or possibly moving closer to her brother.  Overall she states she feels much better.  She is obviously not drinking any alcohol.  She is eating 3 meals a day and is sleeping well.  She knows she needs to increase her exercise, but other than that feels well.  She has no physical concerns.  She denies any depression or anxiety.  Her brother and sister-in-law just wanted to get an overall picture of her health and anything that needs to be followed up.  They are unsure of where she will be residing long-term.  They have been not gotten much information from [facility] regarding her blood work her current medical issues.   She is taking all her medication as prescribed.  Medications and allergies reviewed with patient and updated if appropriate.  Patient Active Problem List   Diagnosis Date Noted  . Evaluation by psychiatric service required   . Alcohol abuse 02/28/2018  . Alcohol intoxication ([place]) 02/28/2018  . Bronchopneumonia 02/28/2018  . Right rib fracture 02/28/2018  . Ascending aorta dilatation (HCC) 02/28/2018  . Fall against object 02/28/2018  . Rash and nonspecific skin eruption 02/23/2017  . Numbness and tingling in right hand   . Hypertension 11/15/2013  . Weight loss, unintentional 04/12/2013  . Depression   . PMR (polymyalgia rheumatica) (HCC) 10/31/2011  .  Vitamin B12 deficiency 08/31/2010  . Anxiety state 08/31/2010  . Dementia ([place]) 08/31/2010  . Dyslipidemia 12/08/2009  . KELOID 11/19/2009  . Hearing loss 08/20/2009  . 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 BRONCHIECTASIS WITHOUT ACUTE EXACERBATION 10/18/2007  . HERPES ZOSTER 09/10/2007  . ALLERGIC RHINITIS 04/06/2007  . FIBROCYSTIC BREAST DISEASE 04/06/2007  . Osteoporosis     Current Outpatient Medications on File Prior to Visit  Medication Sig Dispense Refill  . acetaminophen (TYLENOL) 500 MG tablet Take 1 tablet  (500 mg total) by mouth every 6 (six) hours as needed. 100 tablet 0  . Calcium Carbonate (CALCIUM 600 PO) Take 1,200 mg by mouth daily.    Marland Kitchen donepezil (ARICEPT) 10 MG tablet Take 1 tablet (10 mg total) at bedtime by mouth. 90 tablet 1  . folic acid (FOLVITE) 1 MG tablet Take 1 tablet (1 mg total) by mouth daily.    Marland Kitchen loratadine (CLARITIN) 10 MG tablet Take 10 mg by mouth daily.    Marland Kitchen losartan (COZAAR) 100 MG tablet Take 1 tablet (100 mg total) by mouth daily. 90 tablet 3  . memantine (NAMENDA) 10 MG tablet Take 1 tablet (10 mg total) by mouth 2 (two) times daily. 180 tablet 0  . nebivolol (BYSTOLIC) 10 MG tablet Take 1 tablet (10 mg total) daily by mouth. 30 tablet 5  . potassium chloride SA (K-DUR,KLOR-CON) 20 MEQ tablet Take 20 mEq by mouth 2 (two) times daily.    . pravastatin (PRAVACHOL) 20 MG tablet Take 1 tablet (20 mg total) by mouth daily. 90 tablet 3  . sertraline (ZOLOFT) 100 MG tablet Take 1.5 tablets (150 mg total) daily by mouth. 135 tablet 1  . thiamine 100 MG tablet Take 1 tablet (100 mg total) by mouth daily.    Marland Kitchen triamcinolone cream (KENALOG) 0.1 % Compound 1:1 with Eucerin cream and apply bid as needed to affected rash 453 g 1  . vitamin B-12 (CYANOCOBALAMIN) 1000 MCG tablet Take 1 tablet (1,000 mcg total) by mouth daily.    . Vitamin D, Cholecalciferol, 1000 units TABS Take 1,000 Units by mouth daily.     No current facility-administered medications on file prior to visit.     Past Medical History:  Diagnosis Date  . Adenomatous colon polyp 05/26/1999   procedure at [facility] in [place], [place]   . Anemia   . ANXIETY   . DEGENERATIVE JOINT DISEASE, KNEES, BILATERAL   . Dementia ([place]) 08/31/2010   neuropsyc eval 10/2012 and follows with neuro  . Diverticulosis   . DYSLIPIDEMIA   . ETOH abuse   . Fibrocystic breast disease   . HEARING LOSS, BILATERAL   . Hemorrhoids   . HERPES ZOSTER   . Hypertension   . Osteoporosis    DEXA A LB  09/22/14: -2.4 R fem, progressive decline since 2011    . PMR (polymyalgia rheumatica) (HCC) dx 10/2011   Initially dx GCA, then PMR clarified -on pred, follows with rheum  . VITAMIN B12 DEFICIENCY     Past Surgical History:  Procedure Laterality Date  . Bilateral Mastectomy     while removing cosmetic silicone implants (NO Breast cancer)  . Bilateral silicone Breast explant  1993   Due to rupture/leak @ [place] History   Socioeconomic History  . Marital status: Divorced    Spouse name: Not on file  . Number of children: Not on file  . Years of education: Not  on file  . Highest education level: Not on file  Occupational History  . Not on file  Social Needs  . Financial resource strain: Not on file  . Food insecurity:    Worry: Not on file    Inability: Not on file  . Transportation needs:    Medical: Not on file    Non-medical: Not on file  Tobacco Use  . Smoking status: Never Smoker  . Smokeless tobacco: Never Used  Substance and Sexual Activity  . Alcohol use: Yes    Alcohol/week: 3.0 standard drinks    Types: 3 Glasses of wine per week    Comment: 02/2018 wine with dinner every night   . Drug use: No  . Sexual activity: Not Currently  Lifestyle  . Physical activity:    Days per week: Not on file    Minutes per session: Not on file  . Stress: Not on file  Relationships  . Social connections:    Talks on phone: Not on file    Gets together: Not on file    Attends religious service: Not on file    Active member of club or organization: Not on file    Attends meetings of clubs or organizations: Not on file    Relationship status: Not on file  Other Topics Concern  . Not on file  Social History Narrative   Divorced fall 2015, lives alone. adopted son nearby. Retired from public health-nutritionist    Family History  Problem Relation Age of Onset  . Pancreatic cancer Father   . Heart disease Brother   . Colon cancer Paternal Grandfather   .  Colon cancer Paternal Aunt   . Inflammatory bowel disease Cousin     Review of Systems  Constitutional: Negative for appetite change.  Respiratory: Negative for cough, shortness of breath and wheezing.   Cardiovascular: Negative for chest pain, palpitations and leg swelling.  Gastrointestinal: Negative for abdominal pain, constipation, diarrhea and nausea.  Neurological: Positive for light-headedness (occ, dizziness). Negative for headaches.  Psychiatric/Behavioral: Negative for dysphoric mood and sleep disturbance. The patient is not nervous/anxious.        Objective:   Vitals:   04/25/18 1014  BP: 138/64  Resp: 16  Temp: 98.1 F (36.7 C)  SpO2: 93%   BP Readings from Last 3 Encounters:  04/25/18 138/64  03/05/18 (!) 120/45  12/06/17 (!) 156/82   Wt Readings from Last 3 Encounters:  04/25/18 109 lb (49.4 kg)  03/01/18 104 lb 8 oz (47.4 kg)  12/06/17 106 lb (48.1 kg)   Body mass index is 17.07 kg/m.   Physical Exam    Constitutional: Appears well-developed and well-nourished. No distress.  HENT:  Head: Normocephalic and atraumatic.  Neck: Neck supple. No tracheal deviation present. No thyromegaly present.  No cervical lymphadenopathy Cardiovascular: Normal rate, regular rhythm and normal heart sounds.   No murmur heard. No carotid bruit .  No edema Pulmonary/Chest: Effort normal and breath sounds normal. No respiratory distress. No has no wheezes. No rales. Abdomen: Soft, nontender, nondistended Skin: Skin is warm and dry. Not diaphoretic.  Psychiatric: Normal mood and affect. Behavior is normal.      Assessment & Plan:    See Problem List for Assessment and Plan of chronic medical problems.

## 2018-04-25 ENCOUNTER — Ambulatory Visit: Payer: Medicare Other | Admitting: Internal Medicine

## 2018-04-25 ENCOUNTER — Encounter: Payer: Self-pay | Admitting: Internal Medicine

## 2018-04-25 VITALS — BP 138/64 | Temp 98.1°F | Resp 16 | Ht 67.0 in | Wt 109.0 lb

## 2018-04-25 DIAGNOSIS — I77810 Thoracic aortic ectasia: Secondary | ICD-10-CM

## 2018-04-25 DIAGNOSIS — M810 Age-related osteoporosis without current pathological fracture: Secondary | ICD-10-CM

## 2018-04-25 DIAGNOSIS — D126 Benign neoplasm of colon, unspecified: Secondary | ICD-10-CM | POA: Insufficient documentation

## 2018-04-25 DIAGNOSIS — Z23 Encounter for immunization: Secondary | ICD-10-CM

## 2018-04-25 DIAGNOSIS — E785 Hyperlipidemia, unspecified: Secondary | ICD-10-CM

## 2018-04-25 DIAGNOSIS — I10 Essential (primary) hypertension: Secondary | ICD-10-CM | POA: Diagnosis not present

## 2018-04-25 DIAGNOSIS — F411 Generalized anxiety disorder: Secondary | ICD-10-CM

## 2018-04-25 DIAGNOSIS — F32A Depression, unspecified: Secondary | ICD-10-CM

## 2018-04-25 DIAGNOSIS — F329 Major depressive disorder, single episode, unspecified: Secondary | ICD-10-CM

## 2018-04-25 NOTE — Assessment & Plan Note (Signed)
BP well controlled Current regimen effective and well tolerated Continue current medications at current doses  

## 2018-04-25 NOTE — Assessment & Plan Note (Signed)
She did have a rib fracture with her fall in August She is due for bone density scan, which was ordered at her last visit, but not done Discussed with her family members that this is something that should be considered in the next few months depending on where she ends up Would recommend considering treatment for her osteoporosis with Fosamax or possibly Prolia

## 2018-04-25 NOTE — Assessment & Plan Note (Addendum)
Last imaging done 02/2018-4.4 cm  Needs annual follow-up-imaging due 02/2019

## 2018-04-25 NOTE — Patient Instructions (Addendum)
  Flu immunization administered today.     Medications reviewed and updated.  Changes include :   none     Please followup in 6 months   

## 2018-04-25 NOTE — Assessment & Plan Note (Signed)
Continue daily statin Regular exercise and healthy diet encouraged  

## 2018-04-25 NOTE — Assessment & Plan Note (Signed)
Controlled, stable Continue current dose of medication  

## 2018-04-25 NOTE — Assessment & Plan Note (Signed)
Last colonoscopy 2014-recommended 5-year follow-up, which would be this year Discussed with her family members that they should consider meeting with GI to discuss if another colonoscopy should be done

## 2018-06-07 NOTE — Progress Notes (Deleted)
Subjective:    Patient ID: [name] Fox, female    DOB: 05-17-1941, 77 y.o.   MRN: [id number]  HPI The patient is here for follow up.  She is still at [facility].    Hypertension: She is taking her medication daily. She is compliant with a low sodium diet.  She denies chest pain, palpitations, edema, shortness of breath and regular headaches. She is exercising regularly.  She does not monitor her blood pressure at home.    Hyperlipidemia: She is taking her medication daily. She is compliant with a low fat/cholesterol diet. She is exercising regularly. She denies myalgias.   Anxiety, depression: She is taking her medication daily as prescribed. She denies any side effects from the medication. She feels her anxiety and depression are well controlled and she is happy with her current dose of medication.   Osteoporosis:  Dexa done today.    Medications and allergies reviewed with patient and updated if appropriate.  Patient Active Problem List   Diagnosis Date Noted  . Adenomatous colon polyp 04/25/2018  . Alcohol abuse 02/28/2018  . Alcohol intoxication ([place]) 02/28/2018  . Bronchopneumonia 02/28/2018  . Right rib fracture 02/28/2018  . Ascending aorta dilatation (HCC) 02/28/2018  . Fall against object 02/28/2018  . Rash and nonspecific skin eruption 02/23/2017  . Numbness and tingling in right hand   . Hypertension 11/15/2013  . Depression   . PMR (polymyalgia rheumatica) (HCC) 10/31/2011  . Vitamin B12 deficiency 08/31/2010  . Anxiety state 08/31/2010  . Dementia ([place]) 08/31/2010  . Dyslipidemia 12/08/2009  . KELOID 11/19/2009  . Hearing loss 08/20/2009  . 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 BRONCHIECTASIS WITHOUT ACUTE EXACERBATION 10/18/2007  . HERPES ZOSTER 09/10/2007  . ALLERGIC RHINITIS 04/06/2007  . FIBROCYSTIC BREAST DISEASE 04/06/2007  . Osteoporosis     Current Outpatient Medications on File Prior to Visit  Medication Sig Dispense  Refill  . acetaminophen (TYLENOL) 500 MG tablet Take 1 tablet (500 mg total) by mouth every 6 (six) hours as needed. 100 tablet 0  . Calcium Carbonate (CALCIUM 600 PO) Take 1,200 mg by mouth daily.    Marland Kitchen donepezil (ARICEPT) 10 MG tablet Take 1 tablet (10 mg total) at bedtime by mouth. 90 tablet 1  . folic acid (FOLVITE) 1 MG tablet Take 1 tablet (1 mg total) by mouth daily.    Marland Kitchen loratadine (CLARITIN) 10 MG tablet Take 10 mg by mouth daily.    Marland Kitchen losartan (COZAAR) 100 MG tablet Take 1 tablet (100 mg total) by mouth daily. 90 tablet 3  . memantine (NAMENDA) 10 MG tablet Take 1 tablet (10 mg total) by mouth 2 (two) times daily. 180 tablet 0  . nebivolol (BYSTOLIC) 10 MG tablet Take 1 tablet (10 mg total) daily by mouth. 30 tablet 5  . potassium chloride SA (K-DUR,KLOR-CON) 20 MEQ tablet Take 20 mEq by mouth 2 (two) times daily.    . pravastatin (PRAVACHOL) 20 MG tablet Take 1 tablet (20 mg total) by mouth daily. 90 tablet 3  . sertraline (ZOLOFT) 100 MG tablet Take 1.5 tablets (150 mg total) daily by mouth. 135 tablet 1  . thiamine 100 MG tablet Take 1 tablet (100 mg total) by mouth daily.    Marland Kitchen triamcinolone cream (KENALOG) 0.1 % Compound 1:1 with Eucerin cream and apply bid as needed to affected rash 453 g 1  . vitamin B-12 (CYANOCOBALAMIN) 1000 MCG tablet Take 1 tablet (1,000 mcg total) by mouth daily.    Marland Kitchen  Vitamin D, Cholecalciferol, 1000 units TABS Take 1,000 Units by mouth daily.     No current facility-administered medications on file prior to visit.     Past Medical History:  Diagnosis Date  . Adenomatous colon polyp 05/26/1999   procedure at [facility] in [place], [place]   . Anemia   . ANXIETY   . DEGENERATIVE JOINT DISEASE, KNEES, BILATERAL   . Dementia ([place]) 08/31/2010   neuropsyc eval 10/2012 and follows with neuro  . Diverticulosis   . DYSLIPIDEMIA   . ETOH abuse   . Fibrocystic breast disease   . HEARING LOSS, BILATERAL   . Hemorrhoids   .  HERPES ZOSTER   . Hypertension   . Osteoporosis    DEXA A LB 09/22/14: -2.4 R fem, progressive decline since 2011    . PMR (polymyalgia rheumatica) (HCC) dx 10/2011   Initially dx GCA, then PMR clarified -on pred, follows with rheum  . VITAMIN B12 DEFICIENCY     Past Surgical History:  Procedure Laterality Date  . Bilateral Mastectomy     while removing cosmetic silicone implants (NO Breast cancer)  . Bilateral silicone Breast explant  1993   Due to rupture/leak @ [place] History   Socioeconomic History  . Marital status: Divorced    Spouse name: Not on file  . Number of children: Not on file  . Years of education: Not on file  . Highest education level: Not on file  Occupational History  . Not on file  Social Needs  . Financial resource strain: Not on file  . Food insecurity:    Worry: Not on file    Inability: Not on file  . Transportation needs:    Medical: Not on file    Non-medical: Not on file  Tobacco Use  . Smoking status: Never Smoker  . Smokeless tobacco: Never Used  Substance and Sexual Activity  . Alcohol use: Yes    Alcohol/week: 3.0 standard drinks    Types: 3 Glasses of wine per week    Comment: 02/2018 wine with dinner every night   . Drug use: No  . Sexual activity: Not Currently  Lifestyle  . Physical activity:    Days per week: Not on file    Minutes per session: Not on file  . Stress: Not on file  Relationships  . Social connections:    Talks on phone: Not on file    Gets together: Not on file    Attends religious service: Not on file    Active member of club or organization: Not on file    Attends meetings of clubs or organizations: Not on file    Relationship status: Not on file  Other Topics Concern  . Not on file  Social History Narrative   Divorced fall 2015, lives alone. adopted son nearby. Retired from public health-nutritionist    Family History  Problem Relation Age of Onset  . Pancreatic cancer Father   . Heart  disease Brother   . Colon cancer Paternal Grandfather   . Colon cancer Paternal Aunt   . Inflammatory bowel disease Cousin     Review of Systems     Objective:  There were no vitals filed for this visit. BP Readings from Last 3 Encounters:  04/25/18 138/64  03/05/18 (!) 120/45  12/06/17 (!) 156/82   Wt Readings from Last 3 Encounters:  04/25/18 109 lb (49.4 kg)  03/01/18 104 lb 8  oz (47.4 kg)  12/06/17 106 lb (48.1 kg)   There is no height or weight on file to calculate BMI.   Physical Exam    Constitutional: Appears well-developed and well-nourished. No distress.  HENT:  Head: Normocephalic and atraumatic.  Neck: Neck supple. No tracheal deviation present. No thyromegaly present.  No cervical lymphadenopathy Cardiovascular: Normal rate, regular rhythm and normal heart sounds.   No murmur heard. No carotid bruit .  No edema Pulmonary/Chest: Effort normal and breath sounds normal. No respiratory distress. No has no wheezes. No rales.  Skin: Skin is warm and dry. Not diaphoretic.  Psychiatric: Normal mood and affect. Behavior is normal.      Assessment & Plan:    See Problem List for Assessment and Plan of chronic medical problems.

## 2018-06-08 ENCOUNTER — Ambulatory Visit: Payer: Medicare Other | Admitting: Internal Medicine

## 2018-06-08 DIAGNOSIS — Z0289 Encounter for other administrative examinations: Secondary | ICD-10-CM

## 2019-09-22 IMAGING — CT CT CHEST W/O CM
2 of 3 series · 15 of 36 positions shown, 18 images · non-contrast
Comparison: 09/25/2007

CLINICAL DATA: Patient lost balance and fell this morning.
Suspicion of rib fractures.

EXAM:
CT CHEST WITHOUT CONTRAST
TECHNIQUE: Multidetector CT imaging of the chest was performed following the
standard protocol without IV contrast.

[Series 3: chest w/o 2mm st · axial · non-contrast · 0.81mm/px · z∈[-563,-241]mm · 12 of 190 slices shown, 15 images]
[im 15/190  mediastinal]
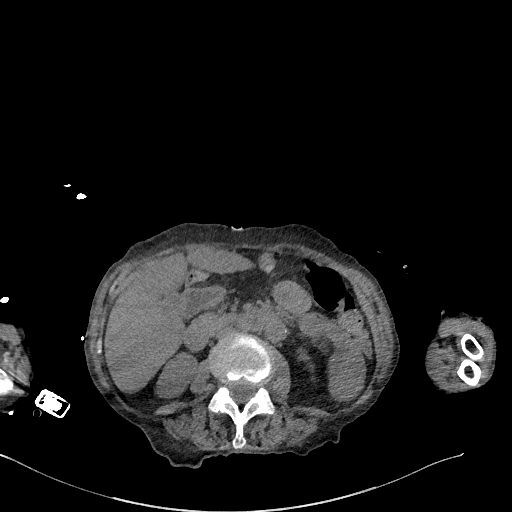
[im 15/190  lung]
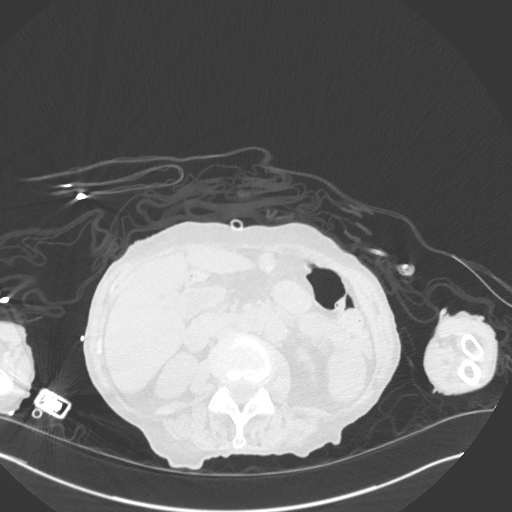
[im 29/190  lung]
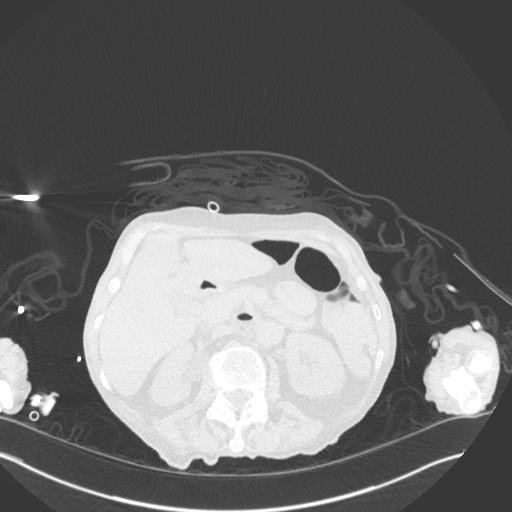
[im 43/190  lung]
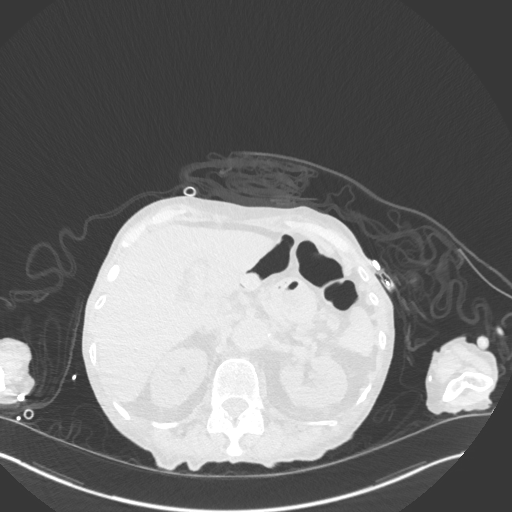
[im 57/190  lung]
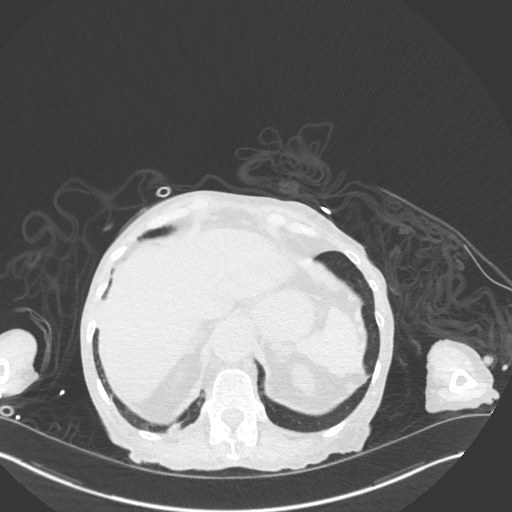
[im 71/190  mediastinal]
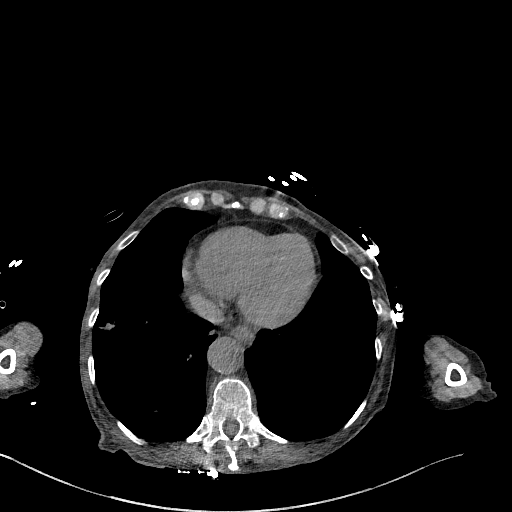
[im 71/190  lung]
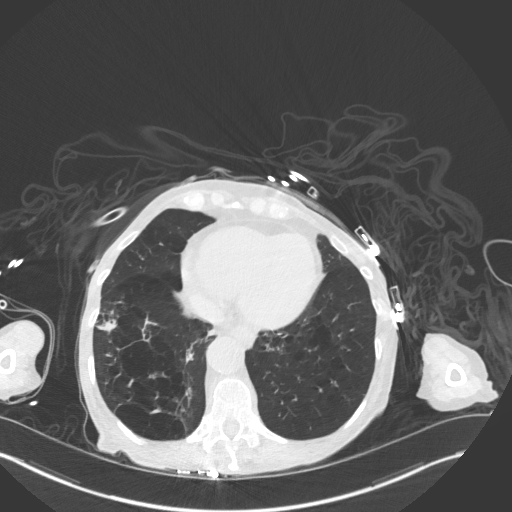
[im 85/190  lung]
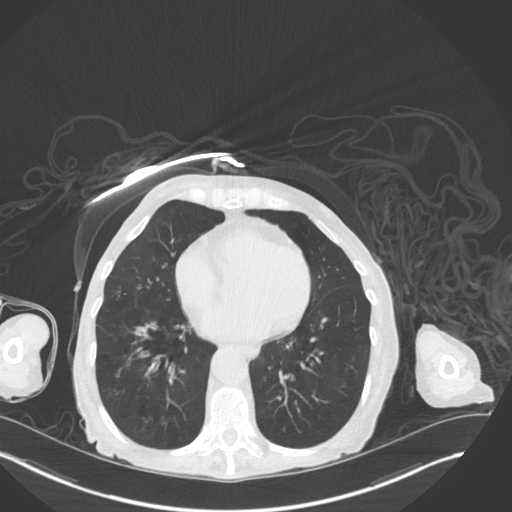
[im 106/190  lung]
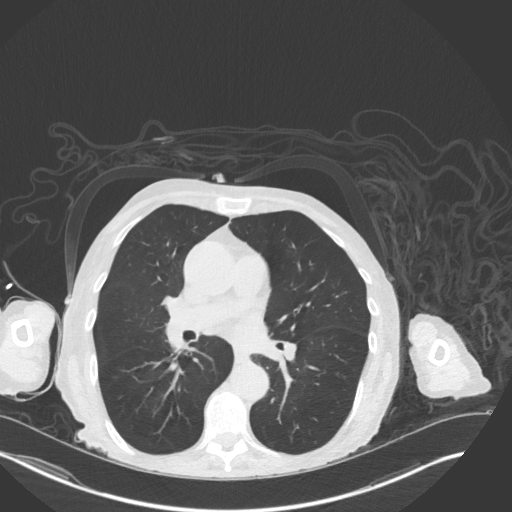
[im 120/190  lung]
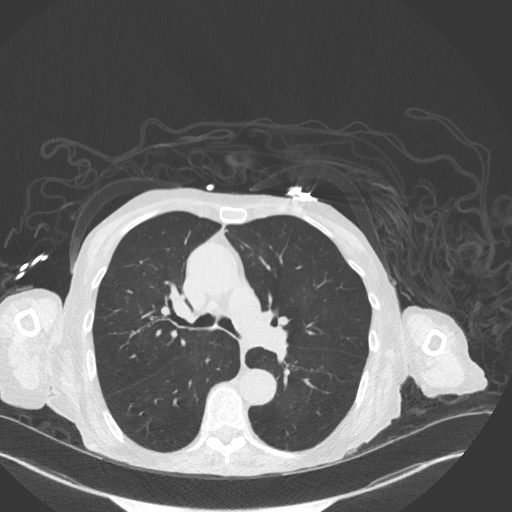
[im 134/190  mediastinal]
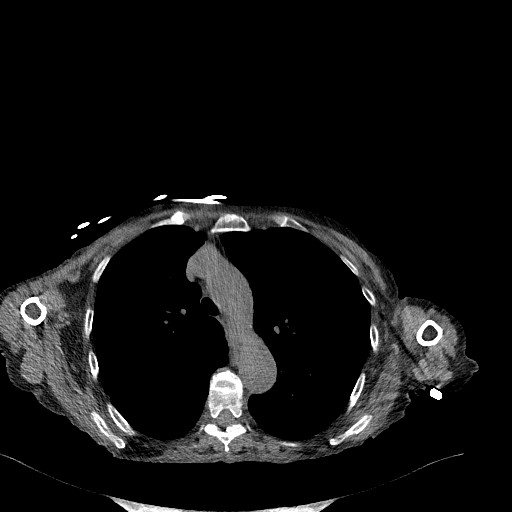
[im 134/190  lung]
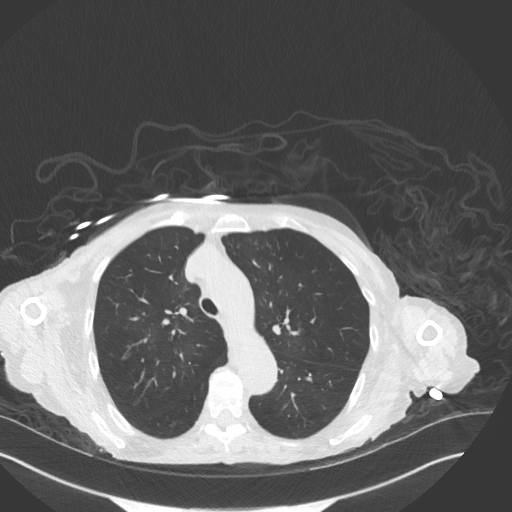
[im 148/190  lung]
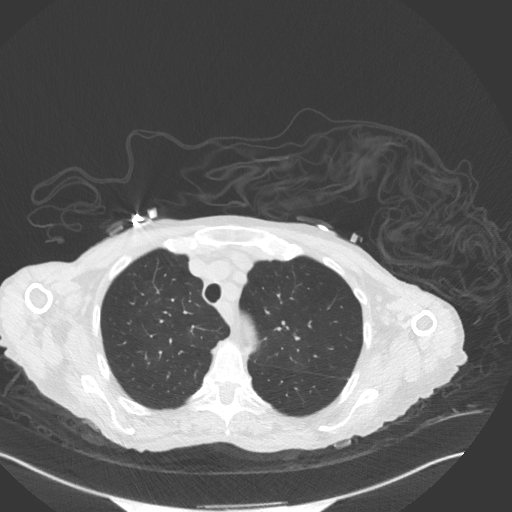
[im 162/190  lung]
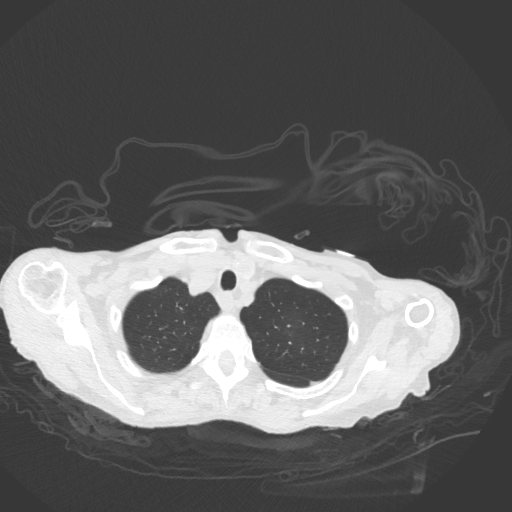
[im 176/190  lung]
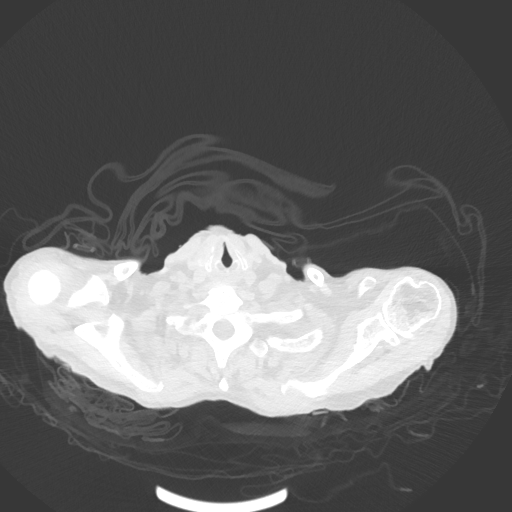

[Series 5: chest w/o 3mm st cor · coronal · non-contrast · 0.60mm/px · 3 of 91 slices shown]
[im 19/91  lung]
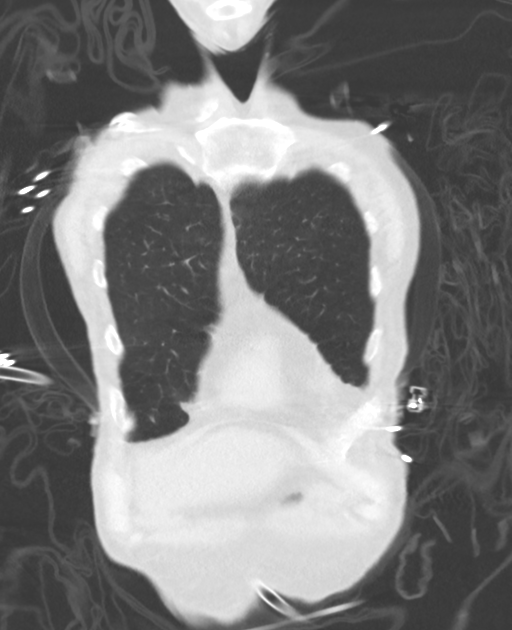
[im 37/91  lung]
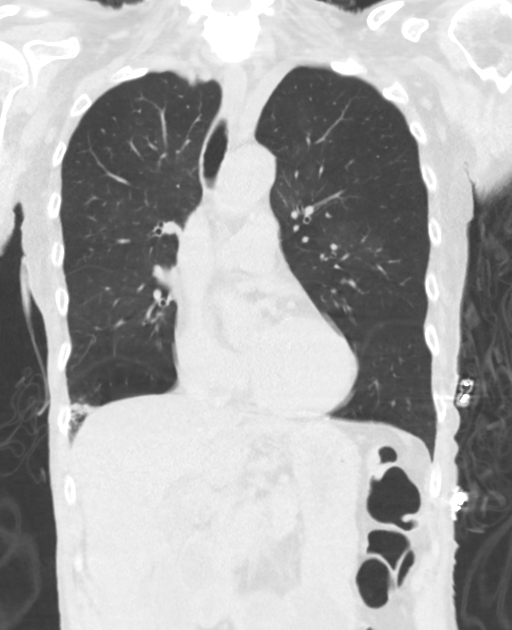
[im 55/91  lung]
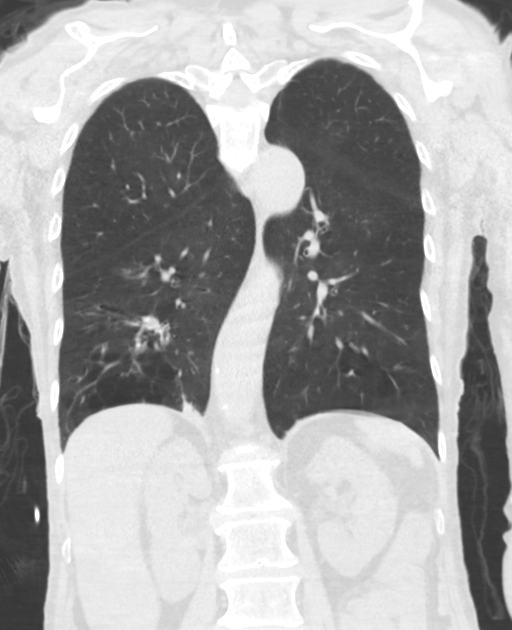

[15 of 36 positions shown; findings below may reference images not displayed]

FINDINGS: Cardiovascular: Normal heart size. No pericardial effusion. Coronary
artery calcifications. Dilated ascending thoracic aorta measuring
4.4 cm diameter. Scattered aortic calcifications.

Mediastinum/Nodes: No significant lymphadenopathy in the chest.
Esophagus is decompressed. 12 mm left thyroid gland nodule.

Lungs/Pleura: Pulmonary hyperinflation. Peribronchial thickening
with peribronchial streaky infiltrates in the right lung base. Focal
consolidation peripherally in the right lower lung at the
costophrenic angle. Appearance suggest bronchopneumonia. Left lung
is clear. No pleural effusions. No pneumothorax.

Upper Abdomen: No acute changes identified.

Musculoskeletal: Nondisplaced fracture of the right posterior 12 and
eleventh ribs at the costovertebral junction. Normal alignment of
the thoracic spine. No vertebral compression deformities. No
depressed sternal fractures.
IMPRESSION: 1. Nondisplaced fracture of the right posterior 12 and eleventh ribs
at the costovertebral junction.
2. Peribronchial thickening with peribronchial infiltrates in the
right lung base consistent with bronchopneumonia.
3. Dilated ascending thoracic aorta measuring 4.4 cm diameter.
Recommend annual imaging followup by CTA or MRA. This recommendation
follows 4393 ACCF/AHA/AATS/ACR/ASA/SCA/EA/ERASTO/SHALLEY/STOVER Guidelines
for the Diagnosis and Management of Patients with Thoracic Aortic
Disease. Circulation. 4393; 121: [id number].

Aortic Atherosclerosis (EIL9B-ETB.B) and Emphysema (EIL9B-JUO.9).
